# Patient Record
Sex: Female | Born: 1960 | Race: White | Hispanic: No | Smoking: Never smoker
Health system: Southern US, Community
[De-identification: ages and names within clinical notes are randomized; demographics above are authoritative.]

## PROBLEM LIST (undated history)

## (undated) DIAGNOSIS — T4145XA Adverse effect of unspecified anesthetic, initial encounter: Secondary | ICD-10-CM

## (undated) DIAGNOSIS — F329 Major depressive disorder, single episode, unspecified: Secondary | ICD-10-CM

## (undated) DIAGNOSIS — K7200 Acute and subacute hepatic failure without coma: Secondary | ICD-10-CM

## (undated) DIAGNOSIS — F32A Depression, unspecified: Secondary | ICD-10-CM

## (undated) DIAGNOSIS — Z8489 Family history of other specified conditions: Secondary | ICD-10-CM

## (undated) DIAGNOSIS — Z8709 Personal history of other diseases of the respiratory system: Secondary | ICD-10-CM

## (undated) DIAGNOSIS — R200 Anesthesia of skin: Secondary | ICD-10-CM

## (undated) DIAGNOSIS — M797 Fibromyalgia: Secondary | ICD-10-CM

## (undated) DIAGNOSIS — Z9889 Other specified postprocedural states: Secondary | ICD-10-CM

## (undated) DIAGNOSIS — F419 Anxiety disorder, unspecified: Secondary | ICD-10-CM

## (undated) DIAGNOSIS — G629 Polyneuropathy, unspecified: Secondary | ICD-10-CM

## (undated) DIAGNOSIS — D649 Anemia, unspecified: Secondary | ICD-10-CM

## (undated) DIAGNOSIS — R112 Nausea with vomiting, unspecified: Secondary | ICD-10-CM

## (undated) DIAGNOSIS — N289 Disorder of kidney and ureter, unspecified: Secondary | ICD-10-CM

## (undated) DIAGNOSIS — R0600 Dyspnea, unspecified: Secondary | ICD-10-CM

## (undated) DIAGNOSIS — D759 Disease of blood and blood-forming organs, unspecified: Secondary | ICD-10-CM

## (undated) DIAGNOSIS — Z9289 Personal history of other medical treatment: Secondary | ICD-10-CM

## (undated) DIAGNOSIS — R51 Headache: Secondary | ICD-10-CM

## (undated) DIAGNOSIS — R519 Headache, unspecified: Secondary | ICD-10-CM

## (undated) DIAGNOSIS — Z862 Personal history of diseases of the blood and blood-forming organs and certain disorders involving the immune mechanism: Secondary | ICD-10-CM

## (undated) DIAGNOSIS — M1990 Unspecified osteoarthritis, unspecified site: Secondary | ICD-10-CM

## (undated) HISTORY — PX: BACK SURGERY: SHX140

## (undated) HISTORY — PX: TONSILLECTOMY: SUR1361

## (undated) HISTORY — PX: DILATION AND CURETTAGE OF UTERUS: SHX78

## (undated) HISTORY — PX: CERVICAL DISCECTOMY: SHX98

## (undated) HISTORY — PX: SHOULDER SURGERY: SHX246

## (undated) HISTORY — PX: ABDOMINAL HYSTERECTOMY: SHX81

## (undated) HISTORY — DX: Depression, unspecified: F32.A

## (undated) HISTORY — DX: Major depressive disorder, single episode, unspecified: F32.9

## (undated) HISTORY — PX: OTHER SURGICAL HISTORY: SHX169

## (undated) HISTORY — DX: Disorder of kidney and ureter, unspecified: N28.9

---

## 1987-05-18 HISTORY — PX: FOOT SURGERY: SHX648

## 1997-11-16 ENCOUNTER — Emergency Department (HOSPITAL_COMMUNITY): Admission: EM | Admit: 1997-11-16 | Discharge: 1997-11-16 | Payer: Self-pay | Admitting: Emergency Medicine

## 1999-03-07 ENCOUNTER — Emergency Department (HOSPITAL_COMMUNITY): Admission: EM | Admit: 1999-03-07 | Discharge: 1999-03-07 | Payer: Self-pay | Admitting: Emergency Medicine

## 1999-07-05 ENCOUNTER — Ambulatory Visit (HOSPITAL_COMMUNITY): Admission: RE | Admit: 1999-07-05 | Discharge: 1999-07-05 | Payer: Self-pay | Admitting: Neurosurgery

## 1999-11-23 ENCOUNTER — Inpatient Hospital Stay (HOSPITAL_COMMUNITY): Admission: RE | Admit: 1999-11-23 | Discharge: 1999-11-27 | Payer: Self-pay | Admitting: Neurosurgery

## 1999-12-02 ENCOUNTER — Encounter: Payer: Self-pay | Admitting: Neurosurgery

## 1999-12-02 ENCOUNTER — Encounter: Payer: Self-pay | Admitting: Emergency Medicine

## 1999-12-02 ENCOUNTER — Inpatient Hospital Stay (HOSPITAL_COMMUNITY): Admission: EM | Admit: 1999-12-02 | Discharge: 1999-12-10 | Payer: Self-pay | Admitting: Emergency Medicine

## 2000-01-21 ENCOUNTER — Inpatient Hospital Stay (HOSPITAL_COMMUNITY): Admission: RE | Admit: 2000-01-21 | Discharge: 2000-01-23 | Payer: Self-pay | Admitting: Neurosurgery

## 2000-01-23 ENCOUNTER — Emergency Department (HOSPITAL_COMMUNITY): Admission: EM | Admit: 2000-01-23 | Discharge: 2000-01-23 | Payer: Self-pay | Admitting: Emergency Medicine

## 2000-03-15 ENCOUNTER — Ambulatory Visit (HOSPITAL_COMMUNITY): Admission: RE | Admit: 2000-03-15 | Discharge: 2000-03-15 | Payer: Self-pay | Admitting: Neurosurgery

## 2000-05-31 ENCOUNTER — Encounter: Admission: RE | Admit: 2000-05-31 | Discharge: 2000-05-31 | Payer: Self-pay | Admitting: Internal Medicine

## 2000-11-29 ENCOUNTER — Encounter: Admission: RE | Admit: 2000-11-29 | Discharge: 2000-11-29 | Payer: Self-pay | Admitting: Neurosurgery

## 2001-04-22 ENCOUNTER — Emergency Department (HOSPITAL_COMMUNITY): Admission: EM | Admit: 2001-04-22 | Discharge: 2001-04-23 | Payer: Self-pay | Admitting: Emergency Medicine

## 2001-04-23 ENCOUNTER — Encounter: Payer: Self-pay | Admitting: Emergency Medicine

## 2001-04-26 ENCOUNTER — Ambulatory Visit (HOSPITAL_COMMUNITY): Admission: RE | Admit: 2001-04-26 | Discharge: 2001-04-26 | Payer: Self-pay | Admitting: Family Medicine

## 2001-04-26 ENCOUNTER — Encounter: Payer: Self-pay | Admitting: Family Medicine

## 2001-05-19 ENCOUNTER — Ambulatory Visit (HOSPITAL_COMMUNITY): Admission: RE | Admit: 2001-05-19 | Discharge: 2001-05-19 | Payer: Self-pay | Admitting: Neurosurgery

## 2001-05-24 ENCOUNTER — Other Ambulatory Visit: Admission: RE | Admit: 2001-05-24 | Discharge: 2001-05-24 | Payer: Self-pay | Admitting: Obstetrics and Gynecology

## 2003-01-23 ENCOUNTER — Ambulatory Visit (HOSPITAL_COMMUNITY): Admission: RE | Admit: 2003-01-23 | Discharge: 2003-01-25 | Payer: Self-pay | Admitting: Neurosurgery

## 2004-02-27 ENCOUNTER — Ambulatory Visit (HOSPITAL_COMMUNITY): Admission: RE | Admit: 2004-02-27 | Discharge: 2004-02-27 | Payer: Self-pay | Admitting: Neurosurgery

## 2004-03-13 ENCOUNTER — Other Ambulatory Visit: Payer: Self-pay

## 2004-03-13 ENCOUNTER — Emergency Department: Payer: Self-pay | Admitting: Emergency Medicine

## 2004-03-14 ENCOUNTER — Other Ambulatory Visit: Payer: Self-pay

## 2004-03-30 ENCOUNTER — Ambulatory Visit (HOSPITAL_COMMUNITY): Admission: RE | Admit: 2004-03-30 | Discharge: 2004-03-31 | Payer: Self-pay | Admitting: Neurosurgery

## 2004-04-06 ENCOUNTER — Ambulatory Visit (HOSPITAL_COMMUNITY): Admission: RE | Admit: 2004-04-06 | Discharge: 2004-04-06 | Payer: Self-pay | Admitting: Neurosurgery

## 2004-04-13 ENCOUNTER — Ambulatory Visit (HOSPITAL_COMMUNITY): Admission: RE | Admit: 2004-04-13 | Discharge: 2004-04-13 | Payer: Self-pay | Admitting: Neurosurgery

## 2004-04-24 ENCOUNTER — Ambulatory Visit (HOSPITAL_COMMUNITY): Admission: RE | Admit: 2004-04-24 | Discharge: 2004-04-24 | Payer: Self-pay | Admitting: Neurosurgery

## 2004-06-17 ENCOUNTER — Encounter: Admission: RE | Admit: 2004-06-17 | Discharge: 2004-07-29 | Payer: Self-pay | Admitting: Neurosurgery

## 2004-07-08 ENCOUNTER — Ambulatory Visit (HOSPITAL_COMMUNITY): Admission: RE | Admit: 2004-07-08 | Discharge: 2004-07-08 | Payer: Self-pay | Admitting: Neurosurgery

## 2004-08-07 ENCOUNTER — Ambulatory Visit (HOSPITAL_COMMUNITY): Admission: RE | Admit: 2004-08-07 | Discharge: 2004-08-07 | Payer: Self-pay | Admitting: Neurosurgery

## 2004-08-12 ENCOUNTER — Encounter: Admission: RE | Admit: 2004-08-12 | Discharge: 2004-08-12 | Payer: Self-pay | Admitting: Neurosurgery

## 2004-08-27 ENCOUNTER — Ambulatory Visit (HOSPITAL_COMMUNITY): Admission: RE | Admit: 2004-08-27 | Discharge: 2004-08-27 | Payer: Self-pay | Admitting: Orthopedic Surgery

## 2004-09-10 ENCOUNTER — Inpatient Hospital Stay (HOSPITAL_COMMUNITY): Admission: RE | Admit: 2004-09-10 | Discharge: 2004-09-17 | Payer: Self-pay | Admitting: Neurosurgery

## 2004-12-21 ENCOUNTER — Encounter: Admission: RE | Admit: 2004-12-21 | Discharge: 2005-01-29 | Payer: Self-pay | Admitting: Orthopedic Surgery

## 2005-04-15 ENCOUNTER — Ambulatory Visit (HOSPITAL_COMMUNITY): Admission: RE | Admit: 2005-04-15 | Discharge: 2005-04-15 | Payer: Self-pay | Admitting: Neurosurgery

## 2005-05-24 ENCOUNTER — Ambulatory Visit: Payer: Self-pay | Admitting: Physical Medicine & Rehabilitation

## 2005-05-24 ENCOUNTER — Encounter
Admission: RE | Admit: 2005-05-24 | Discharge: 2005-08-22 | Payer: Self-pay | Admitting: Physical Medicine & Rehabilitation

## 2005-08-01 ENCOUNTER — Emergency Department: Payer: Self-pay | Admitting: Emergency Medicine

## 2006-08-30 ENCOUNTER — Ambulatory Visit (HOSPITAL_COMMUNITY): Admission: RE | Admit: 2006-08-30 | Discharge: 2006-08-30 | Payer: Self-pay | Admitting: Neurosurgery

## 2006-09-29 ENCOUNTER — Ambulatory Visit (HOSPITAL_COMMUNITY): Admission: RE | Admit: 2006-09-29 | Discharge: 2006-09-29 | Payer: Self-pay | Admitting: Neurosurgery

## 2007-01-11 ENCOUNTER — Inpatient Hospital Stay (HOSPITAL_COMMUNITY): Admission: RE | Admit: 2007-01-11 | Discharge: 2007-01-16 | Payer: Self-pay | Admitting: Neurosurgery

## 2007-07-28 ENCOUNTER — Encounter: Admission: RE | Admit: 2007-07-28 | Discharge: 2007-07-28 | Payer: Self-pay | Admitting: Neurosurgery

## 2008-01-25 ENCOUNTER — Ambulatory Visit (HOSPITAL_COMMUNITY): Admission: RE | Admit: 2008-01-25 | Discharge: 2008-01-25 | Payer: Self-pay | Admitting: Neurosurgery

## 2008-03-25 ENCOUNTER — Ambulatory Visit: Payer: Self-pay | Admitting: Hematology & Oncology

## 2008-05-14 ENCOUNTER — Ambulatory Visit: Payer: Self-pay | Admitting: Hematology & Oncology

## 2008-05-15 LAB — CBC WITH DIFFERENTIAL (CANCER CENTER ONLY)
BASO#: 0.1 10*3/uL (ref 0.0–0.2)
BASO%: 0.5 % (ref 0.0–2.0)
EOS%: 2.9 % (ref 0.0–7.0)
HCT: 22.9 % — ABNORMAL LOW (ref 34.8–46.6)
HGB: 7 g/dL — CL (ref 11.6–15.9)
MCH: 18.3 pg — ABNORMAL LOW (ref 26.0–34.0)
MCHC: 30.5 g/dL — ABNORMAL LOW (ref 32.0–36.0)
MONO%: 10.1 % (ref 0.0–13.0)
NEUT%: 57 % (ref 39.6–80.0)
RDW: 16.9 % — ABNORMAL HIGH (ref 10.5–14.6)

## 2008-05-16 LAB — RETICULOCYTES (CHCC)
ABS Retic: 51.1 10*3/uL (ref 19.0–186.0)
RBC.: 3.93 MIL/uL (ref 3.87–5.11)

## 2008-05-16 LAB — COMPREHENSIVE METABOLIC PANEL
ALT: 18 U/L (ref 0–35)
AST: 22 U/L (ref 0–37)
Albumin: 3.8 g/dL (ref 3.5–5.2)
Alkaline Phosphatase: 124 U/L — ABNORMAL HIGH (ref 39–117)
Calcium: 8.4 mg/dL (ref 8.4–10.5)
Chloride: 98 mEq/L (ref 96–112)
Potassium: 3.3 mEq/L — ABNORMAL LOW (ref 3.5–5.3)
Sodium: 133 mEq/L — ABNORMAL LOW (ref 135–145)

## 2008-05-16 LAB — LACTATE DEHYDROGENASE: LDH: 184 U/L (ref 94–250)

## 2008-05-16 LAB — VITAMIN B12: Vitamin B-12: 345 pg/mL (ref 211–911)

## 2008-05-17 HISTORY — PX: OTHER SURGICAL HISTORY: SHX169

## 2008-07-10 ENCOUNTER — Ambulatory Visit: Payer: Self-pay | Admitting: Hematology & Oncology

## 2008-07-11 LAB — CBC WITH DIFFERENTIAL (CANCER CENTER ONLY)
BASO#: 0.1 10*3/uL (ref 0.0–0.2)
BASO%: 0.7 % (ref 0.0–2.0)
EOS%: 3 % (ref 0.0–7.0)
HCT: 36.7 % (ref 34.8–46.6)
HGB: 11.5 g/dL — ABNORMAL LOW (ref 11.6–15.9)
LYMPH#: 2.9 10*3/uL (ref 0.9–3.3)
LYMPH%: 36.1 % (ref 14.0–48.0)
MCHC: 31.3 g/dL — ABNORMAL LOW (ref 32.0–36.0)
MCV: 80 fL — ABNORMAL LOW (ref 81–101)
MONO#: 0.9 10*3/uL (ref 0.1–0.9)
NEUT%: 48.9 % (ref 39.6–80.0)
RDW: 21.4 % — ABNORMAL HIGH (ref 10.5–14.6)

## 2008-07-11 LAB — CHCC SATELLITE - SMEAR

## 2008-07-14 LAB — RETICULOCYTES (CHCC)
ABS Retic: 51.7 10*3/uL (ref 19.0–186.0)
RBC.: 4.7 MIL/uL (ref 3.87–5.11)
Retic Ct Pct: 1.1 % (ref 0.4–3.1)

## 2008-07-16 ENCOUNTER — Ambulatory Visit (HOSPITAL_COMMUNITY): Admission: RE | Admit: 2008-07-16 | Discharge: 2008-07-16 | Payer: Self-pay | Admitting: Obstetrics and Gynecology

## 2008-08-15 ENCOUNTER — Ambulatory Visit (HOSPITAL_COMMUNITY): Admission: RE | Admit: 2008-08-15 | Discharge: 2008-08-15 | Payer: Self-pay | Admitting: Obstetrics and Gynecology

## 2008-08-15 ENCOUNTER — Encounter (INDEPENDENT_AMBULATORY_CARE_PROVIDER_SITE_OTHER): Payer: Self-pay | Admitting: Obstetrics and Gynecology

## 2008-09-04 ENCOUNTER — Ambulatory Visit: Payer: Self-pay | Admitting: Hematology & Oncology

## 2008-09-06 ENCOUNTER — Ambulatory Visit (HOSPITAL_COMMUNITY): Admission: RE | Admit: 2008-09-06 | Discharge: 2008-09-06 | Payer: Self-pay | Admitting: Neurosurgery

## 2008-12-30 ENCOUNTER — Inpatient Hospital Stay (HOSPITAL_COMMUNITY): Admission: RE | Admit: 2008-12-30 | Discharge: 2009-01-01 | Payer: Self-pay | Admitting: Neurosurgery

## 2009-01-03 ENCOUNTER — Inpatient Hospital Stay (HOSPITAL_COMMUNITY): Admission: EM | Admit: 2009-01-03 | Discharge: 2009-01-05 | Payer: Self-pay | Admitting: Emergency Medicine

## 2009-01-03 ENCOUNTER — Ambulatory Visit: Payer: Self-pay | Admitting: Hematology & Oncology

## 2009-05-17 DIAGNOSIS — T8859XA Other complications of anesthesia, initial encounter: Secondary | ICD-10-CM

## 2009-05-17 HISTORY — PX: HERNIA REPAIR: SHX51

## 2009-05-17 HISTORY — DX: Other complications of anesthesia, initial encounter: T88.59XA

## 2009-06-25 ENCOUNTER — Emergency Department: Payer: Self-pay | Admitting: Emergency Medicine

## 2009-07-10 ENCOUNTER — Ambulatory Visit: Payer: Self-pay | Admitting: Family Medicine

## 2009-07-18 ENCOUNTER — Encounter (INDEPENDENT_AMBULATORY_CARE_PROVIDER_SITE_OTHER): Payer: Self-pay | Admitting: Orthopedic Surgery

## 2009-07-18 ENCOUNTER — Ambulatory Visit: Payer: Self-pay | Admitting: Vascular Surgery

## 2009-07-18 ENCOUNTER — Ambulatory Visit (HOSPITAL_COMMUNITY): Admission: RE | Admit: 2009-07-18 | Discharge: 2009-07-18 | Payer: Self-pay | Admitting: Family Medicine

## 2009-07-25 ENCOUNTER — Encounter: Payer: Self-pay | Admitting: Cardiovascular Disease

## 2009-09-18 ENCOUNTER — Ambulatory Visit (HOSPITAL_COMMUNITY): Admission: RE | Admit: 2009-09-18 | Discharge: 2009-09-18 | Payer: Self-pay | Admitting: Gastroenterology

## 2009-12-02 ENCOUNTER — Ambulatory Visit (HOSPITAL_COMMUNITY): Admission: RE | Admit: 2009-12-02 | Discharge: 2009-12-02 | Payer: Self-pay | Admitting: General Surgery

## 2010-03-04 ENCOUNTER — Encounter: Admission: RE | Admit: 2010-03-04 | Discharge: 2010-03-04 | Payer: Self-pay | Admitting: Neurosurgery

## 2010-06-06 ENCOUNTER — Encounter: Payer: Self-pay | Admitting: Neurosurgery

## 2010-07-08 ENCOUNTER — Ambulatory Visit (HOSPITAL_COMMUNITY)
Admission: RE | Admit: 2010-07-08 | Discharge: 2010-07-08 | Disposition: A | Discharge status: Home / Self Care | Payer: Medicare Other | Source: Ambulatory Visit | Attending: Family Medicine | Admitting: Family Medicine

## 2010-07-08 DIAGNOSIS — R0602 Shortness of breath: Secondary | ICD-10-CM | POA: Insufficient documentation

## 2010-07-29 ENCOUNTER — Inpatient Hospital Stay (HOSPITAL_COMMUNITY)
Admission: AD | Admit: 2010-07-29 | Discharge: 2010-07-30 | Disposition: A | Discharge status: Home / Self Care | Payer: Medicare Other | Source: Ambulatory Visit | Attending: Obstetrics and Gynecology | Admitting: Obstetrics and Gynecology

## 2010-07-29 DIAGNOSIS — N949 Unspecified condition associated with female genital organs and menstrual cycle: Secondary | ICD-10-CM

## 2010-07-29 DIAGNOSIS — Z30432 Encounter for removal of intrauterine contraceptive device: Secondary | ICD-10-CM

## 2010-08-01 LAB — DIFFERENTIAL
Basophils Relative: 1 % (ref 0–1)
Eosinophils Absolute: 0.2 10*3/uL (ref 0.0–0.7)
Monocytes Absolute: 1.4 10*3/uL — ABNORMAL HIGH (ref 0.1–1.0)
Monocytes Relative: 13 % — ABNORMAL HIGH (ref 3–12)
Neutrophils Relative %: 64 % (ref 43–77)

## 2010-08-01 LAB — CBC
HCT: 36.7 % (ref 36.0–46.0)
Hemoglobin: 11.8 g/dL — ABNORMAL LOW (ref 12.0–15.0)
MCH: 25.8 pg — ABNORMAL LOW (ref 26.0–34.0)
MCHC: 32.3 g/dL (ref 30.0–36.0)
RDW: 14.1 % (ref 11.5–15.5)

## 2010-08-01 LAB — HCG, SERUM, QUALITATIVE: Preg, Serum: NEGATIVE

## 2010-08-01 LAB — BASIC METABOLIC PANEL
BUN: 10 mg/dL (ref 6–23)
CO2: 31 mEq/L (ref 19–32)
Calcium: 8.7 mg/dL (ref 8.4–10.5)
Glucose, Bld: 94 mg/dL (ref 70–99)
Sodium: 139 mEq/L (ref 135–145)

## 2010-08-01 LAB — HEPATIC FUNCTION PANEL
Bilirubin, Direct: 0.2 mg/dL (ref 0.0–0.3)
Total Bilirubin: 0.3 mg/dL (ref 0.3–1.2)

## 2010-08-01 LAB — SURGICAL PCR SCREEN: Staphylococcus aureus: NEGATIVE

## 2010-08-05 ENCOUNTER — Other Ambulatory Visit: Payer: Self-pay | Admitting: Hematology & Oncology

## 2010-08-05 ENCOUNTER — Encounter (HOSPITAL_BASED_OUTPATIENT_CLINIC_OR_DEPARTMENT_OTHER): Payer: Medicare Other | Admitting: Hematology & Oncology

## 2010-08-05 DIAGNOSIS — N920 Excessive and frequent menstruation with regular cycle: Secondary | ICD-10-CM

## 2010-08-05 DIAGNOSIS — D509 Iron deficiency anemia, unspecified: Secondary | ICD-10-CM

## 2010-08-05 DIAGNOSIS — D638 Anemia in other chronic diseases classified elsewhere: Secondary | ICD-10-CM

## 2010-08-05 LAB — CBC WITH DIFFERENTIAL (CANCER CENTER ONLY)
BASO#: 0 10*3/uL (ref 0.0–0.2)
EOS%: 2.3 % (ref 0.0–7.0)
Eosinophils Absolute: 0.2 10*3/uL (ref 0.0–0.5)
HGB: 8.7 g/dL — ABNORMAL LOW (ref 11.6–15.9)
LYMPH#: 2 10*3/uL (ref 0.9–3.3)
MCHC: 30.2 g/dL — ABNORMAL LOW (ref 32.0–36.0)
NEUT#: 4.6 10*3/uL (ref 1.5–6.5)
RBC: 3.81 10*6/uL (ref 3.70–5.32)
WBC: 7.8 10*3/uL (ref 3.9–10.0)

## 2010-08-10 ENCOUNTER — Telehealth: Payer: Self-pay | Admitting: Emergency Medicine

## 2010-08-10 ENCOUNTER — Encounter (HOSPITAL_COMMUNITY)
Admission: RE | Admit: 2010-08-10 | Discharge: 2010-08-10 | Disposition: A | Discharge status: Home / Self Care | Payer: Medicare Other | Source: Ambulatory Visit | Attending: Obstetrics and Gynecology | Admitting: Obstetrics and Gynecology

## 2010-08-10 DIAGNOSIS — Z01810 Encounter for preprocedural cardiovascular examination: Secondary | ICD-10-CM | POA: Insufficient documentation

## 2010-08-10 DIAGNOSIS — Z01812 Encounter for preprocedural laboratory examination: Secondary | ICD-10-CM | POA: Insufficient documentation

## 2010-08-10 LAB — SURGICAL PCR SCREEN
MRSA, PCR: NEGATIVE
Staphylococcus aureus: NEGATIVE

## 2010-08-10 NOTE — Telephone Encounter (Signed)
LMOM for [name] TCB

## 2010-08-11 NOTE — Telephone Encounter (Signed)
lmomtcb  Mindy Silva, CMA  

## 2010-08-11 NOTE — Telephone Encounter (Signed)
Spoke with Clydie Braun.  Per Clydie Braun, pt scheduled for LAVH and BSO on April 3.  States anesthesiologist is requesting pulmonary referral.  Has pending consult with Dr. Delton Coombes but he is not back in the office until after surgery is scheduled for.  Per TD, may add on to Dr. Thurston Hole schedule on 08/13/10 at 9:15.    Clydie Braun aware pt will be worked in with Dr. Sherene Sires on March 29 at 9:15.  She is aware to ask pt to arrive 15 minutes early, bring ALL medications (not a list) - as needed and maintance to appt.  She will inform pt of this. She will also fax records to main fax # - Attn: Leslie/Dr. Sherene Sires.

## 2010-08-11 NOTE — Telephone Encounter (Signed)
Clydie Braun returning call again.  She asked if you get her voicemail again please press  0 and have her paged.

## 2010-08-11 NOTE — Telephone Encounter (Signed)
[name] returning triage's call.

## 2010-08-13 ENCOUNTER — Other Ambulatory Visit (INDEPENDENT_AMBULATORY_CARE_PROVIDER_SITE_OTHER): Payer: Medicare Other

## 2010-08-13 ENCOUNTER — Ambulatory Visit (INDEPENDENT_AMBULATORY_CARE_PROVIDER_SITE_OTHER): Payer: Medicare Other | Admitting: Internal Medicine

## 2010-08-13 ENCOUNTER — Encounter: Payer: Self-pay | Admitting: Internal Medicine

## 2010-08-13 ENCOUNTER — Ambulatory Visit (INDEPENDENT_AMBULATORY_CARE_PROVIDER_SITE_OTHER)
Admission: RE | Admit: 2010-08-13 | Discharge: 2010-08-13 | Disposition: A | Discharge status: Home / Self Care | Payer: Medicare Other | Source: Ambulatory Visit | Attending: Internal Medicine | Admitting: Internal Medicine

## 2010-08-13 DIAGNOSIS — F329 Major depressive disorder, single episode, unspecified: Secondary | ICD-10-CM

## 2010-08-13 DIAGNOSIS — R0602 Shortness of breath: Secondary | ICD-10-CM

## 2010-08-13 DIAGNOSIS — N926 Irregular menstruation, unspecified: Secondary | ICD-10-CM

## 2010-08-13 DIAGNOSIS — F32A Depression, unspecified: Secondary | ICD-10-CM | POA: Insufficient documentation

## 2010-08-13 DIAGNOSIS — N939 Abnormal uterine and vaginal bleeding, unspecified: Secondary | ICD-10-CM

## 2010-08-13 DIAGNOSIS — F3289 Other specified depressive episodes: Secondary | ICD-10-CM

## 2010-08-13 DIAGNOSIS — D649 Anemia, unspecified: Secondary | ICD-10-CM

## 2010-08-13 DIAGNOSIS — N289 Disorder of kidney and ureter, unspecified: Secondary | ICD-10-CM

## 2010-08-13 LAB — BRAIN NATRIURETIC PEPTIDE: Pro B Natriuretic peptide (BNP): 135.8 pg/mL — ABNORMAL HIGH (ref 0.0–100.0)

## 2010-08-13 LAB — BASIC METABOLIC PANEL
Calcium: 8.1 mg/dL — ABNORMAL LOW (ref 8.4–10.5)
Creatinine, Ser: 0.7 mg/dL (ref 0.4–1.2)
GFR: 97.93 mL/min (ref >60.00)

## 2010-08-13 LAB — TSH: TSH: 4.47 u[IU]/mL (ref 0.35–5.50)

## 2010-08-13 MED ORDER — FAMOTIDINE 20 MG PO TABS: 20.0000 mg | ORAL_TABLET | Freq: Every day | ORAL | Status: DC

## 2010-08-13 MED ORDER — PANTOPRAZOLE SODIUM 40 MG PO TBEC: 40.0000 mg | DELAYED_RELEASE_TABLET | Freq: Every day | ORAL | Status: DC

## 2010-08-13 NOTE — Patient Instructions (Addendum)
Try protonix (pantoprazole) Take 30-60 min before first meal of the day and Pepcid 20 mg at bedtime  GERD (REFLUX)  is an extremely common cause of respiratory symptoms, many times with no significant heartburn at all.    It can be treated with medication, but also with lifestyle changes including avoidance of late meals, excessive alcohol, smoking cessation, and avoid fatty foods, chocolate, peppermint, colas, red wine, and acidic juices such as orange juice.  NO MINT OR MENTHOL PRODUCTS SO NO COUGH DROPS  USE SUGARLESS CANDY INSTEAD (jolley ranchers or Stover's)  NO OIL BASED VITAMINS    We will call you to schedule CT Chest  > will approve you for surgery p we review this  There is no pulmonary contraindication to surgery but you must be mobilized as soon as possible with maximum efforts to reduce effects of weight on your diaphragm and prevent blood clots

## 2010-08-13 NOTE — Progress Notes (Signed)
  Subjective:    Patient ID: [name], female    DOB: [birth year], 50 y.o.   MRN: [id number]  HPI 57 yowf never smoker with h/o hives to bees and fish "throat closes up" rx with epipen in past.  08/13/2010 Initial pulmonary office eval  For ex tol good in 1992 with back surgery eventually disabled in 2008 new cc 8 months indolent onset doe  X  Walking across trailer 75 ft and also 3 months sob sitting still and lying down, better sitting if leaning forward.  Better on albuterol tablets.  No assoc cough.  No major change in wt related to new symptoms. Cardiac w/u neg per Nasher per pt.   Pt denies any significant sore throat, dysphagia, itching, sneezing,  nasal congestion or excess/ purulent secretions,  fever, chills, sweats, unintended wt loss, pleuritic or exertional cp, hempoptysis, orthopnea pnd or leg swelling.    Also denies any obvious fluctuation of symptoms with weather or environmental changes or other aggravating or alleviating factors.   Hgb varies but does not correlate with doe     PMHx "Bone Marrow Dz" .......................................................Marland KitchenEnnever Morbid Obeisty        -  PFT's 07/08/10  VC  63%  DLC0 89% Unexplained sob .......................................................Marland KitchenWert       - Desats < 50 ft 08/13/2010 > CT Chest ordered >>>   Review of Systems  Constitutional: Positive for unexpected weight change. Negative for fever and chills.  HENT: Negative for ear pain, nosebleeds, congestion, sore throat, rhinorrhea, sneezing, trouble swallowing, dental problem, voice change, postnasal drip and sinus pressure.   Eyes: Negative for visual disturbance.  Respiratory: Positive for shortness of breath. Negative for cough and choking.   Cardiovascular: Negative for chest pain and leg swelling.  Gastrointestinal: Negative for vomiting, abdominal pain and diarrhea.  Genitourinary: Negative for difficulty urinating.  Musculoskeletal: Positive for  arthralgias.  Skin: Negative for rash.  Neurological: Negative for tremors, syncope and headaches.  Hematological: Bruises/bleeds easily.  Psychiatric/Behavioral: The patient is nervous/anxious.        Objective:   Physical Exam  Obese anxious wf slt pale, nad.  Wt 284 08/13/2010   HEENT: nl dentition, turbinates, and orophanx. Nl external ear canals without cough reflex   NECK :  without JVD/Nodes/TM/ nl carotid upstrokes bilaterally   LUNGS: no acc muscle use, clear to A and P bilaterally without cough on insp or exp maneuvers   CV:  RRR  no s3 or murmur or increase in P2, no edema   ABD: massively obese but soft and nontender with very limited  excursion in the supine position. No bruits or organomegaly, bowel sounds nl  MS:  warm without deformities, calf tenderness, cyanosis or clubbing  SKIN: warm and dry without lesions    NEURO:  alert, approp, no deficits       Assessment & Plan:  Nl bmet , bnp and tsh 08/13/2010  CT angiogram of chest ordered

## 2010-08-13 NOTE — Assessment & Plan Note (Addendum)
Restrictive pft's 06/2010 reviewed with pt and risk of surgery noted

## 2010-08-13 NOTE — Assessment & Plan Note (Addendum)
Mostly obesity related but high risk gerd and pe  Discussed in detail all the  indications, usual  risks and alternatives  relative to the benefits with patient who agrees to proceed with surgery unless acute findings on ct chest.    In meantime go ahead and try gerd rx/ diet.   The differential diagnosis of difficult to control airways disorders is extensive with no quick   answers but easy to remember because it consists of 11 A's,  Two Bs  And a C 1. Adherence, always a challenge and the leading suspect.  2. Acid reflux disease, with the greater proportion of pulmonary patients with no overt heartburn symptoms, and no easy way to treat non-acid reflux >>> so go ahead and start rx here. 3. Ace inhibitor use, not relevant here 4. Active sinus dz, best addressed by a sinus ct 5. Active smoking,  Usually sureptitious in this setting 6. Allergic diseases,   more of a challenge in younger patients and assoc with prominent allergic rhinitis features missing here  7. Aspiration, a perennial problem in the elderly or other patients at risk 8. 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 Alpha one Antitrypsin deficiency, a must screen in patients with chronic airflow obstruction syndromes out of proportion to smoking history. 10.Adverse effect of inhalers, especially DPI's and especially with poor inhaler technique >not relevant here. 11 Anxiety, always a diagnosis of exclusion but may be partly relevant here.  Two B's Bronchiectasis and Beta blocker effects One C Congestive heart failure, nicely ruled out now with BNP minimally above 100 and neg w/u by Dr Melburn Popper

## 2010-08-13 NOTE — Progress Notes (Signed)
Quick Note:  Pt aware of results/recs per MW. Pt verbalized understanding. ______

## 2010-08-14 ENCOUNTER — Encounter: Payer: Self-pay | Admitting: Internal Medicine

## 2010-08-14 ENCOUNTER — Telehealth: Payer: Self-pay | Admitting: Internal Medicine

## 2010-08-14 ENCOUNTER — Ambulatory Visit (INDEPENDENT_AMBULATORY_CARE_PROVIDER_SITE_OTHER)
Admission: RE | Admit: 2010-08-14 | Discharge: 2010-08-14 | Disposition: A | Discharge status: Home / Self Care | Payer: Medicare Other | Source: Ambulatory Visit | Attending: Internal Medicine | Admitting: Internal Medicine

## 2010-08-14 DIAGNOSIS — R0602 Shortness of breath: Secondary | ICD-10-CM

## 2010-08-14 LAB — CBC
Platelets: 250 10*3/uL (ref 150–400)
RDW: 19.2 % — ABNORMAL HIGH (ref 11.5–15.5)
WBC: 7.8 10*3/uL (ref 4.0–10.5)

## 2010-08-14 LAB — COMPREHENSIVE METABOLIC PANEL
ALT: 17 U/L (ref 0–35)
Alkaline Phosphatase: 75 U/L (ref 39–117)
BUN: 6 mg/dL (ref 6–23)
CO2: 26 mEq/L (ref 19–32)
Chloride: 105 mEq/L (ref 96–112)
GFR calc non Af Amer: >60 mL/min (ref >60)
Glucose, Bld: 108 mg/dL — ABNORMAL HIGH (ref 70–99)
Potassium: 3.2 mEq/L — ABNORMAL LOW (ref 3.5–5.1)
Sodium: 137 mEq/L (ref 135–145)
Total Bilirubin: 0.4 mg/dL (ref 0.3–1.2)
Total Protein: 7 g/dL (ref 6.0–8.3)

## 2010-08-14 LAB — TYPE AND SCREEN
ABO/RH(D): A POS
Antibody Screen: NEGATIVE

## 2010-08-14 MED ORDER — IOHEXOL 300 MG/ML  SOLN
150.0000 mL | Freq: Once | INTRAMUSCULAR | Status: AC | PRN
  Administered 2010-08-14: 150 mL via INTRAVENOUS

## 2010-08-14 NOTE — Telephone Encounter (Signed)
Spoke with pt and she is requesting CT chest results. I advised that we will call her once this is read.  She verbalized understanding.  She wants to know why Dr. Sherene Sires did not start her on o2 at ov and if he thinks this might help.  Please advise thanks

## 2010-08-14 NOTE — Telephone Encounter (Signed)
Called pt - issue for right now is whether ok to do surgery - need recheck cbc and esr and review cardiac eval to decide re safety of surgery so asked here to return 08/17/10 for this

## 2010-08-14 NOTE — Telephone Encounter (Signed)
Per Bjorn Loser nothing to eat or drink 2 hrs prior to ct being done.  LMOMTCB inform pt of this

## 2010-08-14 NOTE — Telephone Encounter (Signed)
Pt states she doesn't need a return call.

## 2010-08-17 ENCOUNTER — Ambulatory Visit (INDEPENDENT_AMBULATORY_CARE_PROVIDER_SITE_OTHER): Payer: Medicare Other | Admitting: Internal Medicine

## 2010-08-17 ENCOUNTER — Other Ambulatory Visit (INDEPENDENT_AMBULATORY_CARE_PROVIDER_SITE_OTHER): Payer: Medicare Other

## 2010-08-17 ENCOUNTER — Encounter: Payer: Self-pay | Admitting: Internal Medicine

## 2010-08-17 VITALS — BP 116/76 | HR 83 | Temp 97.9°F

## 2010-08-17 DIAGNOSIS — R0602 Shortness of breath: Secondary | ICD-10-CM

## 2010-08-17 LAB — CBC WITH DIFFERENTIAL/PLATELET
Basophils Absolute: 0.1 10*3/uL (ref 0.0–0.1)
Eosinophils Relative: 2.5 % (ref 0.0–5.0)
HCT: 29.8 % — ABNORMAL LOW (ref 36.0–46.0)
Lymphs Abs: 1.6 10*3/uL (ref 0.7–4.0)
Monocytes Absolute: 0.8 10*3/uL (ref 0.1–1.0)
Monocytes Relative: 11.1 % (ref 3.0–12.0)
Neutrophils Relative %: 63.1 % (ref 43.0–77.0)
Platelets: 257 10*3/uL (ref 150.0–400.0)
RDW: 20.5 % — ABNORMAL HIGH (ref 11.5–14.6)
WBC: 7.1 10*3/uL (ref 4.5–10.5)

## 2010-08-17 LAB — SEDIMENTATION RATE: Sed Rate: 49 mm/hr — ABNORMAL HIGH (ref 0–22)

## 2010-08-17 MED ORDER — FUROSEMIDE 20 MG PO TABS: 20.0000 mg | ORAL_TABLET | Freq: Every day | ORAL | Status: DC

## 2010-08-17 NOTE — Patient Instructions (Signed)
Because you are short of breath at rest we need to cancel your surgery for now  We will call you with your lab results  Use Incentive spirometry every hour while awake  Return in one week for a cxr with your incentive spirometer in hand

## 2010-08-17 NOTE — Progress Notes (Signed)
  Subjective:    Patient ID: [name], female    DOB: 14-Jan-1961, 50 y.o.   MRN: [id number]  Shortness of Breath Pertinent negatives include no abdominal pain, chest pain, ear pain, fever, headaches, leg swelling, rash, rhinorrhea, sore throat or vomiting.   49 yowf never smoker with h/o hives to bees and fish "throat closes up" rx with epipen in past.  08/13/10 Initial  pulmonary office eval  For ex tol good in 1992 with back surgery eventually disabled in 2008 new cc 8 months indolent onset doe  X  Walking across trailer 75 ft and also 3 months sob sitting still and lying down, better sitting if leaning forward.  Better on albuterol tablets.  No assoc cough.  No major change in wt related to new symptoms. Cardiac w/u neg per Nasher per pt.      Hgb varies but does not correlate with doe    08/17/2010 ov to discuss ct and eval for surgery.  No cough.  Denies any acute changes in sob.  Pt denies any significant sore throat, dysphagia, itching, sneezing,  nasal congestion or excess/ purulent secretions,  fever, chills, sweats, unintended wt loss, pleuritic or exertional cp, hempoptysis, orthopnea pnd or leg swelling.    Also denies any obvious fluctuation of symptoms with weather or environmental changes or other aggravating or alleviating factors.  No sign change in chronic myalgias/ arthralgias   PMHx "Bone Marrow Dz" .......................................................Marland KitchenEnnever Morbid Obeisty        -  PFT's 07/08/10  VC  63%  DLC0 89% Unexplained sob .......................................................Marland KitchenWert         -Desats p 50 ft walking 08/14/10         -CT Chest 08/14/10 c/w edema         - ESR 49 08/17/2010     Review of Systems  Constitutional: Positive for unexpected weight change. Negative for fever and chills.  HENT: Negative for ear pain, nosebleeds, congestion, sore throat, rhinorrhea, sneezing, trouble swallowing, dental problem, voice change, postnasal drip and  sinus pressure.   Eyes: Negative for visual disturbance.  Respiratory: Positive for shortness of breath. Negative for cough and choking.   Cardiovascular: Negative for chest pain and leg swelling.  Gastrointestinal: Negative for vomiting, abdominal pain and diarrhea.  Genitourinary: Negative for difficulty urinating.  Musculoskeletal: Positive for arthralgias.  Skin: Negative for rash.  Neurological: Negative for tremors, syncope and headaches.  Hematological: Bruises/bleeds easily.  Psychiatric/Behavioral: The patient is nervous/anxious.        Objective:   Physical Exam  Obese w/c bound wf nad  HEENT: nl dentition, turbinates, and orophanx. Nl external ear canals without cough reflex   NECK :  without JVD/Nodes/TM/ nl carotid upstrokes bilaterally   LUNGS: no acc muscle use, clear to A and P bilaterally without cough on insp or exp maneuvers   CV:  RRR  no s3 or murmur or increase in P2, no edema   ABD: massively obese but soft and nontender with very limited  excursion in the supine position. No bruits or organomegaly, bowel sounds nl  MS:  warm without deformities, calf tenderness, cyanosis or clubbing  SKIN: warm and dry without lesions    NEURO:  alert, approp, no deficits       Assessment & Plan:  Nl bmet , bnp and tsh 08/17/2010  CT angiogram of chest ordered

## 2010-08-17 NOTE — Assessment & Plan Note (Signed)
Unclear what's causing the infiltrates or sob/ desat but clearly not a good candidate for elective surgery with episodes of resting sob at rest sitting upright.  Will try diuresis for now.  Needs Echo preop despite relatively nl bnp looking for diastolic dysfunction/ pah

## 2010-08-19 NOTE — Progress Notes (Signed)
Quick Note:  Spoke with pt and notified of results pre Dr. Sherene Sires. Pt verbalized understanding. ______

## 2010-08-20 ENCOUNTER — Ambulatory Visit (HOSPITAL_COMMUNITY): Payer: Medicare Other | Attending: Internal Medicine | Admitting: Radiology

## 2010-08-20 ENCOUNTER — Other Ambulatory Visit (HOSPITAL_COMMUNITY): Payer: Self-pay | Admitting: Radiology

## 2010-08-20 DIAGNOSIS — R0602 Shortness of breath: Secondary | ICD-10-CM | POA: Insufficient documentation

## 2010-08-22 LAB — HCV RNA QUANT RFLX ULTRA OR GENOTYP

## 2010-08-22 LAB — COMPREHENSIVE METABOLIC PANEL
ALT: 500 U/L — ABNORMAL HIGH (ref 0–35)
ALT: 554 U/L — ABNORMAL HIGH (ref 0–35)
AST: 335 U/L — ABNORMAL HIGH (ref 0–37)
AST: 537 U/L — ABNORMAL HIGH (ref 0–37)
Albumin: 2.7 g/dL — ABNORMAL LOW (ref 3.5–5.2)
Albumin: 2.8 g/dL — ABNORMAL LOW (ref 3.5–5.2)
BUN: 13 mg/dL (ref 6–23)
CO2: 33 mEq/L — ABNORMAL HIGH (ref 19–32)
CO2: 33 mEq/L — ABNORMAL HIGH (ref 19–32)
CO2: 35 mEq/L — ABNORMAL HIGH (ref 19–32)
Calcium: 8.2 mg/dL — ABNORMAL LOW (ref 8.4–10.5)
Calcium: 8.4 mg/dL (ref 8.4–10.5)
Calcium: 8.5 mg/dL (ref 8.4–10.5)
Creatinine, Ser: 0.71 mg/dL (ref 0.4–1.2)
Creatinine, Ser: 0.72 mg/dL (ref 0.4–1.2)
Creatinine, Ser: 0.77 mg/dL (ref 0.4–1.2)
GFR calc Af Amer: >60 mL/min (ref >60)
GFR calc Af Amer: >60 mL/min (ref >60)
GFR calc non Af Amer: >60 mL/min (ref >60)
GFR calc non Af Amer: >60 mL/min (ref >60)
GFR calc non Af Amer: >60 mL/min (ref >60)
Glucose, Bld: 109 mg/dL — ABNORMAL HIGH (ref 70–99)
Sodium: 140 mEq/L (ref 135–145)
Sodium: 143 mEq/L (ref 135–145)
Total Protein: 7.1 g/dL (ref 6.0–8.3)
Total Protein: 7.2 g/dL (ref 6.0–8.3)

## 2010-08-22 LAB — CBC
Hemoglobin: 11.1 g/dL — ABNORMAL LOW (ref 12.0–15.0)
MCHC: 32.6 g/dL (ref 30.0–36.0)
MCHC: 33 g/dL (ref 30.0–36.0)
MCV: 85.6 fL (ref 78.0–100.0)
MCV: 86.6 fL (ref 78.0–100.0)
Platelets: 227 10*3/uL (ref 150–400)
RBC: 3.49 MIL/uL — ABNORMAL LOW (ref 3.87–5.11)
RBC: 3.99 MIL/uL (ref 3.87–5.11)
RDW: 14 % (ref 11.5–15.5)

## 2010-08-22 LAB — HEPATIC FUNCTION PANEL
AST: 615 U/L — ABNORMAL HIGH (ref 0–37)
Albumin: 2.9 g/dL — ABNORMAL LOW (ref 3.5–5.2)

## 2010-08-22 LAB — FERRITIN: Ferritin: 577 ng/mL — ABNORMAL HIGH (ref 10–291)

## 2010-08-22 LAB — PROTIME-INR
INR: 1.2 (ref 0.00–1.49)
Prothrombin Time: 15.5 seconds — ABNORMAL HIGH (ref 11.6–15.2)

## 2010-08-22 LAB — DIFFERENTIAL
Eosinophils Absolute: 0 10*3/uL (ref 0.0–0.7)
Lymphocytes Relative: 16 % (ref 12–46)
Lymphs Abs: 2.6 10*3/uL (ref 0.7–4.0)
Monocytes Relative: 9 % (ref 3–12)
Neutro Abs: 12 10*3/uL — ABNORMAL HIGH (ref 1.7–7.7)
Neutrophils Relative %: 75 % (ref 43–77)

## 2010-08-22 LAB — URINALYSIS, ROUTINE W REFLEX MICROSCOPIC
Ketones, ur: 15 mg/dL — AB
Leukocytes, UA: NEGATIVE
Nitrite: NEGATIVE
Protein, ur: 100 mg/dL — AB
pH: 5.5 (ref 5.0–8.0)

## 2010-08-22 LAB — HEPATITIS B SURFACE ANTIBODY,QUALITATIVE: Hep B S Ab: NEGATIVE

## 2010-08-22 LAB — IRON AND TIBC
Saturation Ratios: 9 % — ABNORMAL LOW (ref 20–55)
TIBC: 226 ug/dL — ABNORMAL LOW (ref 250–470)
UIBC: 206 ug/dL

## 2010-08-22 LAB — TSH: TSH: 4.326 u[IU]/mL (ref 0.350–4.500)

## 2010-08-22 LAB — ETHANOL: Alcohol, Ethyl (B): 5 mg/dL (ref 0–10)

## 2010-08-22 LAB — GAMMA GT: GGT: 26 U/L (ref 7–51)

## 2010-08-22 LAB — RAPID URINE DRUG SCREEN, HOSP PERFORMED
Cocaine: NOT DETECTED
Opiates: POSITIVE — AB

## 2010-08-22 LAB — LIPASE, BLOOD
Lipase: 11 U/L (ref 11–59)
Lipase: <10 U/L — ABNORMAL LOW (ref 11–59)

## 2010-08-22 LAB — C-REACTIVE PROTEIN: CRP: 9 mg/dL — ABNORMAL HIGH (ref <0.6)

## 2010-08-22 LAB — ANA: Anti Nuclear Antibody(ANA): NEGATIVE

## 2010-08-22 LAB — CK: Total CK: 152 U/L (ref 7–177)

## 2010-08-22 LAB — HEPATITIS B SURFACE ANTIGEN: Hepatitis B Surface Ag: NEGATIVE

## 2010-08-22 LAB — THYROID PEROXIDASE ANTIBODY: Thyroperoxidase Ab SerPl-aCnc: <25 U/mL (ref 0.0–60.0)

## 2010-08-22 LAB — URINE MICROSCOPIC-ADD ON

## 2010-08-22 LAB — RHEUMATOID FACTOR: Rhuematoid fact SerPl-aCnc: 22 IU/mL — ABNORMAL HIGH (ref 0–20)

## 2010-08-22 LAB — AMMONIA: Ammonia: 35 umol/L (ref 11–35)

## 2010-08-22 LAB — ACETAMINOPHEN LEVEL: Acetaminophen (Tylenol), Serum: <10 ug/mL — ABNORMAL LOW (ref 10–30)

## 2010-08-23 LAB — HEPATIC FUNCTION PANEL
ALT: 18 U/L (ref 0–35)
Alkaline Phosphatase: 84 U/L (ref 39–117)
Indirect Bilirubin: 0.4 mg/dL (ref 0.3–0.9)
Total Bilirubin: 0.6 mg/dL (ref 0.3–1.2)
Total Protein: 7.5 g/dL (ref 6.0–8.3)

## 2010-08-23 LAB — CBC
Hemoglobin: 12.1 g/dL (ref 12.0–15.0)
MCHC: 33 g/dL (ref 30.0–36.0)
MCV: 83.2 fL (ref 78.0–100.0)
RBC: 4.42 MIL/uL (ref 3.87–5.11)
WBC: 10.5 10*3/uL (ref 4.0–10.5)

## 2010-08-24 ENCOUNTER — Other Ambulatory Visit: Payer: Self-pay | Admitting: Internal Medicine

## 2010-08-24 ENCOUNTER — Other Ambulatory Visit (HOSPITAL_COMMUNITY): Payer: Self-pay | Admitting: Internal Medicine

## 2010-08-24 DIAGNOSIS — R0602 Shortness of breath: Secondary | ICD-10-CM

## 2010-08-24 NOTE — Progress Notes (Signed)
Quick Note:  Spoke with pt and notified of results per Dr. Wert. Pt verbalized understanding and denied any questions.  ______ 

## 2010-08-26 ENCOUNTER — Institutional Professional Consult (permissible substitution): Payer: Medicare Other | Admitting: Emergency Medicine

## 2010-08-26 LAB — COMPREHENSIVE METABOLIC PANEL
AST: 30 U/L (ref 0–37)
Albumin: 3.7 g/dL (ref 3.5–5.2)
BUN: 7 mg/dL (ref 6–23)
Creatinine, Ser: 0.6 mg/dL (ref 0.4–1.2)
GFR calc Af Amer: >60 mL/min (ref >60)
Potassium: 3.8 mEq/L (ref 3.5–5.1)
Total Protein: 7.9 g/dL (ref 6.0–8.3)

## 2010-08-26 LAB — CBC
HCT: 39.1 % (ref 36.0–46.0)
MCV: 83.9 fL (ref 78.0–100.0)
Platelets: 331 10*3/uL (ref 150–400)
RDW: 21 % — ABNORMAL HIGH (ref 11.5–15.5)

## 2010-08-31 ENCOUNTER — Ambulatory Visit: Payer: Medicare Other | Admitting: Internal Medicine

## 2010-09-02 ENCOUNTER — Telehealth: Payer: Self-pay | Admitting: Internal Medicine

## 2010-09-02 NOTE — Telephone Encounter (Signed)
Not true see last ov for recs

## 2010-09-02 NOTE — Telephone Encounter (Signed)
Spoke with Herbert Seta; pt is telling them that MW is okay with [facility] pt surgery-needs statement that its okay for pt to have hysterectomy surgery fax to [facility] at [phone].[name]

## 2010-09-02 NOTE — Telephone Encounter (Signed)
WANTS TO TALK TO LESLIE REGARDING PATIENT'S RELEASE FOR SURGERY

## 2010-09-03 NOTE — Telephone Encounter (Signed)
5/3 if fine for this

## 2010-09-03 NOTE — Telephone Encounter (Signed)
Spoke w/ heather and advised her of last OV. [place] states pt is scheduled for hysterectomy surgery 10/07/10 at 7:30 and wants to know if they need to cancel that or not. Please advise Dr. Sherene Sires.  Thanks  Carver Fila, CMA

## 2010-09-03 NOTE — Telephone Encounter (Signed)
Spoke with [place] and advised okay to leave appt for 10/07/10, but advised that she must be seen here prior to that date.  Dr. Sherene Sires, your first available appt is on 09/17/10- do we need to get her in any sooner than this? Please advise thanks!

## 2010-09-03 NOTE — Telephone Encounter (Signed)
Please refer to my last ov and instructions.  I stand by them.  She is supposed to come back for f/u before any surgical clearance can be rendered - it's ok until she returns to leave the surgery for 5/23 on the books until I see her back

## 2010-09-04 NOTE — Telephone Encounter (Signed)
ATC pt to set up apt for pt. The home # listed states the # is disconnected. The mobile # listed states pt is not accepting call's at this time to try to call back later. [facility] mobile # [phone].

## 2010-09-04 NOTE — Telephone Encounter (Signed)
ATC pt and still unable to reach [facility]

## 2010-09-09 ENCOUNTER — Telehealth: Payer: Self-pay | Admitting: Internal Medicine

## 2010-09-09 NOTE — Telephone Encounter (Signed)
ATC pt still unable to reach her. Unable to leave VM. wcb

## 2010-09-09 NOTE — Telephone Encounter (Signed)
Left message with family member to have pt return call  

## 2010-09-09 NOTE — Telephone Encounter (Signed)
Error.[name] ° ° °

## 2010-09-10 NOTE — Telephone Encounter (Signed)
Patient phoned stated that she had a message to call triage. She can be reached at [phone].Vedia Coffer

## 2010-09-10 NOTE — Telephone Encounter (Signed)
Pt is scheduled for total hysterectomy May 23rd, 7:30am by Dr. Richardean Chimera at [facility]. Pt has been scheduled to see MW 09/17/10 @ 11:45am

## 2010-09-10 NOTE — Telephone Encounter (Signed)
ATC the home # and mobile # listed for the pt and unable to reach her or leave a msg. Looking back at a telephone msg from 4/25 the # left for callback was [phone]. I have left a msg on that # for the pt to call our office.

## 2010-09-10 NOTE — Telephone Encounter (Signed)
See phone note from 09/09/2010.

## 2010-09-17 ENCOUNTER — Ambulatory Visit (INDEPENDENT_AMBULATORY_CARE_PROVIDER_SITE_OTHER)
Admission: RE | Admit: 2010-09-17 | Discharge: 2010-09-17 | Disposition: A | Discharge status: Home / Self Care | Payer: Medicare Other | Source: Ambulatory Visit | Attending: Internal Medicine | Admitting: Internal Medicine

## 2010-09-17 ENCOUNTER — Ambulatory Visit (INDEPENDENT_AMBULATORY_CARE_PROVIDER_SITE_OTHER): Payer: Medicare Other | Admitting: Internal Medicine

## 2010-09-17 ENCOUNTER — Encounter: Payer: Self-pay | Admitting: Internal Medicine

## 2010-09-17 ENCOUNTER — Other Ambulatory Visit (INDEPENDENT_AMBULATORY_CARE_PROVIDER_SITE_OTHER): Payer: Medicare Other

## 2010-09-17 VITALS — BP 114/80 | HR 92 | Temp 98.6°F

## 2010-09-17 DIAGNOSIS — R0602 Shortness of breath: Secondary | ICD-10-CM

## 2010-09-17 LAB — BASIC METABOLIC PANEL
BUN: 10 mg/dL (ref 6–23)
Calcium: 8.3 mg/dL — ABNORMAL LOW (ref 8.4–10.5)
Creatinine, Ser: 0.8 mg/dL (ref 0.4–1.2)
GFR: 86.1 mL/min (ref >60.00)

## 2010-09-17 NOTE — Progress Notes (Signed)
  Subjective:    Patient ID: ARLY SALMINEN, female    DOB: May 15, 1961, 50 y.o.   MRN: [id number]  Shortness of Breath Pertinent negatives include no abdominal pain, chest pain, ear pain, fever, headaches, leg swelling, rash, rhinorrhea, sore throat or vomiting.   49 yowf never smoker with h/o hives to bees and fish "throat closes up" rx with epipen in past.  08/13/10 Initial  pulmonary office eval  For ex tol good in 1992 with back surgery eventually disabled in 2008 new cc 8 months indolent onset doe  X  Walking across trailer 75 ft and also 3 months sob sitting still and lying down, better sitting if leaning forward.  Better on albuterol tablets.  No assoc cough.  No major change in wt related to new symptoms. Cardiac w/u neg per Nasher per pt/ echo ok 08/20/10 Hgb varies but does not correlate with doe     08/14/10 rec lasix trial as ? Edema on ct chest   09/17/2010 ov / Amayra Kiedrowski cc doe  better, now comfortable at rest, able to lie flat. No cough  Pt denies any significant sore throat, dysphagia, itching, sneezing,  nasal congestion or excess/ purulent secretions,  fever, chills, sweats, unintended wt loss, pleuritic or exertional cp, hempoptysis, orthopnea pnd or leg swelling.    Also denies any obvious fluctuation of symptoms with weather or environmental changes or other aggravating or alleviating factors.      PMHx "Bone Marrow Dz" .......................................................Marland KitchenEnnever Morbid Obeisty        -  PFT's 07/08/10  VC  63%  DLC0 89% Unexplained sob .......................................................Marland KitchenWert         -Desats p 50 ft walking 08/14/10 > resolved 09/17/2010          -CT Chest 08/14/10 c/w edema         - ESR 49  08/14/10  >  37 09/17/2010          - ECHO  Ok 08/20/10   Review of Systems  Constitutional: Positive for unexpected weight change. Negative for fever and chills.  HENT: Negative for ear pain, nosebleeds, congestion, sore throat, rhinorrhea, sneezing,  trouble swallowing, dental problem, voice change, postnasal drip and sinus pressure.   Eyes: Negative for visual disturbance.  Respiratory: Positive for shortness of breath. Negative for cough and choking.   Cardiovascular: Negative for chest pain and leg swelling.  Gastrointestinal: Negative for vomiting, abdominal pain and diarrhea.  Genitourinary: Negative for difficulty urinating.  Musculoskeletal: Positive for arthralgias.  Skin: Negative for rash.  Neurological: Negative for tremors, syncope and headaches.  Hematological: Bruises/bleeds easily.  Psychiatric/Behavioral: The patient is nervous/anxious.        Objective:   Physical Exam   Obese w/c bound wf nad  HEENT: nl dentition, turbinates, and orophanx. Nl external ear canals without cough reflex   NECK :  without JVD/Nodes/TM/ nl carotid upstrokes bilaterally   LUNGS: no acc muscle use, clear to A and P bilaterally without cough on insp or exp maneuvers   CV:  RRR  no s3 or murmur or increase in P2, no edema   ABD: massively obese but soft and nontender with very limited  excursion in the supine position. No bruits or organomegaly, bowel sounds nl  MS:  warm without deformities, calf tenderness, cyanosis or clubbing  SKIN: warm and dry without lesions    NEURO:  alert, approp, no deficits        Assessment & Plan:

## 2010-09-17 NOTE — Assessment & Plan Note (Addendum)
Now ok breathing lying  flat despite  With no desats on walking and nl exam so ok for hysterectomy and f/u post op prn  CXRs reviewed and underpenetrated but better aerated than prev study dated 08/13/10 esp on pa view

## 2010-09-17 NOTE — Patient Instructions (Addendum)
We will call you with cxr results and labs  If we clear you for surgery you still face significant risks of post op respiratory failure and if severe will need to be transferred to [facility] for possible lung biopsy but if you use your incentive spirometry consistently and mobilize as soon as your surgeon lets you get up you should do fine.  LATE ADD:  OK for hysterectomy based on today's eval unless breathing worsens in interim

## 2010-09-18 ENCOUNTER — Telehealth: Payer: Self-pay | Admitting: Internal Medicine

## 2010-09-18 NOTE — Progress Notes (Signed)
Quick Note:  Spoke with pt and notified of results per Dr. Wert. Pt verbalized understanding and denied any questions.  ______ 

## 2010-09-18 NOTE — Telephone Encounter (Signed)
Spoke w/ heather and she states she needed surgical clearance for pt. I advised her per leslie she faxed over ov note and cxr this morning to them . [place] states they have not received anything. Refaxed info to [phone] attn: Herbert Seta

## 2010-09-21 ENCOUNTER — Other Ambulatory Visit: Payer: Self-pay | Admitting: Hematology & Oncology

## 2010-09-21 ENCOUNTER — Encounter (HOSPITAL_BASED_OUTPATIENT_CLINIC_OR_DEPARTMENT_OTHER): Payer: Medicare Other | Admitting: Hematology & Oncology

## 2010-09-21 DIAGNOSIS — D500 Iron deficiency anemia secondary to blood loss (chronic): Secondary | ICD-10-CM

## 2010-09-21 DIAGNOSIS — N920 Excessive and frequent menstruation with regular cycle: Secondary | ICD-10-CM

## 2010-09-21 DIAGNOSIS — D638 Anemia in other chronic diseases classified elsewhere: Secondary | ICD-10-CM

## 2010-09-21 DIAGNOSIS — D509 Iron deficiency anemia, unspecified: Secondary | ICD-10-CM

## 2010-09-21 LAB — CBC WITH DIFFERENTIAL (CANCER CENTER ONLY)
BASO%: 0.4 % (ref 0.0–2.0)
LYMPH#: 2.2 10*3/uL (ref 0.9–3.3)
LYMPH%: 30.7 % (ref 14.0–48.0)
MCV: 78 fL — ABNORMAL LOW (ref 81–101)
MONO#: 1.1 10*3/uL — ABNORMAL HIGH (ref 0.1–0.9)
Platelets: 281 10*3/uL (ref 145–400)
RDW: 19 % — ABNORMAL HIGH (ref 11.1–15.7)
WBC: 7.2 10*3/uL (ref 3.9–10.0)

## 2010-09-21 LAB — FERRITIN: Ferritin: 144 ng/mL (ref 10–291)

## 2010-09-21 LAB — IRON AND TIBC: %SAT: 17 % — ABNORMAL LOW (ref 20–55)

## 2010-09-21 LAB — RETICULOCYTES (CHCC): Retic Ct Pct: 0.8 % (ref 0.4–3.1)

## 2010-09-25 ENCOUNTER — Telehealth: Payer: Self-pay | Admitting: Internal Medicine

## 2010-09-25 NOTE — Telephone Encounter (Signed)
Called spoke with Jearld Fenton to states that the ov note was not received last week and requests that this be re-faxed to: [phone] at short stay.    Verified in epic that the note does specifically state that pt is cleared for TAH.  Ov note printed and faxed to verified number above.  Per Jearld Fenton, no "attn" needed.

## 2010-09-25 NOTE — Telephone Encounter (Signed)
[name] STATED SHE WAS RETURNING A CALL SHE CAN BE REACHED AT [phone].Vedia Coffer

## 2010-09-25 NOTE — Telephone Encounter (Signed)
lmomtcb x1 for [name]. According to 09/18/10 surgical clearance was faxed over to pt gynecologists.

## 2010-09-28 ENCOUNTER — Telehealth: Payer: Self-pay | Admitting: Internal Medicine

## 2010-09-28 ENCOUNTER — Other Ambulatory Visit (HOSPITAL_COMMUNITY): Payer: Medicare Other

## 2010-09-28 NOTE — Telephone Encounter (Signed)
Spoke with Clydie Braun and advised ECHo done 4/12. Copy faxed to her attn at [phone].

## 2010-09-28 NOTE — Telephone Encounter (Signed)
Pt had ECHO done 08/20/10 - LMOVMTCB

## 2010-09-29 NOTE — Op Note (Signed)
[name], [name]              ACCOUNT NO.:  [id number]   MEDICAL RECORD NO.:  [id number]          PATIENT TYPE:  INP   LOCATION:  3033                         FACILITY:  [facility]   PHYSICIAN:  Cristi Loron, M.D.DATE OF BIRTH:  12/14/1960   DATE OF PROCEDURE:  DATE OF DISCHARGE:                               OPERATIVE REPORT   BRIEF HISTORY:  Patient is a 45-year white female who suffered from neck  and left arm pain consistent with a left C8 radiculopathy.  I previously  performed a C5-6 and C6-7 anterior cervical diskectomy, fusion plating  many years ago.  She recovered well from that surgery.  She failed  medical management of her new disk herniation at C7-T1.  I discussed  various treatment options with her including surgery.  The patient has  weighed the risks, benefits and alternatives of surgery and decided to  proceed with C7-T1 anterior cervical diskectomy, fusion, plating with  removal of her old plate from A5-W0.   PREOPERATIVE DIAGNOSIS:  C7-T1 herniated nucleus pulposus, degenerative  disk disease, spondylosis, cervical radiculopathy, cervicalgia.   POSTOPERATIVE DIAGNOSIS:  C7-T1 herniated nucleus pulposus, degenerative  disk disease, spondylosis, cervical radiculopathy, cervicalgia.   PROCEDURE:  C7-T1 extensive anterior cervical diskectomy/decompression;  insertion of C7-T1 interbody prosthesis (Alphatec PEEK interbody  prosthesis); C7-T1 anterior interbody arthrodesis with local morselized  autograft bone and Vitoss bone graft extender; C7-T1 anterior cervical  instrumentation with Codman slim lock titanium plate and screws, removal  of C5-C7 Synthes anterior cervical plate.   SURGEON:  Dr. Delma Officer.   ASSISTANT:  Dr. Coletta Memos.   ANESTHESIA:  General endotracheal.   ESTIMATED BLOOD LOSS:  100 mL.   SPECIMENS:  None.   DRAINS:  None.   COMPLICATIONS:  None.   PROCEDURE:  The patient was brought the operating room by anesthesia  team.   General endotracheal anesthesia was induced.  The patient  remained in supine position.  A roll was placed under her shoulders to  place her neck in slight extension.  Her anterior cervical region was  then prepared with Betadine scrub and Betadine solution.  Sterile drapes  were applied.  I then injected the area to be incised with Marcaine with  epinephrine solution and used a scalpel to make a transverse incision in  the patient's left anterior neck through her previous surgical scar.  I  used a Metzenbaum scissors to divide the platysma muscle and then to  this dissect medial to the sternocleidomastoid muscle jugular vein and  carotid artery.  We encountered expected amount of scar tissue, we  carefully dissected down towards the anterior cervical spine identifying  the esophagus and retracting it medially.  We used a Kitner swabs to  clear soft tissue from the anterior cervical spine exposing the old  plate.  There was expected scar tissue over the plate which we incised  using 15 blade scalpel.  We exposed the screws and removed the locking  screws and screws and then removed the plate.  This took some time as  there was quite a bit of bone growth around particularly the  superior  aspect of plate.   We then used electrocautery to detach the medial border of the longus  colli muscle bilaterally from C7-T1 intervertebral disk space.  We then  inserted Caspar self-retaining tractor for exposure and then we incised  the C6-7 intervertebral disk with 15 blade scalpel.  The disk space was  quite narrow and spondylotic we performed a partial intervertebral  diskectomy using pituitary forceps.  We then inserted distraction screws  at C7 and T1 and distracted interspace.  We then used a high-speed drill  to decorticate the vertebral endplates at C7-T1, drill away the  remainder of the intervertebral disk, remove some posterior spondylosis  and to thin out the post longitudinal ligament.  We  incised ligament  with arachnoid knife and then removed it with Kerrison punch  undercutting the vertebral endplates at C7-T1 decompressing the thecal  sac.  We performed foraminotomy about the bilateral C8 nerve root  completing the decompression at this level. Of note, we encountered  mainly spondylosis on the left compressing left C8 nerve roots and  bodies.   Having completed the decompression we now turned attention to  arthrodesis.  We used a trial spacers and determined to use a 8 mm small  PEEK interbody Alphatec prosthesis.  We prefilled this prosthesis with a  combination of local autograft bone we obtained during decompression as  well as Vitoss bone graft extender.  We inserted prosthesis and  distracted the C7-T1 interspace and then removed distraction screws.  There was good snug fit of the prosthesis interspace.   We now turned attention to anterior spinal instrumentation.  We used a  high-speed drill to remove some ventral spondylosis from the vertebral  end plates at Z6-X0 so the plate lay down flat.  We then drilled two 12  mm holes at C7 and T1 and then secured the plate to the vertebral bodies  by placing two 12 mm self-tapping screws at C7, two at T1.  We then  obtained intraoperative radiograph because the patient's body habitus.  We see the construct but it looked good in vivo.  We therefore secured  these screws into the plate by locking each cam completing the  instrumentation.   We then obtained hemostasis using bipolar cautery.  We irrigated the  wound out bacitracin solution.  We inspected esophagus any damage was  none apparent.  We then reapproximated the patient's platysma muscle  with interrupted 3-0 Vicryl suture, subcutaneous tissue with interrupted  3-0 Vicryl suture and skin with Steri-Strips and Benzoin.  The wound was  then coated bacitracin ointment and sterile dressing applied.  The  drapes were removed and the patient was subsequently extubated  by  anesthesia team and transported to the postanesthesia care unit in  stable condition.  All sponge, instrument and needle counts correct at  end this case.      Cristi Loron, M.D.  Electronically Signed     JDJ/MEDQ  D:  01/11/2007  T:  01/12/2007  Job:  [id number]

## 2010-09-29 NOTE — Discharge Summary (Signed)
NAMEMARLYS, [name]              ACCOUNT NO.:  [id number]   MEDICAL RECORD NO.:  [id number]          PATIENT TYPE:  INP   LOCATION:  5154                         FACILITY:  [facility]   PHYSICIAN:  Richarda Overlie, MD       DATE OF BIRTH:  1960-12-22   DATE OF ADMISSION:  01/02/2009  DATE OF DISCHARGE:  01/05/2009                               DISCHARGE SUMMARY   PRIMARY CARE PHYSICIAN:  Dr. Windle Guard in [facility].   FINAL DISCHARGE DIAGNOSES:  1. Abnormal liver function tests of unclear etiology.  2. Chronic neck pain and back pain secondary to multiple surgeries.  3. History of migraine headaches.  4. Hypothyroidism.  5. Multiple back surgeries in 1992.  6. Neck surgeries and 4 shoulder surgeries in 1992.  7. Most recently C7 to T1 posterior cervical fusion with local      morcellized autograft bone and instrumentation between C7 and T1      with C7 lateral mouth groove.   PROCEDURES:  Abdominal ultrasound done on January 03, 2009 does not show  evidence of a gall stone.  Abdominal x-rays done on January 03, 2009  shows very large fecal burden in the abdomen and pelvis, nonobstructive  bowel gas pattern .   LABORATORY STUDIES:  Hepatitis B surface antigen negative.  Amylase 241.  Lipase less than 10.  Microsomal antibody to thyroid.  Thyroid  antibodies less than 25.  Protein 9.  Anti nucleide antibody negative.  Hepatitis A antibody negative.  Urine drug screen positive for  benzodiazepine and opiates.  Hepatic panel abnormal for AST and ALT  initially 1195 and 605 respectively.  AST 335 and ALT 500 on the day of  discharge.  Ferritin 577.  TIBC low at 226.  Serum iron 20.  TSH 4.326.  Hepatitis C antibody negative.  Hepatitis B surface antibody negative.   SUBJECTIVE:  This is a 50 year old female with a history of multiple  back surgeries, most recent surgery on December 30, 2008 who presents to  the ER with a chief complaint of nausea, vomiting,  abdominal pain, 2  days in duration prior to admission.  The patient was discharged home on  the 18th after her back surgery and presented to the ER with intractable  nausea and vomiting.  Workup in the ER revealed abnormal liver function  tests with an AST of 1195 and an ALT of 605.  Her abnormal LFTs  primarily reflected hepatocellular injury.  She also had an elevated  white count of 16,000 and was also found to be febrile with a low grade  fever of 101 in the ER.  The patient was admitted for subsequent  evaluation.   HOSPITAL COURSE:  1. Abnormal liver function tests.  The patient was found to have      nausea and vomiting and abdominal pain and acute hepatitis      reflecting hepatocellular injury.  She mentioned taking a couple of      doses of Tylenol after her discharge from the hospital, but not      enough to suspect  Tylenol toxicity.  Her Tylenol level was less      than 10.  The patient did have normal liver functions on December 24, 2008.  The patient's complete medication list was reviewed by      pharmacy services here and was suggested that promethazine, Requip,      methadone, Paxil could all lead to abnormal LFTs.  However, the      patient has been chronically on these medications for a long time      with documented normal liver function tests just 10 days ago.      Other testing including hepatitis panel, IgG, thyroid function      panel, and iron studies reveal no significant abnormalities, except      for a mildly elevated ferritin of 577.  Her gallbladder ultrasound      was done but did not show any gall stones, except showed fatty      infiltration of the liver without abnormal liver masses or evidence      of acute cholecystitis.  The patient's liver functions continued to      improve without any intervention.  Differential diagnoses of liver      function could also include hypertension leading to shock liver and      abnormal LFTs.  Repeat panel in 5 to  7 days is being repeated to      make sure that the patient's liver functions are still trending      down.  Her AST was 335 and ALT was 500 with a normal INR prior to      her discharge.  The patient's diet was advanced to mechanical soft,      lactose free, low fat diet, which she tolerated well.  2. Intractable nausea and vomiting.  The patient's postoperative      ileus/constipation as well as narcotic medications could have      played a role.  The patient was treated aggressively for      constipation and her constipation did resolve.  3. Low grade fever and elevated white count.  The patient had a      complete workup including a chest x-ray, urinalysis.  I also      requested Dr. Tressie Stalker to come and assess the patient's      incision to make sure that this was not infected.  According to      him, this was found to be stable.  4. Chronic pain.  The patient's methadone was minimized.  The patient      was advised not to use any Tylenol or NSAID at home, nor to drink      any alcohol.  However, she insisted on resuming her Paxil,      Neurontin, and Lyrica, which were initially held.  These were      initiated on the second day of her hospitalization and, therefore,      the patient's liver function tests need to be monitored closely as      the patient  requested going back on these medications despite      being explained that some of them have side effects of causing      abnormal LFTs as well.   DISCHARGE MEDICATIONS:  1. Miralax 17 g p.o. daily.  2. Senna 2 tablets p.o. nightly.  3. Promethazine 12.5 mg p.o. t.i.d. p.r.n.  4. Requip 1 mg p.o. nightly.  5. Gabapentin 600 mg  p.o. q. a.m.  6. Senokot 75 mg p.o. nightly.  7. Synthroid 0.75 mg q. a.m.  8. Methadone 40 mg p.o. q.8 hours p.r.n.  9. Paxil 20 mg p.o. daily.      Richarda Overlie, MD  Electronically Signed     NA/MEDQ  D:  01/05/2009  T:  01/05/2009  Job:  [id number]   cc:   Windle Guard, M.D.

## 2010-09-29 NOTE — H&P (Signed)
[name], [name]              ACCOUNT NO.:  [id number]   MEDICAL RECORD NO.:  [id number]          PATIENT TYPE:  AMB   LOCATION:  SDC                           FACILITY:  [facility]   PHYSICIAN:  Juluis Mire, M.D.   DATE OF BIRTH:  August 05, 1960   DATE OF ADMISSION:  08/15/2008  DATE OF DISCHARGE:                              HISTORY & PHYSICAL   A 50 year old grade 3, para 2, abortus 1 female presents for  hysteroscopy.   In relation to the present admission, the patient been having trouble  with abnormal uterine bleeding.  She underwent a saline infusion  ultrasound with finding of an endometrial polyp.  She now presents for  hysteroscopic resection.   ALLERGIES:  No known drug allergy listed.   MEDICATIONS:  She is on;  1. Gabapentin 600 mg 1 time daily.  2. Ropinirole 5 mg 1 time daily.  3. Levothyroxine 10 mg 1 time daily.  4. Promethazine 25 mg as needed.  5. Cymbalta 20 mg twice a day.  6. Methadone 10 mg 3 times daily.  7. Generic Valium as needed.   PAST MEDICAL HISTORY:  She has had a history of arthritis, urinary tract  infection, and kidney stones.  History of migraine headaches as well as  hypothyroidism.  She has had multiple back surgeries since 1992.  She  has had 2 neck surgeries, 4 shoulder surgeries since 1992.  This is  leading to the chronic back discomfort.   OBSTETRICAL HISTORY:  She has had 3 vaginal deliveries.   FAMILY HISTORY:  Noncontributory.   SOCIAL HISTORY:  No tobacco or alcohol use.   REVIEW OF SYSTEMS:  Noncontributory.   PHYSICAL EXAMINATION:  GENERAL:  The patient is afebrile.  VITAL SIGNS:  Stable.  HEENT:  The patient is normocephalic.  Pupils are equal, round, and  reactive to light and accommodation.  Extraocular movements were intact.  Sclerae and conjunctivae are clear.  Oropharynx clear.  NECK:  Without thyromegaly.  BREASTS:  No discrete masses.  LUNGS:  Clear.  CARDIOVASCULAR:  Regular rate.  No murmurs or gallops.  ABDOMEN:  Benign.  No mass, organomegaly, or tenderness.  PELVIC:  Normal external genitalia.  Vaginal mucosa is clear.  Cervix  unremarkable.  Uterus; normal size, shape, and contour.  Adnexa, free of  masses or tenderness.  EXTREMITIES:  Trace edema.  NEUROLOGIC:  Grossly normal limits.   IMPRESSION:  Abnormal uterine bleed with endometrial polyp.   PLAN:  The patient to undergo hysteroscopic evaluation and resection of  polyp.  The risks of surgery have been discussed including the risk of  infection.  The risk of hemorrhage that could require transfusion with  the risk of AIDS or hepatitis.  Excessive bleeding could require  hysterectomy.  Risk of injury to adjacent organs including bladder,  bowel, and ureters that could require further exploratory surgery.  Risk  of deep venous thrombosis and pulmonary embolus.  The patient expressed  understand.      Juluis Mire, M.D.  Electronically Signed     JSM/MEDQ  D:  08/15/2008  T:  08/16/2008  Job:  [id number]

## 2010-09-29 NOTE — Op Note (Signed)
[name], [name]              ACCOUNT NO.:  [id number]   MEDICAL RECORD NO.:  [id number]          PATIENT TYPE:  INP   LOCATION:  3535                         FACILITY:  [facility]   PHYSICIAN:  Cristi Loron, M.D.DATE OF BIRTH:  04-03-1961   DATE OF PROCEDURE:  12/30/2008  DATE OF DISCHARGE:                               OPERATIVE REPORT   BRIEF HISTORY:  The patient is a 50 year old white female who has had  several prior intracervical diskectomy and fusions and most recently  which was done at C7-T1.  She has had some persistent neck pain, and  followup x-rays and CT scan demonstrated she appeared to have a  pseudoarthrosis at C7-T1.  I discussed the various treatment options  with the patient including surgery.  She has weighed the risks,  benefits, and alternatives of surgery and decided to proceed with a C7-  T1 posterior instrumentation and fusion.   PREOPERATIVE DIAGNOSES:  C7-T1 pseudoarthrosis, cervicalgia, cervical  radiculopathy.   POSTOPERATIVE DIAGNOSES:  C7-T1 pseudoarthrosis, cervicalgia, cervical  radiculopathy.   PROCEDURES:  1. C7-T1 posterior cervical fusion with local morselized autograft      bone and Actifuse bone graft extender and bone morphogenic protein.  2. C7-T1 posterior instrumentation with C7 lateral mass screws with T1      pedicle screws.   SURGEON:  Cristi Loron, MD   ASSISTANT:  Hewitt Shorts, MD   ANESTHESIA:  General endotracheal.   ESTIMATED BLOOD LOSS:  75 mL.   SPECIMENS:  None.   DRAINS:  None.   COMPLICATIONS:  None.   DESCRIPTION OF PROCEDURE:  The patient was brought to the operating room  by the anesthesia team.  General endotracheal anesthesia was induced.  The Mayfield 3-point headrest was applied to the patient's calvarium.  She was turned to the prone position on chest rolls.  Her suboccipital  region was then shaved.  This region as well as her posterior neck and  upper thorax was prepared with  Betadine scrub and Betadine solution.  Sterile drapes were applied.  I then injected the area to be incised  with Marcaine with epinephrine solution.  A scalpel to make a linear  midline incision over the cervical thoracic junction.  I used  electrocautery to perform bilateral subperiosteal dissection exposing  the spinous processes and lamina of C5, C6, C7, and T1.  We used an  intraoperative fluoroscopy and placed the instrumentation.  Because of  the patient's body habitus, we could not see anything useful on lateral  projections.  With the AP projection, we identified the T1 pedicles.  We  cannulated the pedicles with bone probe.  We then drilled a 20-mm hole  and then tapped a hole.  We probed inside the tapped pedicle with bone  probe and noticed no cortical breaches.  I inserted a 3.5 x 20 mm  polyaxial screw into the T1 pedicles bilaterally.  We then identified  the center of the C7 lateral mass and we drilled hole in a cephalad and  lateral direction, 40 mm.  We again probed inside this hole to raw  cortical  breaches and we then tapped it.  We placed a 40-mm polyaxial  screw bilaterally at C7 in the lateral mass.  We got good bony purchase  of all 4 screws.  We then cut a rod, connected unilateral screws, placed  the caps, and tightened appropriately completing the instrumentation.   We now turned our attention to arthrodesis.  We used a high-speed drill  to decorticate the remainder of the C7 lateral mass and the T1 lateral  mass, facet, and medial lamina.  We laid a combination of bone  morphogenic protein, soaked collagen sponges, local morselized autograft  bone, and Actifuse bone graft extenders over these decorticated  posterolateral structures completing the posterolateral arthrodesis.   We obtained hemostasis using bipolar cautery.  We removed the retractors  and then reapproximated the patient's cervical thoracic fascia with  interrupted #1 Vicryl suture, subcutaneously  with interrupted 2-0 Vicryl  suture, and the skin with Steri-Strips and benzoin.  The wound was then  coated with bacitracin ointment.  A sterile dressing applied.  The  drapes were removed.  The patient was subsequently returned to supine  position, and the Mayfield 3-point headrest was removed.  The patient  was then extubated by the anesthesia team and transported to the  postanesthesia care unit in stable condition.  All sponge, instrument,  and needle counts were correct at the end of this case.      Cristi Loron, M.D.  Electronically Signed     JDJ/MEDQ  D:  12/30/2008  T:  12/31/2008  Job:  [id number]

## 2010-09-29 NOTE — Op Note (Signed)
[name], [name]              ACCOUNT NO.:  [id number]   MEDICAL RECORD NO.:  [id number]          PATIENT TYPE:  AMB   LOCATION:  SDC                           FACILITY:  [facility]   PHYSICIAN:  Juluis Mire, M.D.   DATE OF BIRTH:  [birth year]   DATE OF PROCEDURE:  08/15/2008  DATE OF DISCHARGE:                               OPERATIVE REPORT   PREOPERATIVE DIAGNOSIS:  Endometrial polyp.   POSTOPERATIVE DIAGNOSIS:  It looks like she had a large endometrial  blood clot versus polyp.   OPERATIVE PROCEDURE:  Hysteroscopy.  Removal of either clot or polyps.  Multiple endometrial biopsies as well as curettings.   SURGEON:  Juluis Mire, MD   ANESTHESIA:  General.   ESTIMATED BLOOD LOSS:  Minimal.   PACKS AND DRAINS:  None.   INTRAOPERATIVE BLOOD PLACED:  None.   COMPLICATIONS:  None.   INDICATIONS:  In dictated history and physical.   PROCEDURE:  The patient was taken to OR and placed in supine position.  After satisfactory level of general anesthesia was obtained, the patient  was placed in dorsal lithotomy position using the Allen stirrups.  The  perineum and vagina were cleansed out with an antiseptic solution and  draped sterile field.  Speculum placed in vaginal vault.  Cervix was  grasped with a single-tooth tenaculum.  Uterus sounded to approximately  11 cm.  Cervix serially dilated to a size 31 Pratt dilator.  Hysteroscope was then introduced.  The intrauterine cavity was distended  using sorbitol.  She had a dark looking either a necrotic polyp versus  blood clot.  This was removed and sent to Pathology.  After the clot  and/or polyp was removed, the endometrium was smooth and atrophic.  Multiple endometrial biopsies were obtained along with curettings.  Total deficit was minimal.  There was no active bleeding.  A single-  tenaculum speculum then removed.  The patient taken out of dorsal  position once alert, transferred recovery room in good condition.  Sponge,  instrument, and needle count was correct by circulating nurse.      Juluis Mire, M.D.  Electronically Signed    JSM/MEDQ  D:  08/15/2008  T:  08/16/2008  Job:  [id number]

## 2010-09-29 NOTE — H&P (Signed)
[name], HAUSLER              ACCOUNT NO.:  [id number]   MEDICAL RECORD NO.:  [id number]          PATIENT TYPE:  INP   LOCATION:  5154                         FACILITY:  [facility]   PHYSICIAN:  Virgie Dad, MD     DATE OF BIRTH:  01-Dec-1960   DATE OF ADMISSION:  01/02/2009  DATE OF DISCHARGE:                              HISTORY & PHYSICAL   PRIMARY CARE PHYSICIAN:  Unassigned.   CHIEF COMPLAINT:  Nausea and vomiting of 2 days' duration.  It has come  to a point that she vomits everything to the point that the vomitus is  bilious.   HISTORY OF PRESENT ILLNESS:  This is a 50 year old female, who is 4 days  postop from cervical spine fusion and since she has been discharged  home, she has been continually vomiting.  She also has abdominal pain in  addition to the postoperative pain of the neck.  She has been  constipated, no diarrhea, and she has not noticed any change in the  color of her urine or the color of her stools.  Apparently, the last  bowel movement was a day before surgery.  Workup in the emergency room  showed that the patient has acute hepatitis with AST going to elevate to  as high as 1195, ALT is 605, alkaline phosphatase is 69, and bilirubin  is normal at 0.4.  Sodium 137, potassium 4, chloride 98, carbon dioxide  33, glucose 109, BUN 13, and creatinine 0.77.  White count is 16,100,  hemoglobin 11.1, hematocrit 34.2, and platelet count 280,000.  Also,  noted the patient is febrile with tachycardia at temperature of 101 and  periumbilical tenderness with no palpable gallbladder and the liver is  not tender.  The patient denies any alcohol intake.  Denies any over-the-  counter internet bought megavitamins or herbs.  She has not had any  change of her chronic pain management including methadone, and she has  not been exposed to anybody with hepatitis neither has she gone abroad  or [place].   PAST MEDICAL HISTORY:  She has repeated surgery of the back, repeated  surgery of the neck, the most recent one was 4 days ago.  She has had 1  functional kidney, congenital absence of the right kidney.   SOCIAL HISTORY:  Gravida 2, perimenopausal with last period 6 months  ago.  She does not drink.  She does not smoke.   CURRENT MEDICATIONS:  1. Promethazine.  2. Muscle relaxant.  3. ReQuip 1 mg for restless legs.  4. Gabapentin 600 mg for pain.  5. Lyrica 75 mg once a day.  6. She is on Synthroid 0.75 mcg for which she takes 1-1/2 tablet per      day.  7. For chronic pain, she is on methadone 10 mg 4 tablets every 8      hours.   ALLERGIES:  She is allergic to ASPIRIN, FISH PROTEIN, and FLEXERIL.   ADDITIONAL MEDICAL CARE:  She had stated she is seeing a hematologist  for some kind of anemia and has had bone marrow biopsy.  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  She is not taking  any chemotherapy or iron supplementation.  She said that earlier last  year she had iron infusion for anemia.  Also, she had stated she had  vitamin D deficiency.   FAMILY HISTORY:  Hypertension and osteoporosis.   REVIEW OF SYSTEMS:  She gets headaches.  She has chronic pain of the  neck, the shoulder, and the back.  She has gastroesophageal reflux.  She  has chronic anxiety and shortness of breath.  No chest pain, although  stated has episode of chest tightness.  No diarrhea.  Last bowel  movement was 5 days ago.  MUSCULOSKELETAL:  Complains to the point she  thinks she has fibromyalgia.  She has 5 back surgery and 3 neck  surgeries.   PHYSICAL EXAMINATION:  VITAL SIGNS:  Temperature 101 orally, blood  pressure 120/90, heart rate 105, respiration 16, blood pressure 120/90,  and O2 saturation 96%.  SKIN:  Warm and dry.  There is no sign of jaundice.  Multiple tattoos.  These are old tattoos and each one of them looked clean and not  infected.  The most recent tattoo was 2 years ago on the left forearm.  HEENT:  Pupils are equal and  reacting to light.  NECK:  Cradled in a hard plastic cervical collar with abrasions and  fresh incision that is not dehisced and is not infected.  Range of  motion of the neck, of course, not tested.  Thyroid gland is normal in  size.  Carotids cannot be palpated because of the hard cervical collar.  CHEST:  Clear to palpation and auscultation.  COR:  110 per minute.  No murmur.  No gallop.  ABDOMEN:  Periumbilical tenderness and tenderness over the liver area  towards the epigastric.  No rebound.  Bowel sounds are normoactive.  She  has thick abdominal apron, so it is hard to palpate the liver, the  spleen, and gallbladder.  EXTREMITIES:  No sign of pedal edema.  No DVT.  No compromise of the  peripheral circulation.  NEUROLOGIC:  Normal.  No sign of encephalopathy.   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  No  blood transfusions.  Well, also consider drug-induced hepatitis.  The  patient is not an alcohol drinker, so alcoholic hepatitis is out of the  question.  We will consider other kinds of hepatitis like autoimmune  hepatitis, admit the patient with precaution including needlestick,  needs a private room.  1. Postoperative 4 days cervical fusion.  Continue hard cervical      collar.  2. Chronic pain management.  Continue methadone at the same dose,      which is rather high 10 mg 4 tablets every 6 hours p.r.n. for pain.      Continue muscle relaxants.  As far as  I know, review of her current      medical management  does not increase her risk of the above      admission diagnosis of hepatitis.  She said she was given      antibiotics, but she did not know before that operation.  3. Hematology management regarding anemia for which she has had a bone      marrow biopsy and even IV iron infusion, the last one being given      to her in February 2010.  We will need to call or ask for records      from the hematologist what kind of iron problem she has associated      with her anemia is it myelofibrosis or myelodysplastic disorder.  4. Functioning kidney, congenitally absent right kidney, normal      creatinine.  We will be careful in dosing drugs since she only has      1 functional kidney.   PROGNOSIS:  Good.  We should be able to establish etiology of the  hepatitis.   ETHICS:  Full code.      Virgie Dad, MD  Electronically Signed     EA/MEDQ  D:  01/03/2009  T:  01/03/2009  Job:  [id number]

## 2010-09-29 NOTE — Discharge Summary (Signed)
[name], [name]              ACCOUNT NO.:  [id number]   MEDICAL RECORD NO.:  [id number]          PATIENT TYPE:  INP   LOCATION:  3033                         FACILITY:  [facility]   PHYSICIAN:  Hewitt Shorts, M.D.DATE OF BIRTH:  04-Sep-1960   DATE OF ADMISSION:  01/11/2007  DATE OF DISCHARGE:  01/16/2007                               DISCHARGE SUMMARY   HISTORY OF PRESENT ILLNESS:  The patient is a 50 year old woman under  the care of Dr. Lovell Sheehan.  He performed previous C5-6 to C6-7 anterior  cervical diskectomy and fusions.  She had a new disc herniation at C7-T1  and he admitted her for surgery for that with removal of the old plate.  Details of her history and admission physical examination are included  in Dr. Lovell Sheehan admission and history physical note.   HOSPITAL COURSE:  The patient was admitted and underwent a C7-T1  anterior cervical diskectomy and fusion with antibody implant and bone  graft with removal of her previous plate.  Following surgery, she did  fairly well.  She did have some difficulties with nausea and vomiting  that improved with Vistaril and she has been able to gradually increase  her ambulation and activities.  Her wound is healing nicely.  She has  been instructed to return to see Dr. Lovell Sheehan in 3 weeks for a followup.  She is given a prescription for Percocet 1-2 tablets p.o. q.4-6 hours  p.r.n. pain, 50 tablets prescribed, no refills.   DISCHARGE DIAGNOSIS:  Cervical disc herniation.      Hewitt Shorts, M.D.  Electronically Signed     RWN/MEDQ  D:  01/16/2007  T:  01/16/2007  Job:  [id number]

## 2010-10-02 NOTE — Group Therapy Note (Signed)
HISTORY OF PRESENT ILLNESS:  This 50 year old female with at least a 19 year  history of back and neck pain.  Her main complaint is low back as well as  lower extremity pain at the time, greater than neck pain.  She complains of  intrascapular pain as well.  She states that the pain is worse with walking,  bending, standing.  Her medications give her about a 60% relief.  She sleeps  poorly.  She walks up to 10 minutes straight and climbs steps, but states  only 4-5 at time.  She drives and she will use a cane or a walker,  occasionally a wheelchair.  She was last employed 2005 and is currently  applying for disability and has an attorney representing her.  She is  independent with feeding, dressing, bathing, toileting, but needs assistance  with meal prep, household duties and shopping.   REVIEW OF SYSTEMS:  Positive for numbness and trouble walking, spasms,  confusion, depression, anxiety, but denies suicidal thoughts.  Has poor  appetite.   She has had recent cervical CT myelograms dated April 15, 2005, Some OPLL  at C5-6.  No other neurocompression.  Mild degenerative changes at C4-5.  Lumbar shows good positioning of pedicle screws.  No evidence of  pseudoarthrosis.  No significant adjacent segment problems at L1-2.   She has also had bilateral hip MRI's dated March 2006 showing some  trochanteric bursitis, otherwise, no significant intra-articular problems.   She has had, in April 2006, a redo laminectomy at L2-3, L3-4, as well as L2  through S1 instrument fusion.  In 2005, she had ACDF of C5 through C7.  In  2004, L2-3 microdiskectomy.  In 2001, L4-S1 fusion.   Her other past medical history is fairly unremarkable.  No hypertension,  diabetes or coronary artery disease.   FAMILY HISTORY:  Hypertension, coronary artery disease and diabetes, as well  as disability.   SOCIAL HISTORY:  Divorced, lives with his son.   PHYSICAL EXAMINATION:  VITAL SIGNS:  Blood pressure  111/54, pulse 86,  respirations 16, O2 saturation 98% on room air.  BACK:  No tenderness to palpation in the cervical, thoracic, paraspinals.  Had some mild tenderness to palpation L3, L4 and L5 paraspinals.  On PSIS  tenderness.  No hip tenderness.  EXTREMITIES:  She has decreased range of motion in terms of abduction in the  left shoulder and left forward flexor of the shoulder approximately 90  degrees.  Right upper extremity no limitations.  Bilateral lower extremity  no limitations.  She had negative Phalen's.  She has normal muscle bulk in  the upper extremities and lower extremities.  No fasciculations.  She has  good strength bilateral upper and lower extremities.  No focal weakness.  Sensation is intact bilateral upper and lower extremities.   IMPRESSION:  1.  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  2.  Cervical post laminectomy syndrome.  3.  Left frozen shoulder.  4.  Anxiety and depression related to chronic pain.   RECOMMENDATIONS:  1.  Would increase gabapentin to 600 t.i.d.  May need to go up to q.i.d.  2.  Continue Tramadol as is two p.o. q.6 h p.r.n.  3.  Will switch her from Oxy-CR to Duragesic patch, if urinary drug      screening shows no evidence of illegal drug usage or nondisclosed      narcotic analgesic usage.  4.  She may need EMG of lower extremities.  5.  She may benefit from caudal epidural steroid injections.  Will discuss      this at next visit in more detail.  6.  I do not thing cervical injections are needed at this time.      Erick Colace, M.D.  Electronically Signed     AEK/MedQ  D:  05/25/2005 11:11:30  T:  05/25/2005 12:13:06  Job #:  [id number]   cc:   Cristi Loron, M.D.  Fax: [phone]   Mila Homer. Sherlean Foot, M.D.  Fax: [phone]   Windle Guard, M.D.  Fax: [phone]

## 2010-10-06 ENCOUNTER — Other Ambulatory Visit (HOSPITAL_COMMUNITY): Payer: Medicare Other | Admitting: Radiology

## 2010-10-07 ENCOUNTER — Ambulatory Visit (HOSPITAL_COMMUNITY)
Admission: RE | Admit: 2010-10-07 | Payer: Medicare Other | Source: Ambulatory Visit | Admitting: Obstetrics and Gynecology

## 2010-12-20 ENCOUNTER — Emergency Department (HOSPITAL_COMMUNITY): Payer: Medicare Other

## 2010-12-20 ENCOUNTER — Emergency Department (HOSPITAL_COMMUNITY)
Admission: EM | Admit: 2010-12-20 | Discharge: 2010-12-20 | Disposition: A | Discharge status: Home / Self Care | Payer: Medicare Other | Attending: Emergency Medicine | Admitting: Emergency Medicine

## 2010-12-20 DIAGNOSIS — M79609 Pain in unspecified limb: Secondary | ICD-10-CM | POA: Insufficient documentation

## 2010-12-20 DIAGNOSIS — W1789XA Other fall from one level to another, initial encounter: Secondary | ICD-10-CM | POA: Insufficient documentation

## 2010-12-20 DIAGNOSIS — Y92009 Unspecified place in unspecified non-institutional (private) residence as the place of occurrence of the external cause: Secondary | ICD-10-CM | POA: Insufficient documentation

## 2010-12-20 DIAGNOSIS — T148XXA Other injury of unspecified body region, initial encounter: Secondary | ICD-10-CM | POA: Insufficient documentation

## 2010-12-20 DIAGNOSIS — M25529 Pain in unspecified elbow: Secondary | ICD-10-CM | POA: Insufficient documentation

## 2010-12-20 DIAGNOSIS — S060X9A Concussion with loss of consciousness of unspecified duration, initial encounter: Secondary | ICD-10-CM | POA: Insufficient documentation

## 2011-02-26 LAB — ABO/RH: ABO/RH(D): A POS

## 2011-02-26 LAB — BASIC METABOLIC PANEL
BUN: 9
Calcium: 8.9
Creatinine, Ser: 0.66
GFR calc non Af Amer: >60
Glucose, Bld: 102 — ABNORMAL HIGH

## 2011-02-26 LAB — HEPATIC FUNCTION PANEL
AST: 24
Albumin: 3.3 — ABNORMAL LOW
Bilirubin, Direct: <0.1
Total Protein: 7.3

## 2011-02-26 LAB — CBC
HCT: 31 — ABNORMAL LOW
Platelets: 358
RDW: 14.8 — ABNORMAL HIGH
WBC: 9.5

## 2011-02-26 LAB — TYPE AND SCREEN: Antibody Screen: NEGATIVE

## 2011-03-31 ENCOUNTER — Other Ambulatory Visit: Payer: Self-pay | Admitting: Internal Medicine

## 2011-04-09 ENCOUNTER — Telehealth: Payer: Self-pay | Admitting: Internal Medicine

## 2011-04-09 NOTE — Telephone Encounter (Signed)
I spoke with the pt and she is requesting an appt next week to f/u with Dr. Sherene Sires but she has transportation issues. She states she can only come in on Wed in PM next week, so appt set for 04-14-11 at 1:30pm. She states she will call back if she cannot keep this appt. I advised the pt if she feels she needs to be sen sooner and transportation is an issue to call EMS to go to hospital. Carron Curie, CMA

## 2011-04-14 ENCOUNTER — Ambulatory Visit: Payer: Medicare Other | Admitting: Internal Medicine

## 2011-05-03 ENCOUNTER — Ambulatory Visit: Payer: Medicare Other | Admitting: Internal Medicine

## 2011-06-07 ENCOUNTER — Other Ambulatory Visit: Payer: Self-pay | Admitting: Neurosurgery

## 2011-06-07 DIAGNOSIS — M542 Cervicalgia: Secondary | ICD-10-CM

## 2011-06-07 DIAGNOSIS — M5410 Radiculopathy, site unspecified: Secondary | ICD-10-CM

## 2011-06-10 ENCOUNTER — Ambulatory Visit
Admission: RE | Admit: 2011-06-10 | Discharge: 2011-06-10 | Disposition: A | Discharge status: Home / Self Care | Payer: Medicare Other | Source: Ambulatory Visit | Attending: Neurosurgery | Admitting: Neurosurgery

## 2011-06-10 DIAGNOSIS — M542 Cervicalgia: Secondary | ICD-10-CM

## 2011-06-10 DIAGNOSIS — M5410 Radiculopathy, site unspecified: Secondary | ICD-10-CM

## 2011-06-11 ENCOUNTER — Other Ambulatory Visit: Payer: Medicare Other

## 2011-06-30 IMAGING — CR DG CHEST 2V
3 series · 3 of 3 positions shown · non-contrast
Comparison: 08/13/2010

CLINICAL DATA: Shortness of breath.  Nonsmoker

CHEST - 2 VIEW

[view not recorded (1 of 3)]
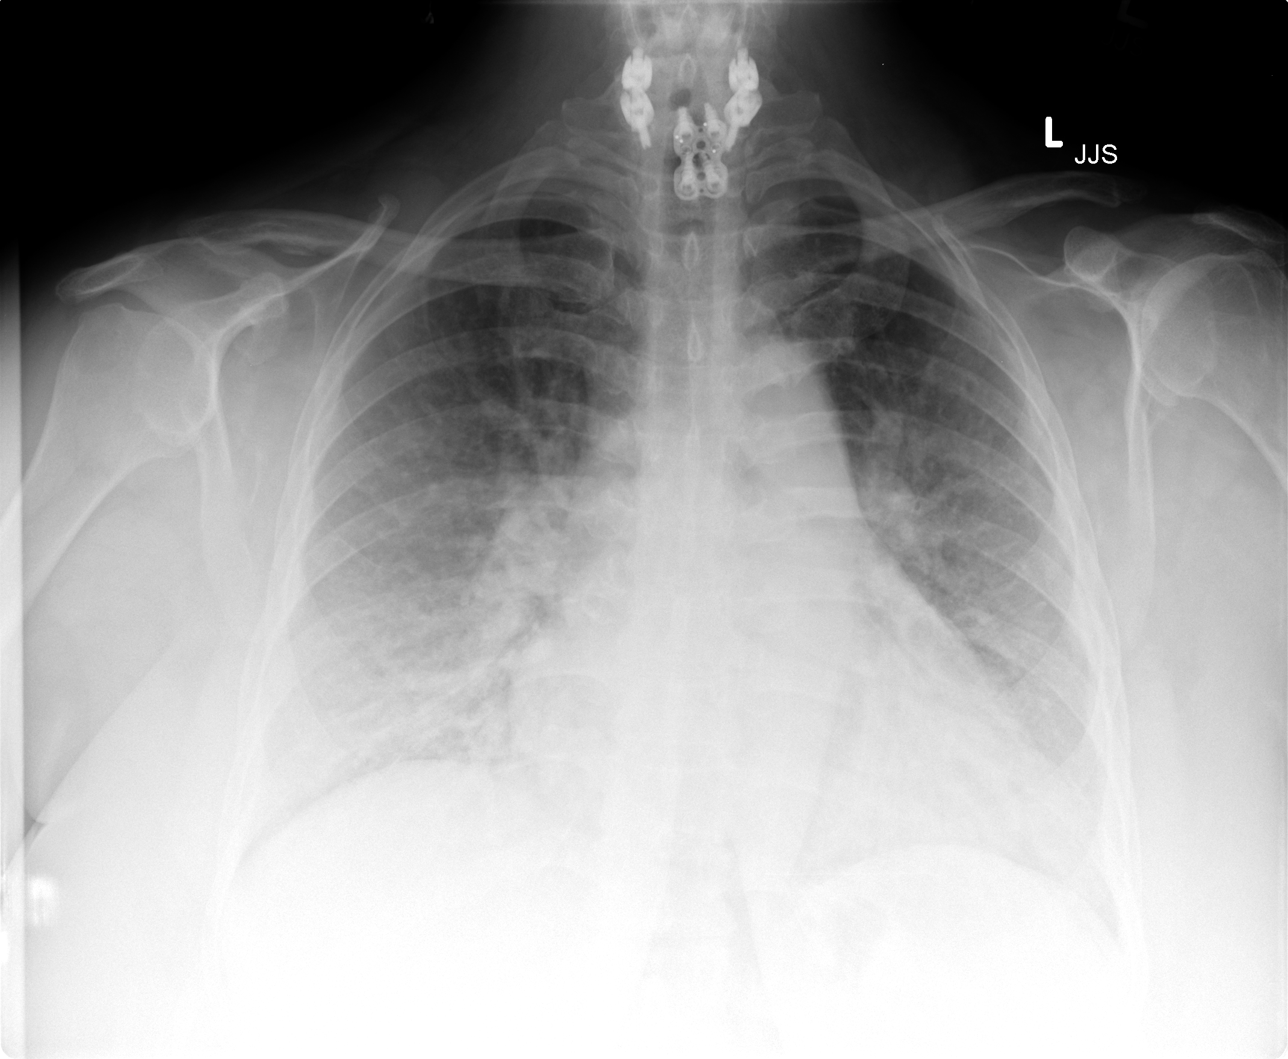

[view not recorded (2 of 3)]
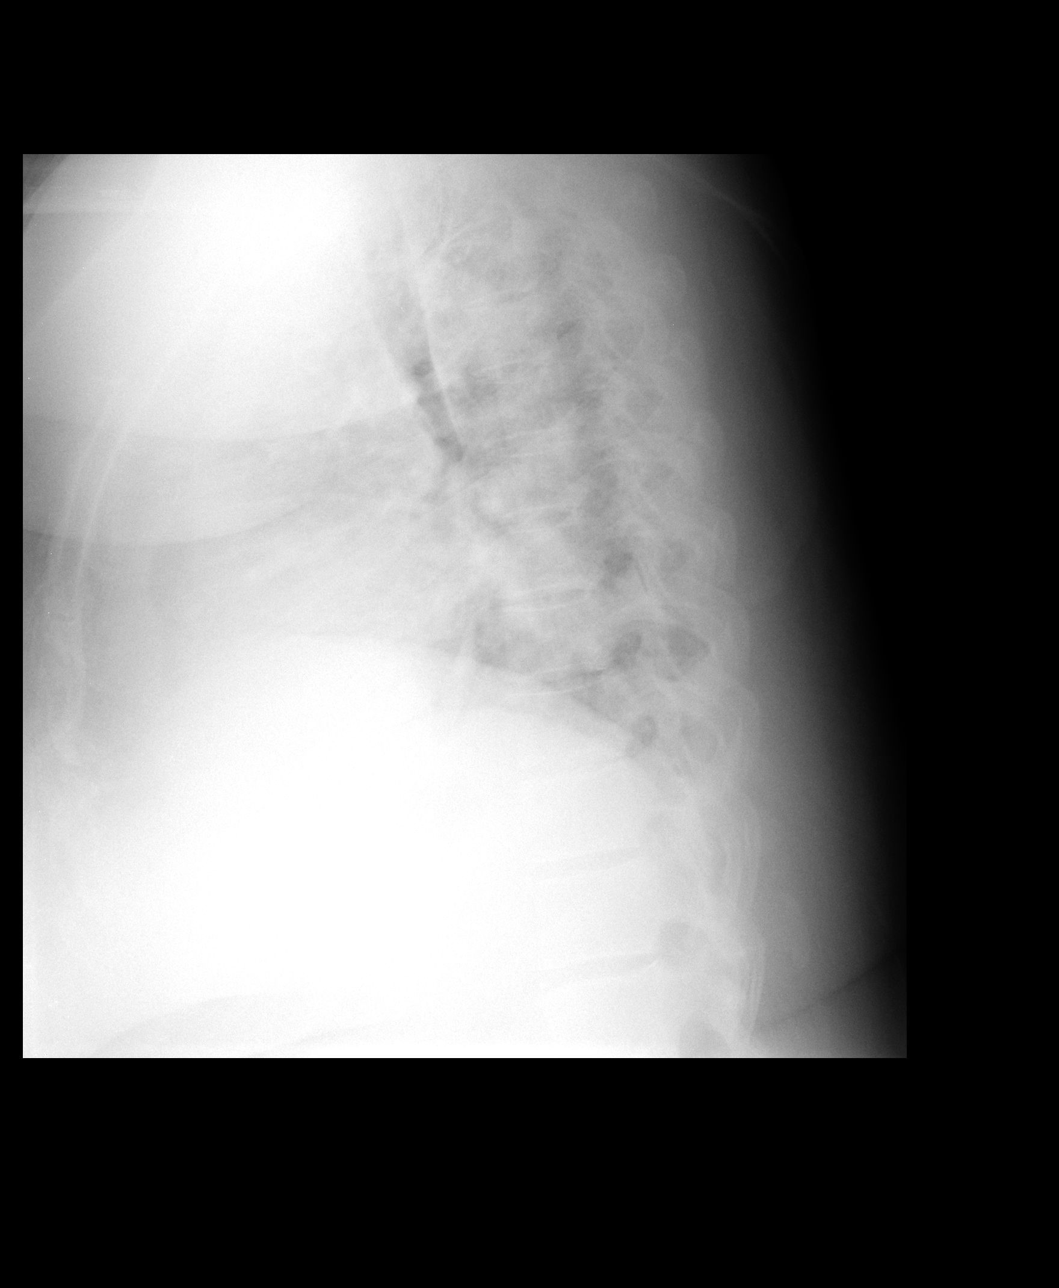

[view not recorded (3 of 3)]
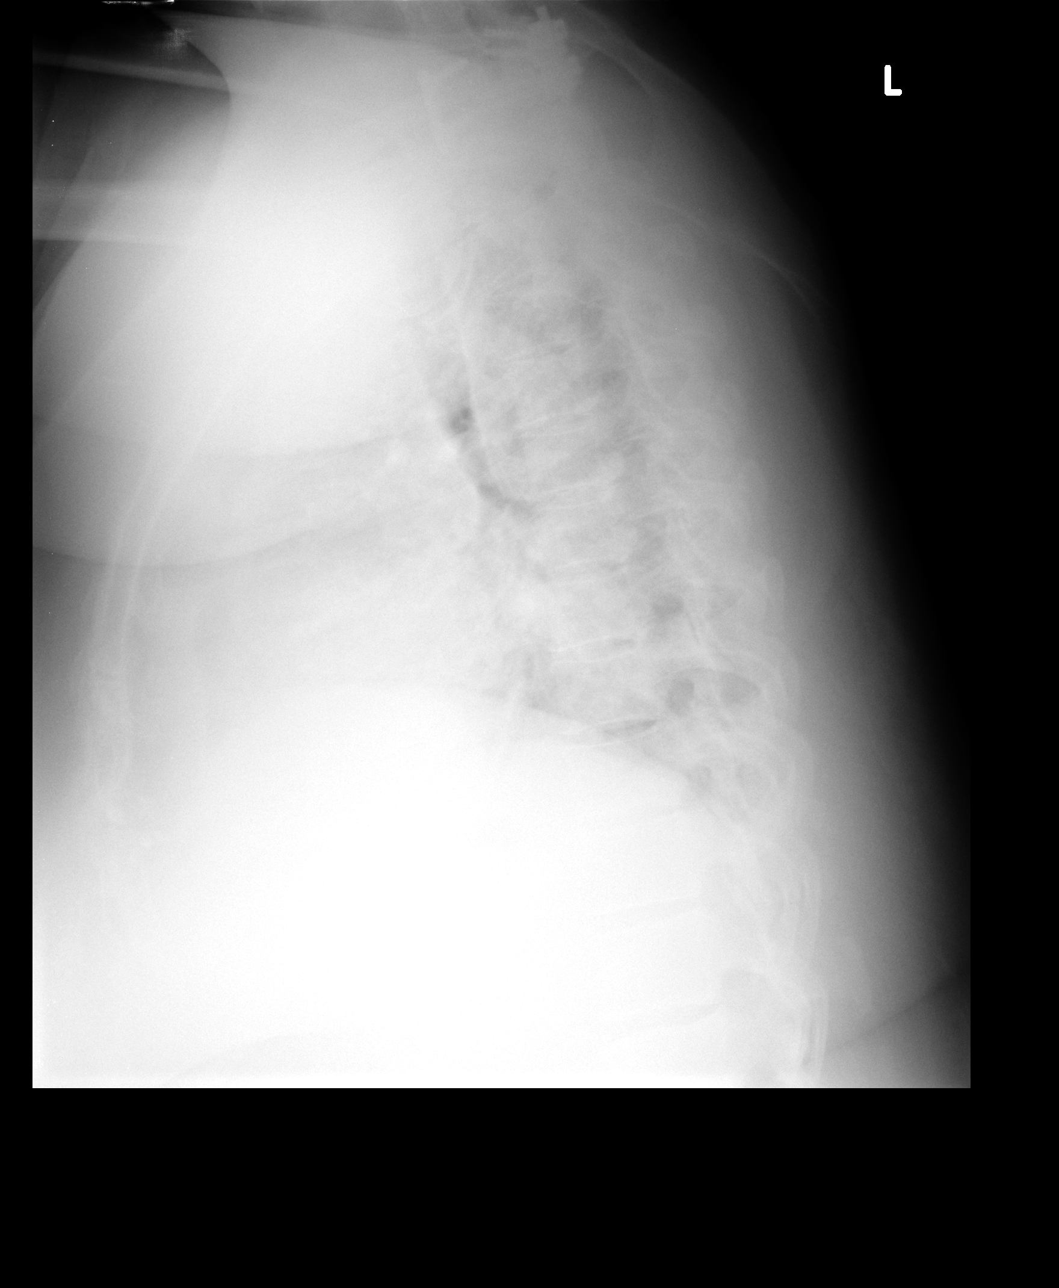

[3 of 3 positions shown; findings below may reference images not displayed]

FINDINGS: A stable degree of cardiomegaly is identified.  The lung
fields are notable for pulmonary vascular congestion, patchy
perihilar and bilateral lower lobe densities.  The appearance today
is similar to that seen in the right hemithorax on the prior exam
but is more symmetric in nature. This appearance along with the
cardiomegaly noted is suspicious for underlying pulmonary edema.
No discrete interstitial septal lines or pleural fluid is noted.

Bony structures appear intact.  Screw plate fixation of the lower
cervical spine is again noted.
IMPRESSION: Findings suspicious for pulmonary edema with a more symmetric
appearance present than on the prior exam.

## 2011-08-14 ENCOUNTER — Emergency Department: Payer: Self-pay | Admitting: Emergency Medicine

## 2011-09-04 ENCOUNTER — Other Ambulatory Visit: Payer: Self-pay | Admitting: Internal Medicine

## 2011-09-09 ENCOUNTER — Other Ambulatory Visit: Payer: Self-pay | Admitting: Internal Medicine

## 2011-10-02 IMAGING — CR DG KNEE COMPLETE 4+V*R*
4 series · 4 of 4 positions shown · non-contrast
Comparison: None

CLINICAL DATA: Fall, right knee pain.

RIGHT KNEE - COMPLETE 4+ VIEW

[view not recorded (1 of 4)]
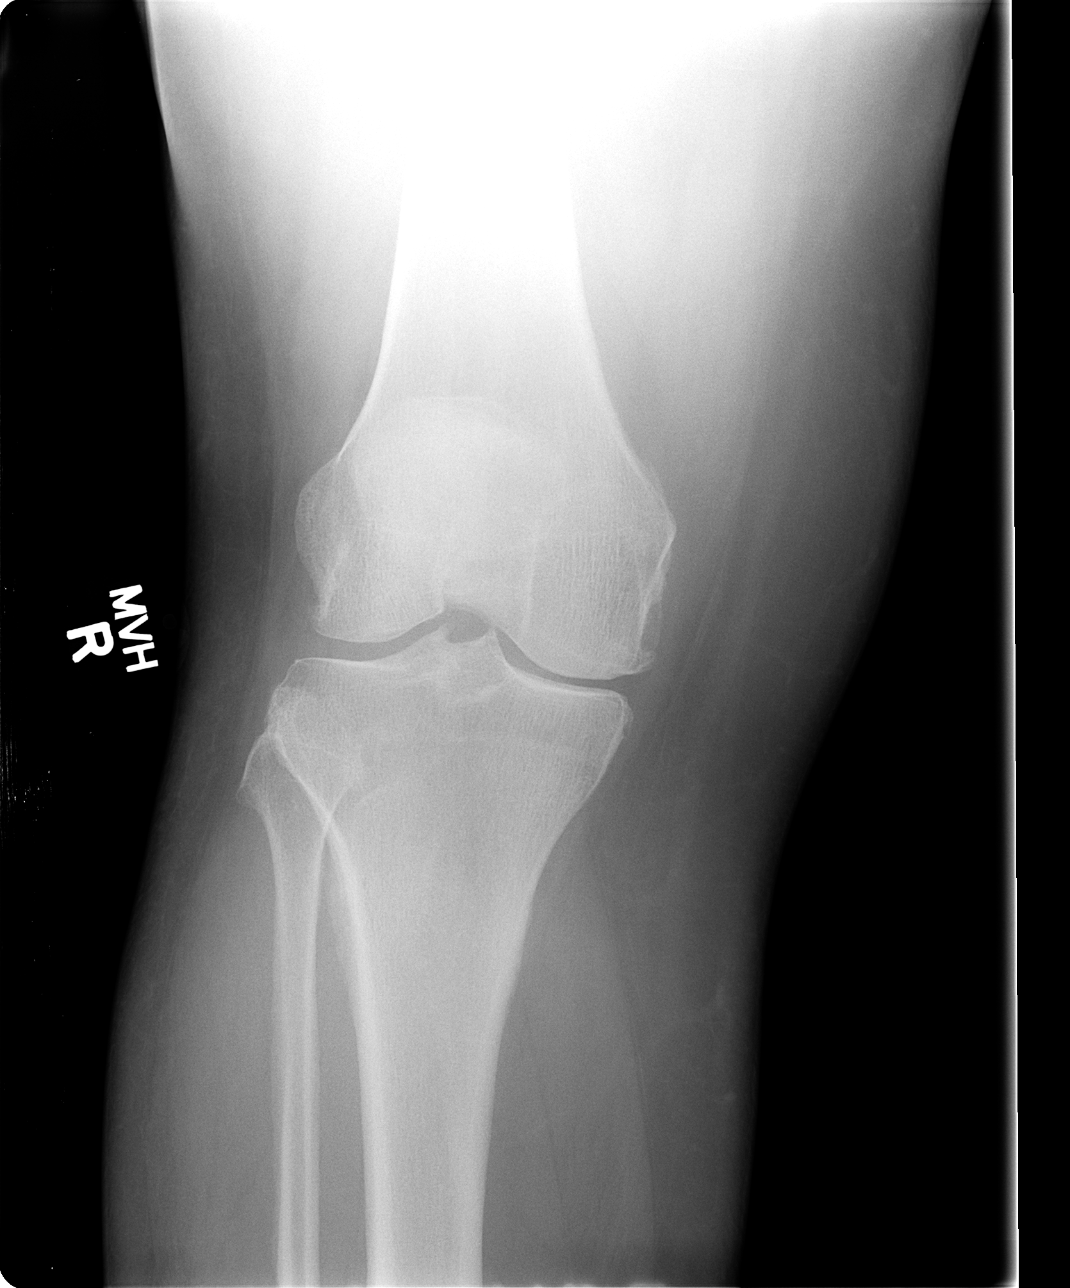

[view not recorded (2 of 4)]
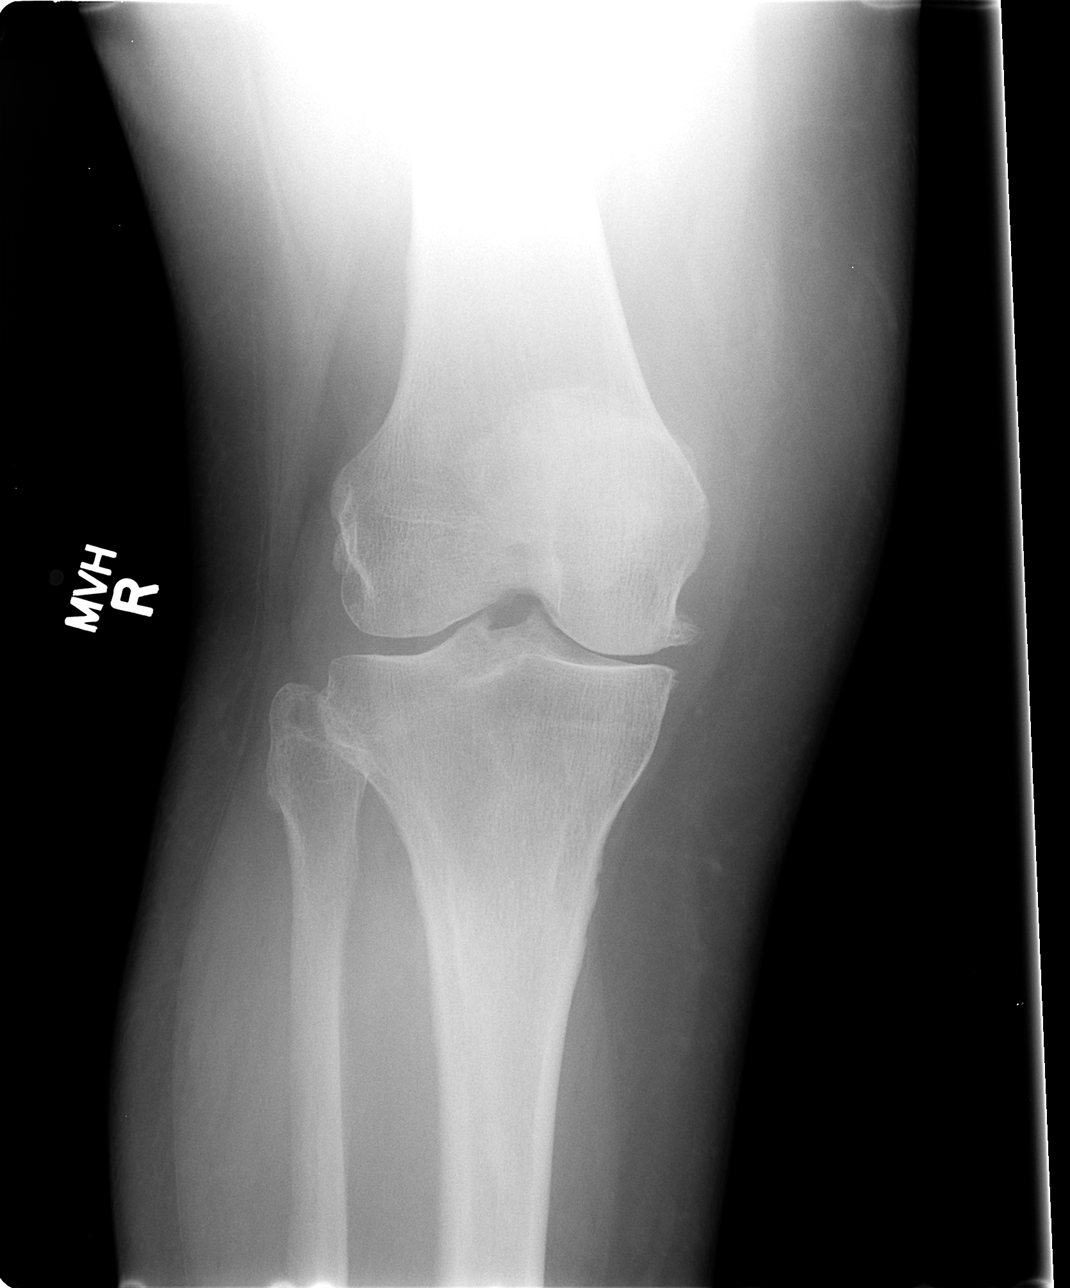

[view not recorded (3 of 4)]
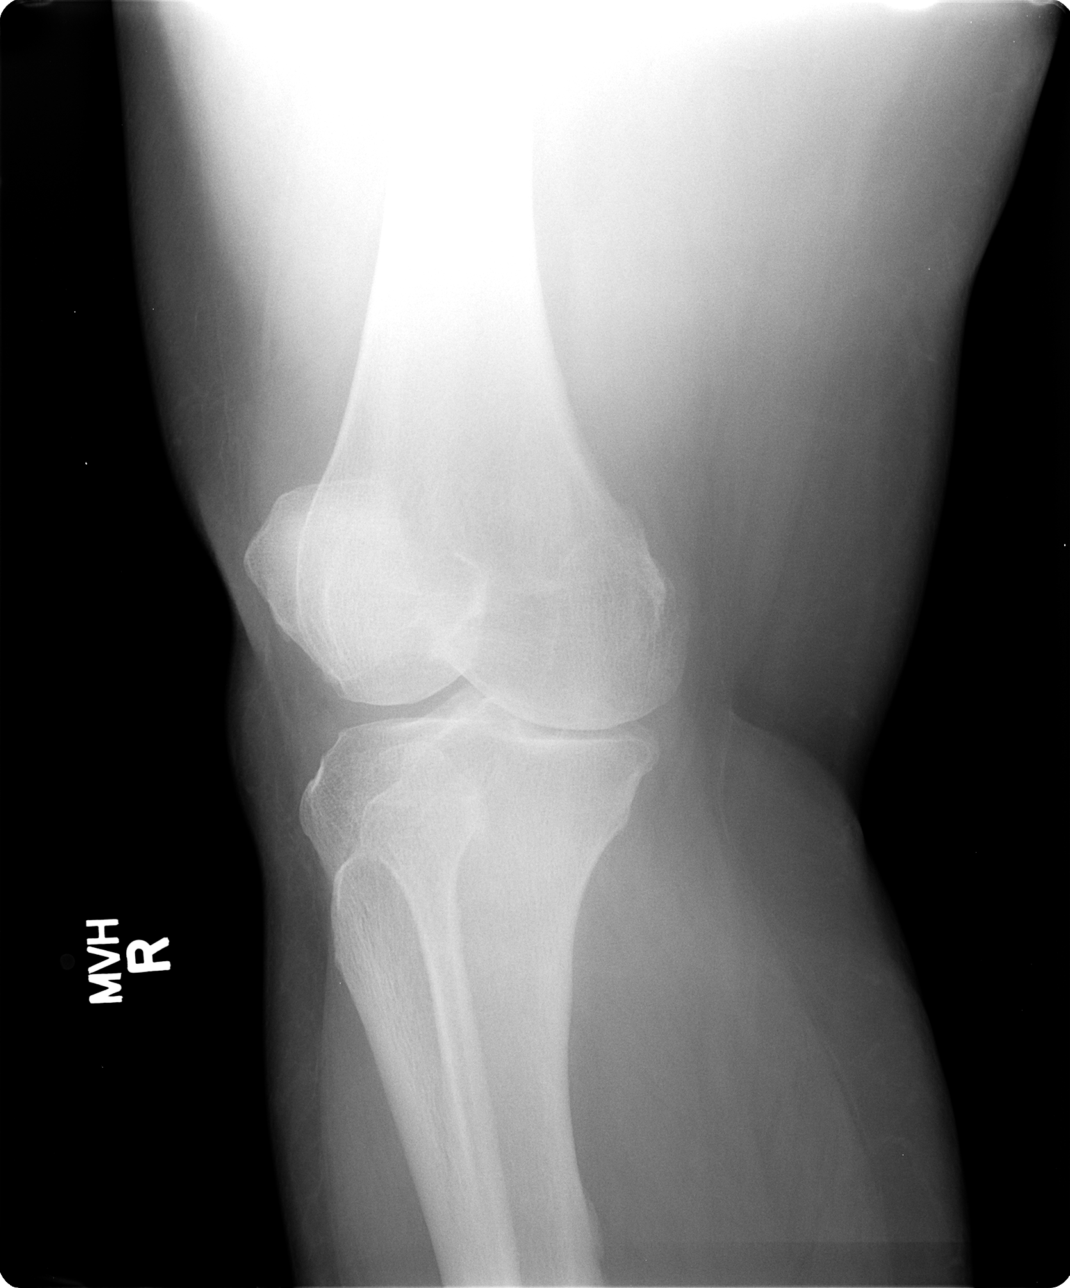

[view not recorded (4 of 4)]
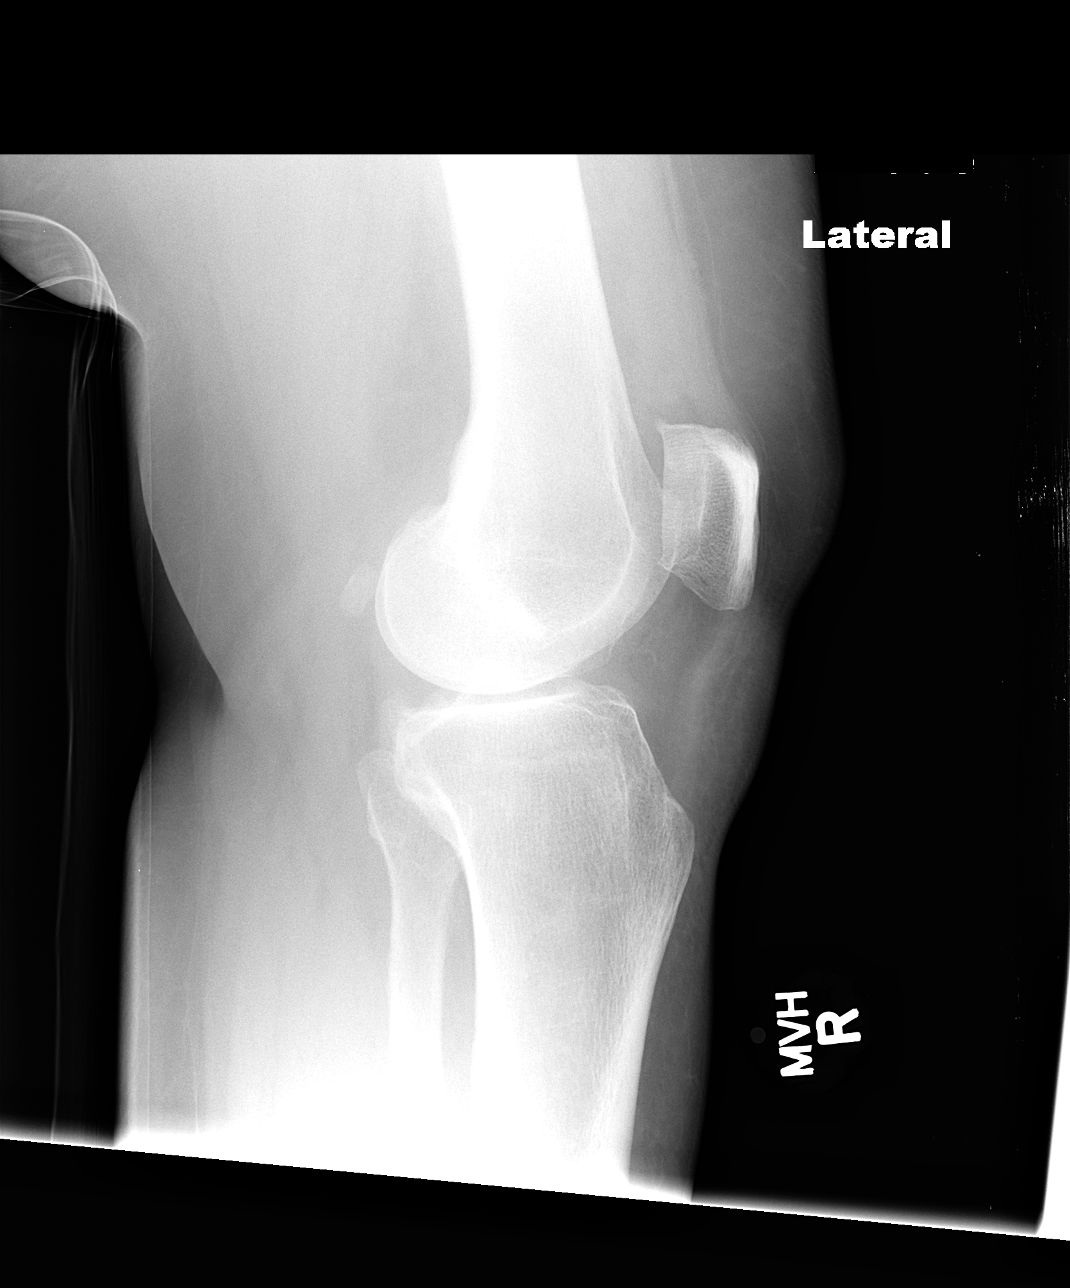

[4 of 4 positions shown; findings below may reference images not displayed]

FINDINGS: Mild degenerative changes in the right knee.  Small joint
effusion. No acute bony abnormality.  Specifically, no fracture,
subluxation, or dislocation.  Soft tissues are intact.
IMPRESSION: No acute bony abnormality.

## 2012-08-31 ENCOUNTER — Emergency Department: Payer: Self-pay | Admitting: Emergency Medicine

## 2012-11-29 ENCOUNTER — Ambulatory Visit (INDEPENDENT_AMBULATORY_CARE_PROVIDER_SITE_OTHER)
Admission: RE | Admit: 2012-11-29 | Discharge: 2012-11-29 | Disposition: A | Discharge status: Home / Self Care | Payer: Medicare Other | Source: Ambulatory Visit | Attending: Internal Medicine | Admitting: Internal Medicine

## 2012-11-29 ENCOUNTER — Ambulatory Visit (INDEPENDENT_AMBULATORY_CARE_PROVIDER_SITE_OTHER): Payer: Medicare Other | Admitting: Internal Medicine

## 2012-11-29 ENCOUNTER — Encounter: Payer: Self-pay | Admitting: Internal Medicine

## 2012-11-29 ENCOUNTER — Other Ambulatory Visit: Payer: Self-pay | Admitting: Family Medicine

## 2012-11-29 VITALS — BP 130/68 | HR 82 | Temp 97.9°F | Ht 65.0 in | Wt 203.4 lb

## 2012-11-29 DIAGNOSIS — R0602 Shortness of breath: Secondary | ICD-10-CM

## 2012-11-29 DIAGNOSIS — G43909 Migraine, unspecified, not intractable, without status migrainosus: Secondary | ICD-10-CM

## 2012-11-29 MED ORDER — FAMOTIDINE 20 MG PO TABS: 20.0000 mg | ORAL_TABLET | Freq: Every day | ORAL | Status: AC

## 2012-11-29 MED ORDER — PANTOPRAZOLE SODIUM 40 MG PO TBEC: DELAYED_RELEASE_TABLET | ORAL | Status: DC

## 2012-11-29 NOTE — Progress Notes (Signed)
Subjective:    Patient ID: [name], female    DOB: 1960-06-26 .   MRN: [id number]    41 yowf never smoker with h/o hives to bees and fish "throat closes up" rx with epipen in past.  08/13/10 Initial  pulmonary office eval  For ex tol good in 1992 with back surgery eventually disabled in 2008 new cc 8 months indolent onset doe  X  Walking across trailer 75 ft and also 3 months sob sitting still and lying down, better sitting if leaning forward.  Better on albuterol tablets.  No assoc cough.  No major change in wt related to new symptoms. Cardiac w/u neg per Nasher per pt/ echo ok 08/20/10 Hgb varies but does not correlate with doe     rec lasix trial as ? Edema on ct chest > symptoms improved and did not need further diuretic rx     11/29/2012 f/u ov/[name]  Chief Complaint  Patient presents with  . Acute Visit    Pt c/o increased SOB x 1 month. She states only able to take in a deep breath unless she is bending over.   similar to first ov, sense can't get deep breath @ rest.   Hurts to takes deep breath with pain in center, better on 02 - onset was gradual, and assoc with loss of voice and needed to clear throat no longer on pepcid and protonix.   worse breathing after stopped albuterol tablets one year prior to OV   so maintained on chronically -   No obvious daytime variabilty or assoc chronic cough or cp or chest tightness, subjective wheeze overt sinus or hb symptoms. No unusual exp hx or h/o childhood pna/ asthma or knowledge of premature birth.   Sleeping ok without nocturnal  or early am exacerbation  of respiratory  c/o's or need for noct saba. Also denies any obvious fluctuation of symptoms with weather or environmental changes or other aggravating or alleviating factors except as outlined above   Current Medications, Allergies, Past Medical History, Past Surgical History, Family History, and Social History were reviewed in [employer] record.  ROS  The  following are not active complaints unless bolded sore throat, dysphagia, dental problems, itching, sneezing,  nasal congestion or excess/ purulent secretions, ear ache,   fever, chills, sweats, unintended wt loss, pleuritic or exertional cp, hemoptysis,  orthopnea pnd or leg swelling, presyncope, palpitations, heartburn, abdominal pain, anorexia, nausea, vomiting, diarrhea  or change in bowel or urinary habits, change in stools or urine, dysuria,hematuria,  rash, arthralgias, visual complaints, headache, numbness weakness or ataxia or problems with walking or coordination,  change in mood/affect or memory.            PMHx "Bone Marrow Dz" .......................................................Marland KitchenEnnever Morbid Obeisty        -  PFT's 07/08/10  VC  63%  DLC0 89% Unexplained sob .......................................................Marland KitchenWert         -Desats p 50 ft walking 08/14/10 > resolved 09/17/2010          -CT Chest 08/14/10 c/w edema         - ESR 49  08/14/10  >  37 09/17/2010          - ECHO  Ok 08/20/10   .        Objective:   Physical Exam   Obese   wf nad  Wt Readings from Last 3 Encounters:  11/29/12 203 lb 6.4 oz (92.262 kg)  08/13/10  284 lb (128.822 kg)     HEENT: nl dentition, turbinates, and orophanx. Nl external ear canals without cough reflex   NECK :  without JVD/Nodes/TM/ nl carotid upstrokes bilaterally   LUNGS: no acc muscle use, clear to A and P bilaterally without cough on insp or exp maneuvers   CV:  RRR  no s3 or murmur or increase in P2, no edema   ABD: massively obese but soft and nontender with very limited  excursion in the supine position. No bruits or organomegaly, bowel sounds nl  MS:  warm without deformities, calf tenderness, cyanosis or clubbing  SKIN: warm and dry without lesions    NEURO:  alert, approp, no deficits      CXR  11/29/2012 :  No active lung disease. Stable borderline cardiomegaly     Assessment & Plan:

## 2012-11-29 NOTE — Patient Instructions (Addendum)
Pantoprazole (protonix) 40 mg   Take 30-60 min before first meal of the day and Pepcid 20 mg one bedtime until return to office - this is the best way to tell whether stomach acid is contributing to your problem and if better try wean off albuterol tablets and if you get worse then return here.     Please remember to go to the x-ray department downstairs for your tests - we will call you with the results when they are available.  GERD (REFLUX)  is an extremely common cause of respiratory symptoms just like yours, many times with no significant heartburn at all.    It can be treated with medication, but also with lifestyle changes including avoidance of late meals, excessive alcohol, smoking cessation, and avoid fatty foods, chocolate, peppermint, colas, red wine, and acidic juices such as orange juice.  NO MINT OR MENTHOL PRODUCTS SO NO COUGH DROPS  USE SUGARLESS CANDY INSTEAD (jolley ranchers or Stover's)  NO OIL BASED VITAMINS - use powdered substitutes.  CBC, bmet, bnp and esr  Needed to complete the work up for your breathing.

## 2012-11-29 NOTE — Progress Notes (Signed)
Quick Note:  Spoke with pt and notified of results per Dr. Wert. Pt verbalized understanding and denied any questions.  ______ 

## 2012-12-01 ENCOUNTER — Ambulatory Visit
Admission: RE | Admit: 2012-12-01 | Discharge: 2012-12-01 | Disposition: A | Discharge status: Home / Self Care | Payer: Medicare Other | Source: Ambulatory Visit | Attending: Family Medicine | Admitting: Family Medicine

## 2012-12-01 DIAGNOSIS — G43909 Migraine, unspecified, not intractable, without status migrainosus: Secondary | ICD-10-CM

## 2012-12-02 NOTE — Assessment & Plan Note (Addendum)
Restrictive PFT's c/w obesity 07/08/10 Desats with ex 08/13/2010  > resolved 09/17/2010 p lasix CT angiogram ordered 08/13/2010  >  gg changes ? Edema ESR 49 08/17/2010  > 37 09/17/2010  ECHO ok 08/20/10 - 11/29/2012   Walked RA x one lap @ 185 stopped due to  Legs got tired, no desat   Needs labs to complete the w/u (see instructions)  But symptoms are markedly disproportionate to objective findings and not clear this is a lung problem but pt does appear to have difficult airway management issues.  DDX of  difficult airways managment all start with A and  include Adherence, Ace Inhibitors, Acid Reflux, Active Sinus Disease, Alpha 1 Antitripsin deficiency, Anxiety masquerading as Airways dz,  ABPA,  allergy(esp in young), Aspiration (esp in elderly), Adverse effects of DPI,  Active smokers, plus two Bs  = Bronchiectasis and Beta blocker use..and one C= CHF  Acid reflux related to obesity most likely the reason for recent exac though the effects of obesity on lung vol continue to be an issue despite wt loss since previous eval.  See instructions for specific recommendations which were reviewed directly with the patient who was given a copy with highlighter outlining the key components.

## 2012-12-21 ENCOUNTER — Encounter: Payer: Self-pay | Admitting: Cardiovascular Disease

## 2013-02-12 ENCOUNTER — Other Ambulatory Visit: Payer: Self-pay | Admitting: Neurosurgery

## 2013-02-12 DIAGNOSIS — M549 Dorsalgia, unspecified: Secondary | ICD-10-CM

## 2013-02-14 ENCOUNTER — Ambulatory Visit
Admission: RE | Admit: 2013-02-14 | Discharge: 2013-02-14 | Disposition: A | Discharge status: Home / Self Care | Payer: Medicare Other | Source: Ambulatory Visit | Attending: Neurosurgery | Admitting: Neurosurgery

## 2013-02-14 DIAGNOSIS — M549 Dorsalgia, unspecified: Secondary | ICD-10-CM

## 2013-02-14 MED ORDER — GADOBENATE DIMEGLUMINE 529 MG/ML IV SOLN
15.0000 mL | Freq: Once | INTRAVENOUS | Status: AC | PRN
  Administered 2013-02-14: 15 mL via INTRAVENOUS

## 2014-02-26 ENCOUNTER — Other Ambulatory Visit: Payer: Self-pay | Admitting: Surgical

## 2014-02-28 ENCOUNTER — Encounter (HOSPITAL_COMMUNITY): Payer: Self-pay | Admitting: Pharmacy Technician

## 2014-03-06 ENCOUNTER — Encounter (HOSPITAL_COMMUNITY): Payer: Self-pay

## 2014-03-06 ENCOUNTER — Other Ambulatory Visit (HOSPITAL_COMMUNITY): Payer: Self-pay | Admitting: *Deleted

## 2014-03-07 ENCOUNTER — Encounter (HOSPITAL_COMMUNITY): Payer: Self-pay

## 2014-03-07 ENCOUNTER — Encounter (HOSPITAL_COMMUNITY)
Admission: RE | Admit: 2014-03-07 | Discharge: 2014-03-07 | Disposition: A | Discharge status: Home / Self Care | Payer: Medicare Other | Source: Ambulatory Visit | Attending: Orthopedic Surgery | Admitting: Orthopedic Surgery

## 2014-03-07 ENCOUNTER — Ambulatory Visit (HOSPITAL_COMMUNITY)
Admission: RE | Admit: 2014-03-07 | Discharge: 2014-03-07 | Disposition: A | Discharge status: Home / Self Care | Payer: Medicare Other | Source: Ambulatory Visit | Attending: Anesthesiology | Admitting: Anesthesiology

## 2014-03-07 DIAGNOSIS — R0602 Shortness of breath: Secondary | ICD-10-CM | POA: Insufficient documentation

## 2014-03-07 DIAGNOSIS — I517 Cardiomegaly: Secondary | ICD-10-CM | POA: Insufficient documentation

## 2014-03-07 DIAGNOSIS — Z01810 Encounter for preprocedural cardiovascular examination: Secondary | ICD-10-CM | POA: Insufficient documentation

## 2014-03-07 DIAGNOSIS — M797 Fibromyalgia: Secondary | ICD-10-CM | POA: Insufficient documentation

## 2014-03-07 DIAGNOSIS — R0600 Dyspnea, unspecified: Secondary | ICD-10-CM

## 2014-03-07 HISTORY — DX: Adverse effect of unspecified anesthetic, initial encounter: T41.45XA

## 2014-03-07 HISTORY — DX: Anemia, unspecified: D64.9

## 2014-03-07 HISTORY — DX: Disease of blood and blood-forming organs, unspecified: D75.9

## 2014-03-07 HISTORY — DX: Personal history of other diseases of the respiratory system: Z87.09

## 2014-03-07 HISTORY — DX: Headache, unspecified: R51.9

## 2014-03-07 HISTORY — DX: Personal history of diseases of the blood and blood-forming organs and certain disorders involving the immune mechanism: Z86.2

## 2014-03-07 HISTORY — DX: Nausea with vomiting, unspecified: R11.2

## 2014-03-07 HISTORY — DX: Other specified postprocedural states: Z98.890

## 2014-03-07 HISTORY — DX: Anesthesia of skin: R20.0

## 2014-03-07 HISTORY — DX: Anxiety disorder, unspecified: F41.9

## 2014-03-07 HISTORY — DX: Polyneuropathy, unspecified: G62.9

## 2014-03-07 HISTORY — DX: Fibromyalgia: M79.7

## 2014-03-07 HISTORY — DX: Unspecified osteoarthritis, unspecified site: M19.90

## 2014-03-07 HISTORY — DX: Headache: R51

## 2014-03-07 LAB — COMPREHENSIVE METABOLIC PANEL
ALBUMIN: 3.8 g/dL (ref 3.5–5.2)
ALT: 7 U/L (ref 0–35)
ANION GAP: 9 (ref 5–15)
AST: 15 U/L (ref 0–37)
Alkaline Phosphatase: 126 U/L — ABNORMAL HIGH (ref 39–117)
BUN: 12 mg/dL (ref 6–23)
CO2: 27 mEq/L (ref 19–32)
CREATININE: 0.78 mg/dL (ref 0.50–1.10)
Calcium: 9.3 mg/dL (ref 8.4–10.5)
Chloride: 104 mEq/L (ref 96–112)
GFR calc Af Amer: >90 mL/min (ref >90)
Glucose, Bld: 97 mg/dL (ref 70–99)
Potassium: 4.9 mEq/L (ref 3.7–5.3)
Sodium: 140 mEq/L (ref 137–147)
Total Bilirubin: <0.2 mg/dL — ABNORMAL LOW (ref 0.3–1.2)
Total Protein: 7.8 g/dL (ref 6.0–8.3)

## 2014-03-07 LAB — CBC
HEMATOCRIT: 38.2 % (ref 36.0–46.0)
Hemoglobin: 12.1 g/dL (ref 12.0–15.0)
MCH: 27.1 pg (ref 26.0–34.0)
MCHC: 31.7 g/dL (ref 30.0–36.0)
MCV: 85.5 fL (ref 78.0–100.0)
PLATELETS: 310 10*3/uL (ref 150–400)
RBC: 4.47 MIL/uL (ref 3.87–5.11)
RDW: 13.1 % (ref 11.5–15.5)
WBC: 6.6 10*3/uL (ref 4.0–10.5)

## 2014-03-07 NOTE — Patient Instructions (Addendum)
[name]  03/07/2014                           YOUR PROCEDURE IS SCHEDULED ON:  03/13/14                ENTER FROM [address] - ENTER THRU EMERGENCY ENTRANCE                            FOLLOW  SIGNS TO SHORT STAY CENTER                 ARRIVE AT SHORT STAY AT:  5:30 AM               CALL THIS NUMBER IF ANY PROBLEMS THE DAY OF SURGERY :               [phone]                                REMEMBER:   Do not eat food or drink liquids AFTER MIDNIGHT                  Take these medicines the morning of surgery with               A SIPS OF WATER :  BUPROPION / PROZAC / LYRICA / MORPHINE / ZOFRAN / ALBUTEROL / VALIUM IF NEEDED        Do not wear jewelry, make-up   Do not wear lotions, powders, or perfumes.   Do not shave legs or underarms 12 hrs. before surgery (men may shave face)  Do not bring valuables to the hospital.  Contacts, dentures or bridgework may not be worn into surgery.  Leave suitcase in the car. After surgery it may be brought to your room.  For patients admitted to the hospital more than one night, checkout time is            11:00 AM                                                       The day of discharge.   Patients discharged the day of surgery will not be allowed to drive home.            If going home same day of surgery, must have someone stay with you              FIRST 24 hrs at home and arrange for some one to drive you              home from hospital.   ________________________________________________________________________  [place]  Before surgery, you can play an important role.  Because skin is not sterile, your skin needs to be as free of germs as possible.  You can reduce the number of germs on your skin by washing with CHG (chlorahexidine gluconate) soap before surgery.  CHG is an antiseptic  cleaner which kills germs and bonds with the skin to continue killing germs even after washing. Please DO NOT use if you have an allergy to CHG or antibacterial soaps.  If your skin becomes reddened/irritated stop using the CHG and inform your nurse when you arrive at Short Stay. Do not shave (including legs and underarms) for at least 48 hours prior to the first CHG shower.  You may shave your face. Please follow these instructions carefully:   1.  Shower with CHG Soap the night before surgery and the  morning of Surgery.   2.  If you choose to wash your hair, wash your hair first as usual with your  normal  Shampoo.   3.  After you shampoo, rinse your hair and body thoroughly to remove the  shampoo.                                         4.  Use CHG as you would any other liquid soap.  You can apply chg directly  to the skin and wash . Gently wash with scrungie or clean wascloth    5.  Apply the CHG Soap to your body ONLY FROM THE NECK DOWN.   Do not use on open                           Wound or open sores. Avoid contact with eyes, ears mouth and genitals (private parts).                        Genitals (private parts) with your normal soap.              6.  Wash thoroughly, paying special attention to the area where your surgery  will be performed.   7.  Thoroughly rinse your body with warm water from the neck down.   8.  DO NOT shower/wash with your normal soap after using and rinsing off  the CHG Soap .                9.  Pat yourself dry with a clean towel.             10.  Wear clean pajamas.             11.  Place clean sheets on your bed the night of your first shower and do not  sleep with pets.  Day of Surgery : Do not apply any lotions/deodorants the morning of surgery.  Please wear clean clothes to the hospital/surgery center.  FAILURE TO FOLLOW THESE INSTRUCTIONS MAY RESULT IN THE CANCELLATION OF YOUR SURGERY    PATIENT  SIGNATURE_________________________________  ______________________________________________________________________

## 2014-03-08 ENCOUNTER — Encounter (HOSPITAL_COMMUNITY): Payer: Self-pay

## 2014-03-12 NOTE — H&P (Signed)
[name] is an 53 y.o. female.   Chief Complaint: right knee pain HPI: The patient is a 53 year old female who presents with the chief complaint of right knee pain. The patient reports right knee symptoms including: pain and swelling which began  Over three years ago but worsened 4 days ago without any known injury. The patient describes the severity of the symptoms as severe. The patient describes their pain as sharp and weakness.The patient feels that the symptoms are worsening. Symptoms are reported to be located in the right knee and include knee pain, swelling and stiffness. Onset of symptoms was sudden. Symptoms are exacerbated by motion at the knee. Current treatment includes nonsteroidal anti-inflammatory drugs. Symptoms present at the patient's previous evaluation included knee pain, swelling and instability. MRI showed a large extruded medial meniscus tear in the right knee.   Past Medical History  Diagnosis Date  . Depression   . Complication of anesthesia   . PONV (postoperative nausea and vomiting)   . Hx of pneumothorax     X2 IN PAST  . Shortness of breath     unsure of reason - denies asthma/COPD  . Headache     MIGRAINES  . Numbness     MULTIPLE AREAS - "from all the surgeries I've had"  . Arthritis     multiple areas  . Neuropathy   . Fibromyalgia   . Bone marrow disease     DX many years ago - pt unsure of name and unable to find info on type of disease  . Kidney disease     only has one functioning kidney - no problems  x 23 yrs  . Anemia   . H/O iron deficiency anemia   . Anxiety     Past Surgical History  Procedure Laterality Date  . Polyp removal  2010    uterine  . Back surgery      multiple  . Hernia repair  2011  . Foot surgery  1989  . Shoulder surgery      Bilaterally X9 SURGERIES  . Abdominal hysterectomy    . Cervical  spine surg      SEVERAL TIMES  . Cervical discectomy      with fusion    Family History  Problem Relation Age of  Onset  . Heart disease Mother   . Heart disease Father   . Heart disease Maternal Grandfather   . Heart disease Maternal Grandmother   . Heart disease Paternal Grandfather   . Heart disease Paternal Grandmother   . Colon cancer Father    Social History:  reports that she has never smoked. She does not have any smokeless tobacco history on file. She reports that she does not drink alcohol or use illicit drugs.  Allergies:  Allergies  Allergen Reactions  . Fish Allergy Anaphylaxis    To all fish, not just shellfish.  Cannot tolerate even the smell of seafood without her airway swelling/closing.  . Nortriptyline Other (See Comments)    hallucinations  . Bee Venom Hives  . Flexeril [Cyclobenzaprine Hcl] Other (See Comments)    Mood changes   Current outpatient prescriptions: albuterol (VOSPIRE ER) 8 MG 12 hr tablet, Take 2 tablets by mouth 2 (two) times daily. , Disp: , Rfl: ;   buPROPion (WELLBUTRIN XL) 150 MG 24 hr tablet, Take 150 mg by mouth every morning., Disp: , Rfl: ;   diazepam (VALIUM) 5 MG tablet, Take 5 mg by mouth every  8 (eight) hours as needed for anxiety., Disp: , Rfl: ;   FLUoxetine (PROZAC) 40 MG capsule, Take 80 mg by mouth every morning. 2, Disp: , Rfl:  hydrOXYzine (ATARAX/VISTARIL) 50 MG tablet, Take 100 mg by mouth 2 (two) times daily as needed for anxiety., Disp: , Rfl: ;   ibuprofen (ADVIL,MOTRIN) 200 MG tablet, Take 400 mg by mouth daily as needed for headache or mild pain., Disp: , Rfl: ;   morphine (MS CONTIN) 60 MG 12 hr tablet, Take 60 mg by mouth every 8 (eight) hours., Disp: , Rfl:  ondansetron (ZOFRAN-ODT) 8 MG disintegrating tablet, Take 8 mg by mouth every 8 (eight) hours as needed for nausea., Disp: , Rfl: ;   pregabalin (LYRICA) 225 MG capsule, Take 225 mg by mouth 2 (two) times daily., Disp: , Rfl: ;   rOPINIRole (REQUIP) 2 MG tablet, Take 2 mg by mouth at bedtime., Disp: , Rfl:   Review of Systems  Constitutional: Positive for malaise/fatigue.  Negative for fever, chills, weight loss and diaphoresis.  HENT: Positive for hearing loss. Negative for congestion, ear discharge, ear pain, nosebleeds, sore throat and tinnitus.   Eyes: Negative.   Respiratory: Positive for shortness of breath. Negative for cough, hemoptysis, sputum production, wheezing and stridor.   Cardiovascular: Negative.   Gastrointestinal: Negative.   Genitourinary: Negative.   Musculoskeletal: Positive for back pain and joint pain. Negative for falls, myalgias and neck pain.       Right knee pain  Skin: Negative.   Neurological: Positive for dizziness. Negative for tingling, tremors, sensory change, speech change, focal weakness, loss of consciousness, weakness and headaches.  Endo/Heme/Allergies: Positive for polydipsia. Negative for environmental allergies. Does not bruise/bleed easily.  Psychiatric/Behavioral: Negative for depression, suicidal ideas, hallucinations, memory loss and substance abuse. The patient is nervous/anxious. The patient does not have insomnia.    Vitals Weight: 186 lb Height: 65in Body Surface Area: 1.97 m Body Mass Index: 30.95 kg/m  BP: 142/80 (Sitting, Left Arm, Standard) HR: 80 bpm  Physical Exam  Constitutional: She is oriented to person, place, and time. She appears well-developed. No distress.  Overweight  HENT:  Head: Normocephalic and atraumatic.  Right Ear: External ear normal.  Left Ear: External ear normal.  Nose: Nose normal.  Mouth/Throat: Oropharynx is clear and moist.  Eyes: Conjunctivae and EOM are normal.  Neck: Normal range of motion. Neck supple.  Cardiovascular: Normal rate, regular rhythm, normal heart sounds and intact distal pulses.   Respiratory: Effort normal and breath sounds normal. No respiratory distress. She has no wheezes.  GI: Soft. Bowel sounds are normal. She exhibits no distension. There is no tenderness.  Musculoskeletal:       Right hip: Normal.       Left hip: Normal.       Right  knee: She exhibits decreased range of motion, swelling and effusion. She exhibits normal alignment. Tenderness found. Medial joint line and lateral joint line tenderness noted.       Left knee: Normal.       Right lower leg: She exhibits no tenderness and no swelling.       Left lower leg: She exhibits no tenderness and no swelling.  Neurological: She is alert and oriented to person, place, and time. She has normal strength and normal reflexes. No sensory deficit.  Skin: No rash noted. She is not diaphoretic. No erythema.  Psychiatric: She has a normal mood and affect. Her behavior is normal.     Assessment/Plan Right  knee medial meniscus tear She needs a right knee arthroscopy with medial menisectomy. Dr. Darrelyn HillockGioffre discussed the risks and benefits of the procedure with the patient.   H&P performed by Dr. Ranee Gosselinonald Gioffre Documented by Dimitri PedAmber Casmir Auguste, PA-C  Iram Astorino Leotis ShamesLAUREN 03/12/2014, 8:51 AM

## 2014-03-13 ENCOUNTER — Ambulatory Visit (HOSPITAL_COMMUNITY)
Admission: RE | Admit: 2014-03-13 | Discharge: 2014-03-13 | Disposition: A | Discharge status: Home / Self Care | Payer: Medicare Other | Source: Ambulatory Visit | Attending: Orthopedic Surgery | Admitting: Orthopedic Surgery

## 2014-03-13 ENCOUNTER — Encounter (HOSPITAL_COMMUNITY): Payer: Medicare Other | Admitting: Certified Registered Nurse Anesthetist

## 2014-03-13 ENCOUNTER — Encounter (HOSPITAL_COMMUNITY): Payer: Self-pay | Admitting: *Deleted

## 2014-03-13 ENCOUNTER — Encounter (HOSPITAL_COMMUNITY)
Admission: RE | Disposition: A | Discharge status: Home / Self Care | Payer: Self-pay | Source: Ambulatory Visit | Attending: Orthopedic Surgery

## 2014-03-13 ENCOUNTER — Ambulatory Visit (HOSPITAL_COMMUNITY): Payer: Medicare Other | Admitting: Certified Registered Nurse Anesthetist

## 2014-03-13 DIAGNOSIS — Z91013 Allergy to seafood: Secondary | ICD-10-CM | POA: Diagnosis not present

## 2014-03-13 DIAGNOSIS — S83241D Other tear of medial meniscus, current injury, right knee, subsequent encounter: Secondary | ICD-10-CM | POA: Insufficient documentation

## 2014-03-13 DIAGNOSIS — M797 Fibromyalgia: Secondary | ICD-10-CM | POA: Diagnosis not present

## 2014-03-13 DIAGNOSIS — Z79899 Other long term (current) drug therapy: Secondary | ICD-10-CM | POA: Diagnosis not present

## 2014-03-13 DIAGNOSIS — X58XXXD Exposure to other specified factors, subsequent encounter: Secondary | ICD-10-CM | POA: Diagnosis not present

## 2014-03-13 DIAGNOSIS — Z91030 Bee allergy status: Secondary | ICD-10-CM | POA: Diagnosis not present

## 2014-03-13 DIAGNOSIS — F419 Anxiety disorder, unspecified: Secondary | ICD-10-CM | POA: Diagnosis not present

## 2014-03-13 DIAGNOSIS — F329 Major depressive disorder, single episode, unspecified: Secondary | ICD-10-CM | POA: Diagnosis not present

## 2014-03-13 DIAGNOSIS — M179 Osteoarthritis of knee, unspecified: Secondary | ICD-10-CM | POA: Insufficient documentation

## 2014-03-13 DIAGNOSIS — M1711 Unilateral primary osteoarthritis, right knee: Secondary | ICD-10-CM | POA: Diagnosis present

## 2014-03-13 DIAGNOSIS — S83241A Other tear of medial meniscus, current injury, right knee, initial encounter: Secondary | ICD-10-CM | POA: Diagnosis present

## 2014-03-13 DIAGNOSIS — Z888 Allergy status to other drugs, medicaments and biological substances status: Secondary | ICD-10-CM | POA: Diagnosis not present

## 2014-03-13 HISTORY — PX: KNEE ARTHROSCOPY WITH MEDIAL MENISECTOMY: SHX5651

## 2014-03-13 HISTORY — PX: KNEE ARTHROSCOPY WITH MEDIAL PATELLAR FEMORAL LIGAMENT RECONSTRUCTION: SHX5652

## 2014-03-13 HISTORY — PX: KNEE ARTHROSCOPY WITH DRILLING/MICROFRACTURE: SHX6425

## 2014-03-13 HISTORY — PX: SYNOVECTOMY: SHX5180

## 2014-03-13 SURGERY — ARTHROSCOPY, KNEE, WITH MEDIAL MENISCECTOMY
Anesthesia: General | Site: Knee | Laterality: Right

## 2014-03-13 MED ORDER — FENTANYL CITRATE 0.05 MG/ML IJ SOLN
25.0000 ug | INTRAMUSCULAR | Status: DC | PRN
  Administered 2014-03-13 (×3): 50 ug via INTRAVENOUS

## 2014-03-13 MED ORDER — CEFAZOLIN SODIUM-DEXTROSE 2-3 GM-% IV SOLR
2.0000 g | INTRAVENOUS | Status: AC
  Administered 2014-03-13: 2 g via INTRAVENOUS

## 2014-03-13 MED ORDER — SODIUM CHLORIDE 0.9 % IJ SOLN
INTRAMUSCULAR | Status: AC
  Filled 2014-03-13: qty 10

## 2014-03-13 MED ORDER — PROPOFOL 10 MG/ML IV BOLUS
INTRAVENOUS | Status: AC
Start: 2014-03-13 — End: 2014-03-13
  Filled 2014-03-13: qty 20

## 2014-03-13 MED ORDER — PROPOFOL 10 MG/ML IV BOLUS
INTRAVENOUS | Status: DC | PRN
  Administered 2014-03-13: 200 mg via INTRAVENOUS

## 2014-03-13 MED ORDER — BUPIVACAINE-EPINEPHRINE 0.25% -1:200000 IJ SOLN
INTRAMUSCULAR | Status: DC | PRN
  Administered 2014-03-13: 30 mL

## 2014-03-13 MED ORDER — CEFAZOLIN SODIUM-DEXTROSE 2-3 GM-% IV SOLR
INTRAVENOUS | Status: AC
  Filled 2014-03-13: qty 50

## 2014-03-13 MED ORDER — FENTANYL CITRATE 0.05 MG/ML IJ SOLN
INTRAMUSCULAR | Status: DC | PRN
  Administered 2014-03-13 (×3): 50 ug via INTRAVENOUS
  Administered 2014-03-13: 25 ug via INTRAVENOUS
  Administered 2014-03-13 (×3): 50 ug via INTRAVENOUS
  Administered 2014-03-13: 25 ug via INTRAVENOUS

## 2014-03-13 MED ORDER — FENTANYL CITRATE 0.05 MG/ML IJ SOLN
INTRAMUSCULAR | Status: AC
  Filled 2014-03-13: qty 5

## 2014-03-13 MED ORDER — LIDOCAINE HCL (CARDIAC) 20 MG/ML IV SOLN
INTRAVENOUS | Status: AC
  Filled 2014-03-13: qty 5

## 2014-03-13 MED ORDER — FENTANYL CITRATE 0.05 MG/ML IJ SOLN
INTRAMUSCULAR | Status: AC
  Filled 2014-03-13: qty 2

## 2014-03-13 MED ORDER — METHOCARBAMOL 1000 MG/10ML IJ SOLN
500.0000 mg | Freq: Once | INTRAVENOUS | Status: AC
  Administered 2014-03-13: 500 mg via INTRAVENOUS
  Filled 2014-03-13: qty 5

## 2014-03-13 MED ORDER — DEXAMETHASONE SODIUM PHOSPHATE 10 MG/ML IJ SOLN
INTRAMUSCULAR | Status: AC
  Filled 2014-03-13: qty 1

## 2014-03-13 MED ORDER — BACITRACIN ZINC 500 UNIT/GM EX OINT
TOPICAL_OINTMENT | CUTANEOUS | Status: DC | PRN
  Administered 2014-03-13: 1 via TOPICAL

## 2014-03-13 MED ORDER — OXYCODONE-ACETAMINOPHEN 10-325 MG PO TABS: 1.0000 | ORAL_TABLET | ORAL | Status: DC | PRN

## 2014-03-13 MED ORDER — MIDAZOLAM HCL 2 MG/2ML IJ SOLN
INTRAMUSCULAR | Status: AC
  Filled 2014-03-13: qty 2

## 2014-03-13 MED ORDER — LACTATED RINGERS IV SOLN
INTRAVENOUS | Status: DC
  Administered 2014-03-13 (×2): via INTRAVENOUS

## 2014-03-13 MED ORDER — ONDANSETRON HCL 4 MG/2ML IJ SOLN
INTRAMUSCULAR | Status: DC | PRN
  Administered 2014-03-13: 4 mg via INTRAVENOUS

## 2014-03-13 MED ORDER — LIDOCAINE HCL (CARDIAC) 20 MG/ML IV SOLN
INTRAVENOUS | Status: DC | PRN
  Administered 2014-03-13: 100 mg via INTRAVENOUS

## 2014-03-13 MED ORDER — PROMETHAZINE HCL 25 MG/ML IJ SOLN
INTRAMUSCULAR | Status: AC
  Filled 2014-03-13: qty 1

## 2014-03-13 MED ORDER — PROMETHAZINE HCL 25 MG/ML IJ SOLN
6.2500 mg | INTRAMUSCULAR | Status: AC | PRN
  Administered 2014-03-13 (×2): 6.25 mg via INTRAVENOUS

## 2014-03-13 MED ORDER — ACETAMINOPHEN 10 MG/ML IV SOLN
1000.0000 mg | Freq: Once | INTRAVENOUS | Status: AC
  Administered 2014-03-13: 1000 mg via INTRAVENOUS
  Filled 2014-03-13 (×2): qty 100

## 2014-03-13 MED ORDER — PHENYLEPHRINE 40 MCG/ML (10ML) SYRINGE FOR IV PUSH (FOR BLOOD PRESSURE SUPPORT)
PREFILLED_SYRINGE | INTRAVENOUS | Status: AC
  Filled 2014-03-13: qty 10

## 2014-03-13 MED ORDER — BUPIVACAINE-EPINEPHRINE (PF) 0.25% -1:200000 IJ SOLN
INTRAMUSCULAR | Status: AC
  Filled 2014-03-13: qty 30

## 2014-03-13 MED ORDER — METOCLOPRAMIDE HCL 5 MG/ML IJ SOLN
INTRAMUSCULAR | Status: AC
  Filled 2014-03-13: qty 2

## 2014-03-13 MED ORDER — MIDAZOLAM HCL 5 MG/5ML IJ SOLN
INTRAMUSCULAR | Status: DC | PRN
  Administered 2014-03-13: 2 mg via INTRAVENOUS

## 2014-03-13 MED ORDER — METOCLOPRAMIDE HCL 5 MG/ML IJ SOLN
INTRAMUSCULAR | Status: DC | PRN
  Administered 2014-03-13: 10 mg via INTRAVENOUS

## 2014-03-13 MED ORDER — EPHEDRINE SULFATE 50 MG/ML IJ SOLN
INTRAMUSCULAR | Status: AC
  Filled 2014-03-13: qty 1

## 2014-03-13 MED ORDER — KETOROLAC TROMETHAMINE 30 MG/ML IJ SOLN
15.0000 mg | Freq: Once | INTRAMUSCULAR | Status: AC | PRN
  Administered 2014-03-13: 30 mg via INTRAVENOUS

## 2014-03-13 MED ORDER — PHENYLEPHRINE HCL 10 MG/ML IJ SOLN
INTRAMUSCULAR | Status: DC | PRN
  Administered 2014-03-13: 80 ug via INTRAVENOUS
  Administered 2014-03-13 (×2): 40 ug via INTRAVENOUS

## 2014-03-13 MED ORDER — DEXAMETHASONE SODIUM PHOSPHATE 10 MG/ML IJ SOLN
INTRAMUSCULAR | Status: DC | PRN
  Administered 2014-03-13: 10 mg via INTRAVENOUS

## 2014-03-13 MED ORDER — BACITRACIN ZINC 500 UNIT/GM EX OINT
TOPICAL_OINTMENT | CUTANEOUS | Status: AC
  Filled 2014-03-13: qty 28.35

## 2014-03-13 MED ORDER — KETOROLAC TROMETHAMINE 30 MG/ML IJ SOLN
INTRAMUSCULAR | Status: AC
  Filled 2014-03-13: qty 1

## 2014-03-13 MED ORDER — ONDANSETRON HCL 4 MG/2ML IJ SOLN
INTRAMUSCULAR | Status: AC
  Filled 2014-03-13: qty 2

## 2014-03-13 MED ORDER — LACTATED RINGERS IR SOLN
Status: DC | PRN
  Administered 2014-03-13: 9000 mL

## 2014-03-13 MED ORDER — ATROPINE SULFATE 0.4 MG/ML IJ SOLN
INTRAMUSCULAR | Status: AC
  Filled 2014-03-13: qty 1

## 2014-03-13 SURGICAL SUPPLY — 31 items
BANDAGE ELASTIC 4 VELCRO ST LF (GAUZE/BANDAGES/DRESSINGS) ×3 IMPLANT
BANDAGE ELASTIC 6 VELCRO ST LF (GAUZE/BANDAGES/DRESSINGS) IMPLANT
BLADE GREAT WHITE 4.2 (BLADE) ×1 IMPLANT
BLADE GREAT WHITE 4.2MM (BLADE) ×1
BLADE SURG SZ11 CARB STEEL (BLADE) IMPLANT
BNDG COHESIVE 6X5 TAN STRL LF (GAUZE/BANDAGES/DRESSINGS) ×3 IMPLANT
CLOTH BEACON ORANGE TIMEOUT ST (SAFETY) ×3 IMPLANT
COUNTER NEEDLE 20 DBL MAG RED (NEEDLE) ×3 IMPLANT
DRAPE SHEET LG 3/4 BI-LAMINATE (DRAPES) ×3 IMPLANT
DRSG ADAPTIC 3X8 NADH LF (GAUZE/BANDAGES/DRESSINGS) ×3 IMPLANT
DRSG PAD ABDOMINAL 8X10 ST (GAUZE/BANDAGES/DRESSINGS) ×6 IMPLANT
DURAPREP 26ML APPLICATOR (WOUND CARE) ×3 IMPLANT
GLOVE BIOGEL PI IND STRL 8 (GLOVE) ×1 IMPLANT
GLOVE BIOGEL PI INDICATOR 8 (GLOVE) ×2
GLOVE ECLIPSE 8.0 STRL XLNG CF (GLOVE) ×3 IMPLANT
GOWN STRL REUS W/TWL XL LVL3 (GOWN DISPOSABLE) ×3 IMPLANT
KIT BASIN OR (CUSTOM PROCEDURE TRAY) IMPLANT
MANIFOLD NEPTUNE II (INSTRUMENTS) ×3 IMPLANT
NDL SPNL 18GX3.5 QUINCKE PK (NEEDLE) IMPLANT
NEEDLE SPNL 18GX3.5 QUINCKE PK (NEEDLE) ×3 IMPLANT
PACK ARTHROSCOPY WL (CUSTOM PROCEDURE TRAY) ×3 IMPLANT
PACK ICE MAXI GEL EZY WRAP (MISCELLANEOUS) ×3 IMPLANT
PAD MASON LEG HOLDER (PIN) ×3 IMPLANT
SET ARTHROSCOPY TUBING (MISCELLANEOUS) ×3
SET ARTHROSCOPY TUBING LN (MISCELLANEOUS) ×1 IMPLANT
SUT ETHILON 3 0 PS 1 (SUTURE) ×3 IMPLANT
TOWEL OR 17X26 10 PK STRL BLUE (TOWEL DISPOSABLE) ×3 IMPLANT
TUBING CONNECTING 10 (TUBING) IMPLANT
TUBING CONNECTING 10' (TUBING)
WAND 90 DEG TURBOVAC W/CORD (SURGICAL WAND) ×3 IMPLANT
WRAP KNEE MAXI GEL POST OP (GAUZE/BANDAGES/DRESSINGS) ×3 IMPLANT

## 2014-03-13 NOTE — Interval H&P Note (Signed)
History and Physical Interval Note:  03/13/2014 7:20 AM  Tara FullingPatricia R Burch  has presented today for surgery, with the diagnosis of right knee medial mensical tear  The various methods of treatment have been discussed with the patient and family. After consideration of risks, benefits and other options for treatment, the patient has consented to  Procedure(s): RIGHT KNEE ARTHROSCOPY WITH MEDIAL MENISECTOMY (Right) as a surgical intervention .  The patient's history has been reviewed, patient examined, no change in status, stable for surgery.  I have reviewed the patient's chart and labs.  Questions were answered to the patient's satisfaction.     Janaysha Depaulo A

## 2014-03-13 NOTE — Anesthesia Preprocedure Evaluation (Signed)
Anesthesia Evaluation  Patient identified by MRN, date of birth, ID band Patient awake    Reviewed: Allergy & Precautions, H&P , NPO status , Patient's Chart, lab work & pertinent test results  Airway Mallampati: II  TM Distance: >3 FB Neck ROM: Full    Dental no notable dental hx.    Pulmonary neg pulmonary ROS,  breath sounds clear to auscultation  Pulmonary exam normal       Cardiovascular negative cardio ROS  Rhythm:Regular Rate:Normal     Neuro/Psych negative neurological ROS  negative psych ROS   GI/Hepatic negative GI ROS, Neg liver ROS,   Endo/Other  negative endocrine ROS  Renal/GU negative Renal ROS  negative genitourinary   Musculoskeletal  (+) Fibromyalgia -  Abdominal   Peds negative pediatric ROS (+)  Hematology negative hematology ROS (+)   Anesthesia Other Findings   Reproductive/Obstetrics negative OB ROS                             Anesthesia Physical Anesthesia Plan  ASA: II  Anesthesia Plan: General   Post-op Pain Management:    Induction: Intravenous  Airway Management Planned: LMA  Additional Equipment:   Intra-op Plan:   Post-operative Plan:   Informed Consent: I have reviewed the patients History and Physical, chart, labs and discussed the procedure including the risks, benefits and alternatives for the proposed anesthesia with the patient or authorized representative who has indicated his/her understanding and acceptance.   Dental advisory given  Plan Discussed with: CRNA and Surgeon  Anesthesia Plan Comments:         Anesthesia Quick Evaluation

## 2014-03-13 NOTE — Op Note (Signed)
[name]:  Burch, Tara              ACCOUNT NO.:  [id number]  MEDICAL RECORD NO.:  [id number]  LOCATION:  [facility]                         [facility]  PHYSICIAN:  Georges Lynchonald A. Michole Lecuyer, M.D.DATE OF BIRTH:  1961/05/01  DATE OF PROCEDURE:  03/13/2014 DATE OF DISCHARGE:                              OPERATIVE REPORT   SURGEON:  Georges Lynchonald A. Darrelyn HillockGioffre, M.D.  ASSISTANT:  The OR nurse.  PREOPERATIVE DIAGNOSES: 1. Osteoarthritis, right knee. 2. Torn medial meniscus, right knee.  POSTOPERATIVE DIAGNOSES: 1. Osteoarthritis, right knee. 2. Torn medial meniscus, right knee. 3. Severe pigmented villonodular synovitis.  OPERATIONS: 1. Diagnostic arthroscopy of the right knee. 2. Extensive synovectomy of the suprapatellar pouch, right knee. 3. Medial meniscectomy, right knee. 4. Abrasion chondroplasty, medial femoral condyle, right knee. 5. Microfracture technique at the medial femoral condyle, right knee. 6. Synovectomy in the medial joint.  DESCRIPTION OF PROCEDURE:  Under general anesthesia, routine orthopedic prep and draping of the right lower extremity was carried out.  Then, the right leg was in a knee holder.  The appropriate time-out was first carried out.  I also marked the appropriate right leg in the holding area.  She had 2 g of IV Ancef preop.  At this time, a small punctate incision was made in the suprapatellar pouch.  Inflow cannula was inserted.  Knee was distended with saline.  Another small punctate incision was made in the anterolateral joint.  The arthroscope was entered from lateral approach and a complete diagnostic arthroscopy was carried out.  At that time, I then went up in the suprapatellar pouch and noted she had a severe chronic pigmented villonodular synovitis type picture involving the suprapatellar synovium.  I did an extensive synovectomy.  She had some early chondromalacia of the patella.  I then went down to the lateral joint.  She had some synovitis there as  well. Cruciates were intact.  The medial joint was the main issue.  She had obvious osteoarthritic changes.  This also was blood tinged and that is why it made me very suspicious that we are dealing with early pigmented villonodular synovitis.  She had rather severe synovitis in the medial joint.  I did a synovectomy first.  I then did an abrasion chondroplasty of the medial femoral condyle.  I then went on and did a partial medial meniscectomy.  After this, I then did a microfracture technique of the medial femoral condyle.  I thoroughly irrigated out the area, removed the fluid, closed all 3 punctate incisions with 3-0 nylon suture.  I injected 30 mL of 0.25% Marcaine with epinephrine.  Postop, she will be on aspirin.  Then, I applied a sterile dressing.  Postop, she will be on aspirin 325 mg b.i.d. for 2 weeks starting today as an anticoagulant.  She will be on crutches, partial weightbearing as tolerated.  She will be seen in office 10 to 12 days or prior to if there is a problem.  Percocet 10/325 one every 3 hours p.r.n. for pain.          ______________________________ Georges Lynchonald A. Darrelyn HillockGioffre, M.D.     RAG/MEDQ  D:  03/13/2014  T:  03/13/2014  Job:  [id number]

## 2014-03-13 NOTE — Brief Op Note (Signed)
03/13/2014  8:53 AM  PATIENT:  [name]  [age] y.o. female  PRE-OPERATIVE DIAGNOSIS:  right knee medial mensical tear and Osteoarthritis  POST-OPERATIVE DIAGNOSIS:  right knee medial mensical tear and Osteoarthritis  PROCEDURE:  Diagnotic Arthroscopy,Medial Meniscectomy,Abraison Chondroplasty,Microfracture and Microfractyre of Medial Femoral Condyle and Synovectomy of Suprapatellar Pouch all of Right Knee.  SURGEON:  Surgeon(s) and Role:    * Jacki Conesonald A Hiawatha Dressel, MD - Primary    ASSISTANTS:OR Tech  ANESTHESIA:   general  EBL:  Total I/O In: 1100 [I.V.:1100] Out: -   BLOOD ADMINISTERED:none  DRAINS: none   LOCAL MEDICATIONS USED:  MARCAINE 30cc of 0.25% with Epinephrine    SPECIMEN:  No Specimen  DISPOSITION OF SPECIMEN:  N/A  COUNTS:  YES  TOURNIQUET:  * No tourniquets in log *  DICTATION: .Other Dictation: Dictation Number [id number]  PLAN OF CARE: Discharge to home after PACU  PATIENT DISPOSITION:  PACU - hemodynamically stable.   Delay start of Pharmacological VTE agent (>24hrs) due to surgical blood loss or risk of bleeding: yes

## 2014-03-13 NOTE — Transfer of Care (Signed)
Immediate Anesthesia Transfer of Care Note  Patient: [name]  Procedure(s) Performed: Procedure(s) (LRB): RIGHT KNEE ARTHROSCOPY WITH MEDIAL FEMORAL CONDYLE REPAIR (Right) RIGHT MICROFRACTURE TECHNIQUE (Right) ABRASION CHONDROPLASTY OF MEDIAL FEMORAL (Right) SYNOVECTOMY OF THE SUPRAPATELLA POUCH (Right)  Patient Location: PACU  Anesthesia Type: General  Level of Consciousness: sedated, patient cooperative and responds to stimulation  Airway & Oxygen Therapy: Patient Spontanous Breathing and Patient connected to face mask oxgen  Post-op Assessment: Report given to PACU RN and Post -op Vital signs reviewed and stable  Post vital signs: Reviewed and stable  Complications: No apparent anesthesia complications

## 2014-03-13 NOTE — Progress Notes (Signed)
Ortho tech in to see patient and give her crutches and instructions. Knows she can bear full weight

## 2014-03-14 ENCOUNTER — Encounter (HOSPITAL_COMMUNITY): Payer: Self-pay | Admitting: Orthopedic Surgery

## 2014-03-25 NOTE — Anesthesia Postprocedure Evaluation (Signed)
  Anesthesia Post-op Note  Patient: [name]  Procedure(s) Performed: Procedure(s) (LRB): RIGHT KNEE ARTHROSCOPY WITH MEDIAL FEMORAL CONDYLE REPAIR (Right) RIGHT MICROFRACTURE TECHNIQUE (Right) ABRASION CHONDROPLASTY OF MEDIAL FEMORAL (Right) SYNOVECTOMY OF THE SUPRAPATELLA POUCH (Right)  Patient Location: PACU  Anesthesia Type: General  Level of Consciousness: awake and alert   Airway and Oxygen Therapy: Patient Spontanous Breathing  Post-op Pain: mild  Post-op Assessment: Post-op Vital signs reviewed, Patient's Cardiovascular Status Stable, Respiratory Function Stable, Patent Airway and No signs of Nausea or vomiting  Last Vitals:  Filed Vitals:   03/13/14 1045  BP: 137/65  Pulse: 84  Temp:   Resp: 15    Post-op Vital Signs: stable   Complications: No apparent anesthesia complications

## 2014-04-29 ENCOUNTER — Other Ambulatory Visit (HOSPITAL_COMMUNITY): Payer: Self-pay | Admitting: Family Medicine

## 2014-04-29 DIAGNOSIS — Z1231 Encounter for screening mammogram for malignant neoplasm of breast: Secondary | ICD-10-CM

## 2014-05-03 ENCOUNTER — Ambulatory Visit (HOSPITAL_COMMUNITY)
Admission: RE | Admit: 2014-05-03 | Discharge: 2014-05-03 | Disposition: A | Discharge status: Home / Self Care | Payer: Medicare Other | Source: Ambulatory Visit | Attending: Family Medicine | Admitting: Family Medicine

## 2014-05-03 DIAGNOSIS — Z1231 Encounter for screening mammogram for malignant neoplasm of breast: Secondary | ICD-10-CM | POA: Insufficient documentation

## 2014-07-18 NOTE — Progress Notes (Signed)
Please put orders in Epic surgery 07-30-14 pre op 07-25-14 Thanks 

## 2014-07-23 NOTE — H&P (Signed)
TOTAL KNEE ADMISSION H&P  Patient is being admitted for right total knee arthroplasty.  Subjective:  Chief Complaint:right knee pain.  HPI: [name], 54 y.o. female, has a history of pain and functional disability in the right knee due to arthritis and has failed non-surgical conservative treatments for greater than 12 weeks to includeNSAID's and/or analgesics, corticosteriod injections, use of assistive devices and activity modification.  Onset of symptoms was gradual, starting 8 years ago with gradually worsening course since that time. The patient noted prior procedures on the knee to include  arthroscopy and menisectomy on the right knee(s).  Patient currently rates pain in the right knee(s) at 8 out of 10 with activity. Patient has night pain, worsening of pain with activity and weight bearing, pain that interferes with activities of daily living, pain with passive range of motion, crepitus and joint swelling.  Patient has evidence of periarticular osteophytes and joint space narrowing by imaging studies. There is no active infection.  Patient Active Problem List   Diagnosis Date Noted  . Osteoarthritis of right knee 03/13/2014  . Tear of medial meniscus of right knee, current 03/13/2014  . Kidney disease 08/13/2010  . Anemia 08/13/2010  . SOB (shortness of breath) 08/13/2010  . Abnormal uterine bleeding 08/13/2010  . Morbid obesity 08/13/2010  . Depression    Past Medical History  Diagnosis Date  . Depression   . Complication of anesthesia   . PONV (postoperative nausea and vomiting)   . Hx of pneumothorax     X2 IN PAST  . Shortness of breath     unsure of reason - denies asthma/COPD  . Headache     MIGRAINES  . Numbness     MULTIPLE AREAS - "from all the surgeries I've had"  . Arthritis     multiple areas  . Neuropathy   . Fibromyalgia   . Bone marrow disease     DX many years ago - pt unsure of name and unable to find info on type of disease  . Kidney disease      only has one functioning kidney - no problems  x 23 yrs  . Anemia   . H/O iron deficiency anemia   . Anxiety     Past Surgical History  Procedure Laterality Date  . Polyp removal  2010    uterine  . Back surgery      multiple  . Hernia repair  2011  . Foot surgery  1989  . Shoulder surgery      Bilaterally X9 SURGERIES  . Abdominal hysterectomy    . Cervical  spine surg      SEVERAL TIMES  . Cervical discectomy      with fusion  . Knee arthroscopy with medial menisectomy Right 03/13/2014    Procedure: RIGHT KNEE ARTHROSCOPY WITH MEDIAL FEMORAL CONDYLE REPAIR;  Surgeon: Jacki Cones, MD;  Location: [facility];  Service: Orthopedics;  Laterality: Right;  . Knee arthroscopy with drilling/microfracture Right 03/13/2014    Procedure: RIGHT MICROFRACTURE TECHNIQUE;  Surgeon: Jacki Cones, MD;  Location: [facility];  Service: Orthopedics;  Laterality: Right;  . Knee arthroscopy with medial patellar femoral ligament reconstruction Right 03/13/2014    Procedure: ABRASION CHONDROPLASTY OF MEDIAL FEMORAL;  Surgeon: Jacki Cones, MD;  Location: [facility];  Service: Orthopedics;  Laterality: Right;  . Synovectomy Right 03/13/2014    Procedure: SYNOVECTOMY OF THE SUPRAPATELLA POUCH;  Surgeon: Jacki Cones, MD;  Location: [facility];  Service: Orthopedics;  Laterality: Right;     Current outpatient prescriptions:  .  albuterol (VOSPIRE ER) 8 MG 12 hr tablet, Take 2 tablets by mouth 2 (two) times daily. , Disp: , Rfl:  .  buPROPion (WELLBUTRIN XL) 150 MG 24 hr tablet, Take 150 mg by mouth every morning., Disp: , Rfl:  .  diazepam (VALIUM) 5 MG tablet, Take 5 mg by mouth every 8 (eight) hours as needed for anxiety., Disp: , Rfl:  .  FLUoxetine (PROZAC) 40 MG capsule, Take 80 mg by mouth every morning. 2, Disp: , Rfl:  .  hydrOXYzine (ATARAX/VISTARIL) 50 MG tablet, Take 100 mg by mouth 2 (two) times daily as needed for anxiety., Disp: , Rfl:  .  ondansetron (ZOFRAN-ODT) 8 MG disintegrating  tablet, Take 8 mg by mouth every 8 (eight) hours as needed for nausea., Disp: , Rfl:  .  Oxycodone HCl 20 MG TABS, Take 40 mg by mouth every 6 (six) hours as needed (pain.)., Disp: , Rfl:  .  pregabalin (LYRICA) 225 MG capsule, Take 225 mg by mouth 2 (two) times daily., Disp: , Rfl:  .  PRESCRIPTION MEDICATION, Place 2 drops into both eyes every 4 (four) hours as needed (pink eye.)., Disp: , Rfl:  .  rOPINIRole (REQUIP) 2 MG tablet, Take 1 mg by mouth at bedtime. , Disp: , Rfl:    Allergies  Allergen Reactions  . Fish Allergy Anaphylaxis    To all fish, not just shellfish.  Cannot tolerate even the smell of seafood without her airway swelling/closing. Reports that she has no issues with betadine  . Nortriptyline Other (See Comments)    hallucinations  . Bee Venom Hives  . Flexeril [Cyclobenzaprine Hcl] Other (See Comments)    Mood changes    History  Substance Use Topics  . Smoking status: Never Smoker   . Smokeless tobacco: No  . Alcohol Use: No    Family History  Problem Relation Age of Onset  . Heart disease Mother   . Heart disease Father   . Heart disease Maternal Grandfather   . Heart disease Maternal Grandmother   . Heart disease Paternal Grandfather   . Heart disease Paternal Grandmother   . Colon cancer Father      Review of Systems  Constitutional: Negative.   HENT: Negative for congestion, ear discharge, ear pain, hearing loss, nosebleeds, sore throat and tinnitus.   Eyes: Negative.   Respiratory: Positive for shortness of breath. Negative for cough, hemoptysis, sputum production, wheezing and stridor.        SOB with exertion  Cardiovascular: Negative.   Gastrointestinal: Negative.   Genitourinary: Negative.   Musculoskeletal: Positive for myalgias, back pain, joint pain, falls and neck pain.       Right knee pain  Skin: Negative.   Neurological: Positive for headaches. Negative for dizziness, tingling, tremors, sensory change, speech change, focal weakness,  seizures and loss of consciousness.  Endo/Heme/Allergies: Negative.   Psychiatric/Behavioral: Positive for depression. Negative for suicidal ideas, hallucinations, memory loss and substance abuse. The patient is nervous/anxious. The patient does not have insomnia.     Objective:  Physical Exam  Constitutional: She is oriented to person, place, and time. She appears well-developed. No distress.  Morbid obesity  HENT:  Head: Normocephalic and atraumatic.  Right Ear: External ear normal.  Left Ear: External ear normal.  Nose: Nose normal.  Mouth/Throat: Oropharynx is clear and moist.  Eyes: Conjunctivae and EOM are normal.  Neck: Normal range of motion. Neck supple.  Cardiovascular: Normal rate, regular rhythm, normal heart sounds and intact distal pulses.   No murmur heard. Respiratory: Effort normal and breath sounds normal. No respiratory distress. She has no wheezes.  GI: Soft. Bowel sounds are normal. She exhibits no distension. There is no tenderness.  Musculoskeletal:       Right hip: Normal.       Left hip: Normal.       Right knee: She exhibits decreased range of motion. She exhibits no effusion and no erythema. Tenderness found. Medial joint line and lateral joint line tenderness noted.       Left knee: Normal.       Right lower leg: She exhibits no tenderness and no swelling.       Left lower leg: She exhibits no tenderness and no swelling.  Neurological: She is alert and oriented to person, place, and time. She has normal reflexes.  Skin: No rash noted. She is not diaphoretic. No erythema.  Psychiatric: Her speech is normal and behavior is normal. Her mood appears anxious.    Vitals  Weight: 210 lb Height: 65in Body Surface Area: 2.02 m Body Mass Index: 34.95 kg/m  Pulse: 72 (Regular)  BP: 122/74 (Sitting, Left Arm, Standard)  Imaging Review Plain radiographs demonstrate severe degenerative joint disease of the right knee(s). The overall alignment ismild  varus. The bone quality appears to be good for age and reported activity level.  Assessment/Plan:  End stage arthritis, right knee   The patient history, physical examination, clinical judgment of the provider and imaging studies are consistent with end stage degenerative joint disease of the right knee(s) and total knee arthroplasty is deemed medically necessary. The treatment options including medical management, injection therapy arthroscopy and arthroplasty were discussed at length. The risks and benefits of total knee arthroplasty were presented and reviewed. The risks due to aseptic loosening, infection, stiffness, patella tracking problems, thromboembolic complications and other imponderables were discussed. The patient acknowledged the explanation, agreed to proceed with the plan and consent was signed. Patient is being admitted for inpatient treatment for surgery, pain control, PT, OT, prophylactic antibiotics, VTE prophylaxis, progressive ambulation and ADL's and discharge planning. The patient is planning to be discharged home with home health services   TXA IV PCP: Dr. Windle GuardWilson Elkins   Dimitri PedAmber Ovella Manygoats, PA-C

## 2014-07-24 ENCOUNTER — Other Ambulatory Visit (HOSPITAL_COMMUNITY): Payer: Medicare Other

## 2014-07-24 ENCOUNTER — Other Ambulatory Visit (HOSPITAL_COMMUNITY): Payer: Self-pay | Admitting: *Deleted

## 2014-07-25 ENCOUNTER — Encounter (HOSPITAL_COMMUNITY): Payer: Self-pay

## 2014-07-25 ENCOUNTER — Encounter (HOSPITAL_COMMUNITY)
Admission: RE | Admit: 2014-07-25 | Discharge: 2014-07-25 | Disposition: A | Discharge status: Home / Self Care | Payer: Medicare Other | Source: Ambulatory Visit | Attending: Orthopedic Surgery | Admitting: Orthopedic Surgery

## 2014-07-25 DIAGNOSIS — M179 Osteoarthritis of knee, unspecified: Secondary | ICD-10-CM | POA: Insufficient documentation

## 2014-07-25 DIAGNOSIS — Z7901 Long term (current) use of anticoagulants: Secondary | ICD-10-CM | POA: Diagnosis not present

## 2014-07-25 DIAGNOSIS — Z01818 Encounter for other preprocedural examination: Secondary | ICD-10-CM | POA: Insufficient documentation

## 2014-07-25 HISTORY — DX: Acute and subacute hepatic failure without coma: K72.00

## 2014-07-25 LAB — PROTIME-INR
INR: 1.14 (ref 0.00–1.49)
Prothrombin Time: 14.7 seconds (ref 11.6–15.2)

## 2014-07-25 LAB — COMPREHENSIVE METABOLIC PANEL
ALK PHOS: 126 U/L — AB (ref 39–117)
ALT: 11 U/L (ref 0–35)
AST: 16 U/L (ref 0–37)
Albumin: 4.3 g/dL (ref 3.5–5.2)
Anion gap: 5 (ref 5–15)
BILIRUBIN TOTAL: 0.7 mg/dL (ref 0.3–1.2)
BUN: 13 mg/dL (ref 6–23)
CHLORIDE: 107 mmol/L (ref 96–112)
CO2: 28 mmol/L (ref 19–32)
Calcium: 9.1 mg/dL (ref 8.4–10.5)
Creatinine, Ser: 0.75 mg/dL (ref 0.50–1.10)
GFR calc Af Amer: >90 mL/min (ref >90)
GFR calc non Af Amer: >90 mL/min (ref >90)
Glucose, Bld: 89 mg/dL (ref 70–99)
Potassium: 3.8 mmol/L (ref 3.5–5.1)
SODIUM: 140 mmol/L (ref 135–145)
Total Protein: 7.9 g/dL (ref 6.0–8.3)

## 2014-07-25 LAB — CBC
HEMATOCRIT: 40.4 % (ref 36.0–46.0)
HEMOGLOBIN: 12.6 g/dL (ref 12.0–15.0)
MCH: 26.9 pg (ref 26.0–34.0)
MCHC: 31.2 g/dL (ref 30.0–36.0)
MCV: 86.1 fL (ref 78.0–100.0)
PLATELETS: 349 10*3/uL (ref 150–400)
RBC: 4.69 MIL/uL (ref 3.87–5.11)
RDW: 13.3 % (ref 11.5–15.5)
WBC: 8.5 10*3/uL (ref 4.0–10.5)

## 2014-07-25 LAB — APTT: aPTT: 35 seconds (ref 24–37)

## 2014-07-25 NOTE — Progress Notes (Signed)
Medical clearance note dr Jeannetta Napelkins on chart for 07-30-14 surgery Chesty xray 03-07-14 epic

## 2014-07-25 NOTE — Patient Instructions (Addendum)
[name]  07/25/2014   Your procedure is scheduled on: Tuesday March 15th, 1015  [facility] and follow signs to               Short Stay Center at 1015 AM.  Call this number if you have problems the morning of surgery [phone]   Remember:  Do not eat food or drink liquids :After Midnight.     Take these medicines the morning of surgery with A SIP OF WATER: Albuterol, Oxycodone, Bupropion, Diazepam if nneded, Fluoxetine, Lyrica                               You may not have any metal on your body including hair pins and              piercings  Do not wear jewelry, make-up, lotions, powders or perfumes.             Do not wear nail polish.  Do not shave  48 hours prior to surgery.              Men may shave face and neck.   Do not bring valuables to the hospital. [facility] IS NOT             RESPONSIBLE   FOR VALUABLES.  Contacts, dentures or bridgework may not be worn into surgery.  Leave suitcase in the car. After surgery it may be brought to your room.     Patients discharged the day of surgery will not be allowed to drive home.  Name and phone number of your driver:  Special Instructions: N/A              Please read over the following fact sheets you were given: _____________________________________________________________________             [facility] - Preparing for [facility] Before surgery, you can play an important role.  Because skin is not sterile, your skin needs to be as free of germs as possible.  You can reduce the number of germs on your skin by washing with CHG (chlorahexidine gluconate) soap before surgery.  CHG is an antiseptic cleaner which kills germs and bonds with the skin to continue killing germs even after washing. Please DO NOT use if you have an allergy to CHG or antibacterial soaps.  If your skin becomes reddened/irritated stop using the CHG and inform your nurse when you arrive at  Short Stay. Do not shave (including legs and underarms) for at least 48 hours prior to the first CHG shower.  You may shave your face/neck. Please follow these instructions carefully:  1.  Shower with CHG Soap the night before surgery and the  morning of Surgery.  2.  If you choose to wash your hair, wash your hair first as usual with your  normal  shampoo.  3.  After you shampoo, rinse your hair and body thoroughly to remove the  shampoo.                           4.  Use CHG as you would any other liquid soap.  You can apply chg directly  to the skin and wash  Gently with a scrungie or clean washcloth.  5.  Apply the CHG Soap to your body ONLY FROM THE NECK DOWN.   Do not use on face/ open                           Wound or open sores. Avoid contact with eyes, ears mouth and genitals (private parts).                       Wash face,  Genitals (private parts) with your normal soap.             6.  Wash thoroughly, paying special attention to the area where your surgery  will be performed.  7.  Thoroughly rinse your body with warm water from the neck down.  8.  DO NOT shower/wash with your normal soap after using and rinsing off  the CHG Soap.                9.  Pat yourself dry with a clean towel.            10.  Wear clean pajamas.            11.  Place clean sheets on your bed the night of your first shower and do not  sleep with pets. Day of Surgery : Do not apply any lotions/deodorants the morning of surgery.  Please wear clean clothes to the hospital/surgery center.  FAILURE TO FOLLOW THESE INSTRUCTIONS MAY RESULT IN THE CANCELLATION OF YOUR SURGERY PATIENT SIGNATURE_________________________________  NURSE SIGNATURE__________________________________  ________________________________________________________________________

## 2014-07-26 LAB — SURGICAL PCR SCREEN
MRSA, PCR: NEGATIVE
Staphylococcus aureus: NEGATIVE

## 2014-07-26 LAB — ABO/RH: ABO/RH(D): A POS

## 2014-07-29 ENCOUNTER — Other Ambulatory Visit: Payer: Self-pay | Admitting: Surgical

## 2014-07-29 NOTE — Progress Notes (Signed)
[facility] and requested orders for patient's 07/30/15 surgery.

## 2014-07-30 ENCOUNTER — Encounter (HOSPITAL_COMMUNITY): Payer: Self-pay | Admitting: *Deleted

## 2014-07-30 ENCOUNTER — Encounter (HOSPITAL_COMMUNITY)
Admission: RE | Disposition: A | Discharge status: Home Health | Payer: Self-pay | Source: Ambulatory Visit | Attending: Orthopedic Surgery

## 2014-07-30 ENCOUNTER — Inpatient Hospital Stay (HOSPITAL_COMMUNITY): Payer: Medicare Other | Admitting: Anesthesiology

## 2014-07-30 ENCOUNTER — Inpatient Hospital Stay (HOSPITAL_COMMUNITY)
Admission: RE | Admit: 2014-07-30 | Discharge: 2014-08-02 | DRG: 470 | Disposition: A | Discharge status: Home Health | Payer: Medicare Other | Source: Ambulatory Visit | Attending: Orthopedic Surgery | Admitting: Orthopedic Surgery

## 2014-07-30 DIAGNOSIS — Z96659 Presence of unspecified artificial knee joint: Secondary | ICD-10-CM

## 2014-07-30 DIAGNOSIS — M1711 Unilateral primary osteoarthritis, right knee: Secondary | ICD-10-CM | POA: Diagnosis present

## 2014-07-30 DIAGNOSIS — M797 Fibromyalgia: Secondary | ICD-10-CM | POA: Diagnosis present

## 2014-07-30 DIAGNOSIS — D62 Acute posthemorrhagic anemia: Secondary | ICD-10-CM | POA: Diagnosis not present

## 2014-07-30 DIAGNOSIS — Z01818 Encounter for other preprocedural examination: Secondary | ICD-10-CM

## 2014-07-30 DIAGNOSIS — Z79899 Other long term (current) drug therapy: Secondary | ICD-10-CM | POA: Diagnosis not present

## 2014-07-30 DIAGNOSIS — Z01812 Encounter for preprocedural laboratory examination: Secondary | ICD-10-CM | POA: Diagnosis not present

## 2014-07-30 DIAGNOSIS — Z800 Family history of malignant neoplasm of digestive organs: Secondary | ICD-10-CM | POA: Diagnosis not present

## 2014-07-30 DIAGNOSIS — M25561 Pain in right knee: Secondary | ICD-10-CM | POA: Diagnosis present

## 2014-07-30 DIAGNOSIS — Z6834 Body mass index (BMI) 34.0-34.9, adult: Secondary | ICD-10-CM

## 2014-07-30 DIAGNOSIS — Z8249 Family history of ischemic heart disease and other diseases of the circulatory system: Secondary | ICD-10-CM | POA: Diagnosis not present

## 2014-07-30 DIAGNOSIS — M4322 Fusion of spine, cervical region: Secondary | ICD-10-CM | POA: Diagnosis present

## 2014-07-30 DIAGNOSIS — M659 Synovitis and tenosynovitis, unspecified: Secondary | ICD-10-CM | POA: Diagnosis present

## 2014-07-30 HISTORY — PX: TOTAL KNEE ARTHROPLASTY: SHX125

## 2014-07-30 LAB — TYPE AND SCREEN
ABO/RH(D): A POS
Antibody Screen: NEGATIVE

## 2014-07-30 SURGERY — ARTHROPLASTY, KNEE, TOTAL
Anesthesia: General | Site: Knee | Laterality: Right

## 2014-07-30 MED ORDER — HYDROMORPHONE HCL 2 MG PO TABS
2.0000 mg | ORAL_TABLET | ORAL | Status: DC | PRN
  Administered 2014-07-30 – 2014-08-01 (×12): 2 mg via ORAL
  Filled 2014-07-30 (×13): qty 1

## 2014-07-30 MED ORDER — ONDANSETRON HCL 4 MG/2ML IJ SOLN
4.0000 mg | Freq: Four times a day (QID) | INTRAMUSCULAR | Status: DC | PRN
  Administered 2014-07-30: 4 mg via INTRAVENOUS
  Filled 2014-07-30: qty 2

## 2014-07-30 MED ORDER — LIDOCAINE HCL (CARDIAC) 20 MG/ML IV SOLN
INTRAVENOUS | Status: DC | PRN
  Administered 2014-07-30: 50 mg via INTRAVENOUS

## 2014-07-30 MED ORDER — HYDROMORPHONE HCL 1 MG/ML IJ SOLN
INTRAMUSCULAR | Status: AC
  Filled 2014-07-30: qty 1

## 2014-07-30 MED ORDER — SODIUM CHLORIDE 0.9 % IJ SOLN
INTRAMUSCULAR | Status: AC
  Filled 2014-07-30: qty 10

## 2014-07-30 MED ORDER — FERROUS SULFATE 325 (65 FE) MG PO TABS
325.0000 mg | ORAL_TABLET | Freq: Three times a day (TID) | ORAL | Status: DC
  Administered 2014-07-31 – 2014-08-02 (×7): 325 mg via ORAL
  Filled 2014-07-30 (×13): qty 1

## 2014-07-30 MED ORDER — NEOSTIGMINE METHYLSULFATE 10 MG/10ML IV SOLN
INTRAVENOUS | Status: DC | PRN
  Administered 2014-07-30: 2 mg via INTRAVENOUS

## 2014-07-30 MED ORDER — BISACODYL 5 MG PO TBEC: 5.0000 mg | DELAYED_RELEASE_TABLET | Freq: Every day | ORAL | Status: DC | PRN

## 2014-07-30 MED ORDER — HYDROMORPHONE HCL 1 MG/ML IJ SOLN
0.2500 mg | INTRAMUSCULAR | Status: DC | PRN
  Administered 2014-07-30 (×2): 0.5 mg via INTRAVENOUS

## 2014-07-30 MED ORDER — TRANEXAMIC ACID 100 MG/ML IV SOLN
1000.0000 mg | INTRAVENOUS | Status: AC
  Administered 2014-07-30: 1000 mg via INTRAVENOUS
  Filled 2014-07-30: qty 10

## 2014-07-30 MED ORDER — ROPINIROLE HCL 1 MG PO TABS
1.0000 mg | ORAL_TABLET | Freq: Every day | ORAL | Status: DC
  Administered 2014-07-31: 1 mg via ORAL
  Filled 2014-07-30 (×4): qty 1

## 2014-07-30 MED ORDER — LIDOCAINE HCL (CARDIAC) 20 MG/ML IV SOLN
INTRAVENOUS | Status: AC
  Filled 2014-07-30: qty 5

## 2014-07-30 MED ORDER — GLYCOPYRROLATE 0.2 MG/ML IJ SOLN
INTRAMUSCULAR | Status: AC
  Filled 2014-07-30: qty 2

## 2014-07-30 MED ORDER — PROMETHAZINE HCL 25 MG/ML IJ SOLN
INTRAMUSCULAR | Status: AC
  Filled 2014-07-30: qty 1

## 2014-07-30 MED ORDER — SODIUM CHLORIDE 0.9 % IR SOLN
Status: DC | PRN
  Administered 2014-07-30: 500 mL

## 2014-07-30 MED ORDER — PROPOFOL 10 MG/ML IV BOLUS
INTRAVENOUS | Status: DC | PRN
  Administered 2014-07-30: 150 mg via INTRAVENOUS

## 2014-07-30 MED ORDER — CHLORHEXIDINE GLUCONATE 4 % EX LIQD: 60.0000 mL | Freq: Once | CUTANEOUS | Status: DC

## 2014-07-30 MED ORDER — RIVAROXABAN 10 MG PO TABS
10.0000 mg | ORAL_TABLET | Freq: Every day | ORAL | Status: DC
  Administered 2014-07-31 – 2014-08-02 (×3): 10 mg via ORAL
  Filled 2014-07-30 (×4): qty 1

## 2014-07-30 MED ORDER — CETYLPYRIDINIUM CHLORIDE 0.05 % MT LIQD
7.0000 mL | Freq: Two times a day (BID) | OROMUCOSAL | Status: DC
  Administered 2014-07-30 – 2014-07-31 (×3): 7 mL via OROMUCOSAL

## 2014-07-30 MED ORDER — HYDROMORPHONE HCL 1 MG/ML IJ SOLN
1.0000 mg | INTRAMUSCULAR | Status: DC | PRN
  Administered 2014-07-30 – 2014-08-01 (×10): 1 mg via INTRAVENOUS
  Filled 2014-07-30 (×10): qty 1

## 2014-07-30 MED ORDER — HYDROXYZINE HCL 25 MG PO TABS
100.0000 mg | ORAL_TABLET | Freq: Two times a day (BID) | ORAL | Status: DC | PRN
  Administered 2014-07-30 – 2014-07-31 (×2): 100 mg via ORAL
  Filled 2014-07-30 (×2): qty 4

## 2014-07-30 MED ORDER — SODIUM CHLORIDE 0.9 % IR SOLN
Status: AC
  Filled 2014-07-30: qty 1

## 2014-07-30 MED ORDER — PROPOFOL 10 MG/ML IV BOLUS
INTRAVENOUS | Status: AC
  Filled 2014-07-30: qty 20

## 2014-07-30 MED ORDER — LACTATED RINGERS IV SOLN: INTRAVENOUS | Status: DC

## 2014-07-30 MED ORDER — EPHEDRINE SULFATE 50 MG/ML IJ SOLN
INTRAMUSCULAR | Status: AC
  Filled 2014-07-30: qty 1

## 2014-07-30 MED ORDER — MENTHOL 3 MG MT LOZG
1.0000 | LOZENGE | OROMUCOSAL | Status: DC | PRN
  Filled 2014-07-30: qty 9

## 2014-07-30 MED ORDER — PREGABALIN 75 MG PO CAPS
225.0000 mg | ORAL_CAPSULE | Freq: Two times a day (BID) | ORAL | Status: DC
  Administered 2014-07-30 – 2014-08-02 (×6): 225 mg via ORAL
  Filled 2014-07-30 (×6): qty 3

## 2014-07-30 MED ORDER — HYDROMORPHONE HCL 1 MG/ML IJ SOLN
INTRAMUSCULAR | Status: DC | PRN
  Administered 2014-07-30: 1 mg via INTRAVENOUS
  Administered 2014-07-30 (×2): 0.5 mg via INTRAVENOUS

## 2014-07-30 MED ORDER — BUPROPION HCL ER (XL) 150 MG PO TB24
150.0000 mg | ORAL_TABLET | Freq: Every day | ORAL | Status: DC
  Filled 2014-07-30 (×4): qty 1

## 2014-07-30 MED ORDER — METHOCARBAMOL 1000 MG/10ML IJ SOLN
500.0000 mg | Freq: Four times a day (QID) | INTRAVENOUS | Status: DC | PRN
  Administered 2014-07-30: 500 mg via INTRAVENOUS
  Filled 2014-07-30 (×2): qty 5

## 2014-07-30 MED ORDER — NEOSTIGMINE METHYLSULFATE 10 MG/10ML IV SOLN
INTRAVENOUS | Status: AC
  Filled 2014-07-30: qty 1

## 2014-07-30 MED ORDER — SUCCINYLCHOLINE CHLORIDE 20 MG/ML IJ SOLN
INTRAMUSCULAR | Status: DC | PRN
Start: 2014-07-30 — End: 2014-07-30
  Administered 2014-07-30: 100 mg via INTRAVENOUS

## 2014-07-30 MED ORDER — DEXAMETHASONE SODIUM PHOSPHATE 10 MG/ML IJ SOLN
INTRAMUSCULAR | Status: AC
  Filled 2014-07-30: qty 1

## 2014-07-30 MED ORDER — ONDANSETRON HCL 4 MG PO TABS: 4.0000 mg | ORAL_TABLET | Freq: Four times a day (QID) | ORAL | Status: DC | PRN

## 2014-07-30 MED ORDER — BUPIVACAINE HCL (PF) 0.25 % IJ SOLN
INTRAMUSCULAR | Status: DC | PRN
  Administered 2014-07-30: 20 mL

## 2014-07-30 MED ORDER — LACTATED RINGERS IV SOLN
INTRAVENOUS | Status: DC
  Administered 2014-07-30: 19:00:00 via INTRAVENOUS

## 2014-07-30 MED ORDER — ONDANSETRON HCL 4 MG/2ML IJ SOLN
INTRAMUSCULAR | Status: AC
  Filled 2014-07-30: qty 2

## 2014-07-30 MED ORDER — ALBUTEROL SULFATE ER 4 MG PO TB12: 16.0000 mg | ORAL_TABLET | Freq: Two times a day (BID) | ORAL | Status: DC

## 2014-07-30 MED ORDER — CELECOXIB 200 MG PO CAPS
200.0000 mg | ORAL_CAPSULE | Freq: Two times a day (BID) | ORAL | Status: DC
  Administered 2014-07-30 – 2014-08-02 (×6): 200 mg via ORAL
  Filled 2014-07-30 (×8): qty 1

## 2014-07-30 MED ORDER — FENTANYL CITRATE 0.05 MG/ML IJ SOLN
INTRAMUSCULAR | Status: AC
  Filled 2014-07-30: qty 5

## 2014-07-30 MED ORDER — HYDROMORPHONE HCL 2 MG/ML IJ SOLN
INTRAMUSCULAR | Status: AC
  Filled 2014-07-30: qty 1

## 2014-07-30 MED ORDER — FLEET ENEMA 7-19 GM/118ML RE ENEM: 1.0000 | ENEMA | Freq: Once | RECTAL | Status: AC | PRN

## 2014-07-30 MED ORDER — GLYCOPYRROLATE 0.2 MG/ML IJ SOLN
INTRAMUSCULAR | Status: DC | PRN
  Administered 2014-07-30: 0.4 mg via INTRAVENOUS

## 2014-07-30 MED ORDER — HYDROMORPHONE HCL 1 MG/ML IJ SOLN
0.2500 mg | INTRAMUSCULAR | Status: DC | PRN
  Administered 2014-07-30 (×4): 0.5 mg via INTRAVENOUS

## 2014-07-30 MED ORDER — SODIUM CHLORIDE 0.9 % IJ SOLN
INTRAMUSCULAR | Status: AC
  Filled 2014-07-30: qty 50

## 2014-07-30 MED ORDER — CEFAZOLIN SODIUM-DEXTROSE 2-3 GM-% IV SOLR
INTRAVENOUS | Status: AC
  Filled 2014-07-30: qty 50

## 2014-07-30 MED ORDER — BUPIVACAINE LIPOSOME 1.3 % IJ SUSP
20.0000 mL | Freq: Once | INTRAMUSCULAR | Status: AC
  Administered 2014-07-30: 20 mL
  Filled 2014-07-30: qty 20

## 2014-07-30 MED ORDER — ALUM & MAG HYDROXIDE-SIMETH 200-200-20 MG/5ML PO SUSP: 30.0000 mL | ORAL | Status: DC | PRN

## 2014-07-30 MED ORDER — ONDANSETRON HCL 4 MG/2ML IJ SOLN
INTRAMUSCULAR | Status: DC | PRN
  Administered 2014-07-30 (×2): 4 mg via INTRAVENOUS

## 2014-07-30 MED ORDER — METHOCARBAMOL 500 MG PO TABS
500.0000 mg | ORAL_TABLET | Freq: Four times a day (QID) | ORAL | Status: DC | PRN
  Administered 2014-07-30 – 2014-08-01 (×6): 500 mg via ORAL
  Filled 2014-07-30 (×6): qty 1

## 2014-07-30 MED ORDER — CEFAZOLIN SODIUM-DEXTROSE 2-3 GM-% IV SOLR
2.0000 g | INTRAVENOUS | Status: AC
  Administered 2014-07-30: 2 g via INTRAVENOUS

## 2014-07-30 MED ORDER — LACTATED RINGERS IV SOLN
INTRAVENOUS | Status: DC | PRN
  Administered 2014-07-30 (×2): via INTRAVENOUS

## 2014-07-30 MED ORDER — FLUOXETINE HCL 20 MG PO CAPS
80.0000 mg | ORAL_CAPSULE | Freq: Every day | ORAL | Status: DC
  Filled 2014-07-30 (×4): qty 4

## 2014-07-30 MED ORDER — CEFAZOLIN SODIUM 1-5 GM-% IV SOLN
1.0000 g | Freq: Four times a day (QID) | INTRAVENOUS | Status: AC
  Administered 2014-07-30 – 2014-07-31 (×2): 1 g via INTRAVENOUS
  Filled 2014-07-30 (×3): qty 50

## 2014-07-30 MED ORDER — BUPIVACAINE HCL (PF) 0.25 % IJ SOLN
INTRAMUSCULAR | Status: AC
  Filled 2014-07-30: qty 30

## 2014-07-30 MED ORDER — PROMETHAZINE HCL 25 MG/ML IJ SOLN
6.2500 mg | INTRAMUSCULAR | Status: DC | PRN
  Administered 2014-07-30: 12.5 mg via INTRAVENOUS

## 2014-07-30 MED ORDER — ALBUTEROL SULFATE ER 4 MG PO TB12
16.0000 mg | ORAL_TABLET | Freq: Two times a day (BID) | ORAL | Status: DC
  Administered 2014-07-30 – 2014-08-02 (×5): 16 mg via ORAL
  Filled 2014-07-30 (×7): qty 4

## 2014-07-30 MED ORDER — FENTANYL CITRATE 0.05 MG/ML IJ SOLN
INTRAMUSCULAR | Status: DC | PRN
  Administered 2014-07-30: 50 ug via INTRAVENOUS
  Administered 2014-07-30: 100 ug via INTRAVENOUS
  Administered 2014-07-30 (×2): 50 ug via INTRAVENOUS

## 2014-07-30 MED ORDER — MIDAZOLAM HCL 2 MG/2ML IJ SOLN
INTRAMUSCULAR | Status: AC
  Filled 2014-07-30: qty 2

## 2014-07-30 MED ORDER — POLYETHYLENE GLYCOL 3350 17 G PO PACK: 17.0000 g | PACK | Freq: Every day | ORAL | Status: DC | PRN

## 2014-07-30 MED ORDER — MIDAZOLAM HCL 5 MG/5ML IJ SOLN
INTRAMUSCULAR | Status: DC | PRN
  Administered 2014-07-30 (×2): 1 mg via INTRAVENOUS

## 2014-07-30 MED ORDER — THROMBIN 5000 UNITS EX SOLR
CUTANEOUS | Status: AC
  Filled 2014-07-30: qty 5000

## 2014-07-30 MED ORDER — SODIUM CHLORIDE 0.9 % IJ SOLN
INTRAMUSCULAR | Status: DC | PRN
  Administered 2014-07-30: 20 mL

## 2014-07-30 MED ORDER — ALBUTEROL SULFATE ER 4 MG PO TB12
16.0000 mg | ORAL_TABLET | Freq: Two times a day (BID) | ORAL | Status: DC
Start: 2014-07-30 — End: 2014-07-30
  Filled 2014-07-30: qty 4

## 2014-07-30 MED ORDER — ROCURONIUM BROMIDE 100 MG/10ML IV SOLN
INTRAVENOUS | Status: DC | PRN
  Administered 2014-07-30: 40 mg via INTRAVENOUS

## 2014-07-30 MED ORDER — SODIUM CHLORIDE 0.9 % IR SOLN
Status: DC | PRN
  Administered 2014-07-30: 1000 mL

## 2014-07-30 MED ORDER — DEXAMETHASONE SODIUM PHOSPHATE 10 MG/ML IJ SOLN
INTRAMUSCULAR | Status: DC | PRN
  Administered 2014-07-30: 10 mg via INTRAVENOUS

## 2014-07-30 MED ORDER — PHENOL 1.4 % MT LIQD
1.0000 | OROMUCOSAL | Status: DC | PRN
  Filled 2014-07-30: qty 177

## 2014-07-30 MED ORDER — LACTATED RINGERS IV SOLN
INTRAVENOUS | Status: DC
  Administered 2014-07-30: 1000 mL via INTRAVENOUS

## 2014-07-30 MED ORDER — ROCURONIUM BROMIDE 100 MG/10ML IV SOLN
INTRAVENOUS | Status: AC
  Filled 2014-07-30: qty 1

## 2014-07-30 SURGICAL SUPPLY — 77 items
ADH SKN CLS APL DERMABOND .7 (GAUZE/BANDAGES/DRESSINGS) ×1
BAG DECANTER FOR FLEXI CONT (MISCELLANEOUS) ×3 IMPLANT
BAG SPEC THK2 15X12 ZIP CLS (MISCELLANEOUS)
BAG ZIPLOCK 12X15 (MISCELLANEOUS) IMPLANT
BANDAGE ELASTIC 4 VELCRO ST LF (GAUZE/BANDAGES/DRESSINGS) ×3 IMPLANT
BANDAGE ELASTIC 6 VELCRO ST LF (GAUZE/BANDAGES/DRESSINGS) ×3 IMPLANT
BANDAGE ESMARK 6X9 LF (GAUZE/BANDAGES/DRESSINGS) ×1 IMPLANT
BLADE SAG 18X100X1.27 (BLADE) ×3 IMPLANT
BLADE SAW SGTL 11.0X1.19X90.0M (BLADE) ×3 IMPLANT
BNDG CMPR 9X6 STRL LF SNTH (GAUZE/BANDAGES/DRESSINGS) ×1
BNDG ESMARK 6X9 LF (GAUZE/BANDAGES/DRESSINGS) ×3
BONE CEMENT GENTAMICIN (Cement) ×6 IMPLANT
CAP KNEE TOTAL 3 SIGMA ×2 IMPLANT
CEMENT BONE GENTAMICIN 40 (Cement) ×2 IMPLANT
CUFF TOURN SGL QUICK 34 (TOURNIQUET CUFF) ×3
CUFF TRNQT CYL 34X4X40X1 (TOURNIQUET CUFF) ×1 IMPLANT
DERMABOND ADVANCED (GAUZE/BANDAGES/DRESSINGS) ×2
DERMABOND ADVANCED .7 DNX12 (GAUZE/BANDAGES/DRESSINGS) IMPLANT
DRAPE EXTREMITY T 121X128X90 (DRAPE) ×3 IMPLANT
DRAPE INCISE IOBAN 66X45 STRL (DRAPES) IMPLANT
DRAPE POUCH INSTRU U-SHP 10X18 (DRAPES) ×3 IMPLANT
DRAPE U-SHAPE 47X51 STRL (DRAPES) ×3 IMPLANT
DRSG AQUACEL AG ADV 3.5X10 (GAUZE/BANDAGES/DRESSINGS) ×3 IMPLANT
DRSG PAD ABDOMINAL 8X10 ST (GAUZE/BANDAGES/DRESSINGS) IMPLANT
DRSG TEGADERM 4X4.75 (GAUZE/BANDAGES/DRESSINGS) ×3 IMPLANT
DURAPREP 26ML APPLICATOR (WOUND CARE) ×3 IMPLANT
ELECT REM PT RETURN 9FT ADLT (ELECTROSURGICAL) ×3
ELECTRODE REM PT RTRN 9FT ADLT (ELECTROSURGICAL) ×1 IMPLANT
EVACUATOR 1/8 PVC DRAIN (DRAIN) ×3 IMPLANT
FACESHIELD WRAPAROUND (MASK) ×15 IMPLANT
FACESHIELD WRAPAROUND OR TEAM (MASK) ×5 IMPLANT
GAUZE SPONGE 2X2 8PLY STRL LF (GAUZE/BANDAGES/DRESSINGS) ×1 IMPLANT
GLOVE BIOGEL PI IND STRL 6.5 (GLOVE) ×1 IMPLANT
GLOVE BIOGEL PI IND STRL 8 (GLOVE) ×1 IMPLANT
GLOVE BIOGEL PI INDICATOR 6.5 (GLOVE) ×2
GLOVE BIOGEL PI INDICATOR 8 (GLOVE) ×2
GLOVE ECLIPSE 8.0 STRL XLNG CF (GLOVE) ×6 IMPLANT
GLOVE SURG SS PI 6.5 STRL IVOR (GLOVE) ×3 IMPLANT
GOWN STRL REUS W/TWL LRG LVL3 (GOWN DISPOSABLE) ×3 IMPLANT
GOWN STRL REUS W/TWL XL LVL3 (GOWN DISPOSABLE) ×3 IMPLANT
HANDPIECE INTERPULSE COAX TIP (DISPOSABLE) ×3
IMMOBILIZER KNEE 20 (SOFTGOODS) ×3
IMMOBILIZER KNEE 20 THIGH 36 (SOFTGOODS) ×1 IMPLANT
KIT BASIN OR (CUSTOM PROCEDURE TRAY) ×3 IMPLANT
LIQUID BAND (GAUZE/BANDAGES/DRESSINGS) ×3 IMPLANT
MANIFOLD NEPTUNE II (INSTRUMENTS) ×3 IMPLANT
NDL SAFETY ECLIPSE 18X1.5 (NEEDLE) IMPLANT
NEEDLE HYPO 18GX1.5 SHARP (NEEDLE) ×3
NEEDLE HYPO 22GX1.5 SAFETY (NEEDLE) ×6 IMPLANT
NS IRRIG 1000ML POUR BTL (IV SOLUTION) IMPLANT
PACK TOTAL JOINT (CUSTOM PROCEDURE TRAY) ×3 IMPLANT
PADDING CAST COTTON 6X4 STRL (CAST SUPPLIES) IMPLANT
PEN SKIN MARKING BROAD (MISCELLANEOUS) ×3 IMPLANT
POSITIONER SURGICAL ARM (MISCELLANEOUS) ×3 IMPLANT
SET HNDPC FAN SPRY TIP SCT (DISPOSABLE) ×1 IMPLANT
SET PAD KNEE POSITIONER (MISCELLANEOUS) ×3 IMPLANT
SPONGE GAUZE 2X2 STER 10/PKG (GAUZE/BANDAGES/DRESSINGS) ×2
SPONGE LAP 18X18 X RAY DECT (DISPOSABLE) ×2 IMPLANT
SPONGE SURGIFOAM ABS GEL 100 (HEMOSTASIS) ×3 IMPLANT
STAPLER VISISTAT 35W (STAPLE) IMPLANT
SUCTION FRAZIER 12FR DISP (SUCTIONS) ×3 IMPLANT
SUT BONE WAX W31G (SUTURE) ×3 IMPLANT
SUT MNCRL AB 4-0 PS2 18 (SUTURE) ×3 IMPLANT
SUT VIC AB 1 CT1 27 (SUTURE) ×9
SUT VIC AB 1 CT1 27XBRD ANTBC (SUTURE) ×2 IMPLANT
SUT VIC AB 2-0 CT1 27 (SUTURE) ×9
SUT VIC AB 2-0 CT1 TAPERPNT 27 (SUTURE) ×3 IMPLANT
SUT VLOC 180 0 24IN GS25 (SUTURE) ×3 IMPLANT
SYR 20CC LL (SYRINGE) ×6 IMPLANT
SYR 50ML LL SCALE MARK (SYRINGE) ×3 IMPLANT
TOWEL OR 17X26 10 PK STRL BLUE (TOWEL DISPOSABLE) ×3 IMPLANT
TOWEL OR NON WOVEN STRL DISP B (DISPOSABLE) IMPLANT
TOWER CARTRIDGE SMART MIX (DISPOSABLE) ×3 IMPLANT
TRAY FOLEY CATH 14FRSI W/METER (CATHETERS) ×3 IMPLANT
WATER STERILE IRR 1500ML POUR (IV SOLUTION) ×3 IMPLANT
WRAP KNEE MAXI GEL POST OP (GAUZE/BANDAGES/DRESSINGS) ×3 IMPLANT
YANKAUER SUCT BULB TIP 10FT TU (MISCELLANEOUS) ×3 IMPLANT

## 2014-07-30 NOTE — Anesthesia Preprocedure Evaluation (Addendum)
Anesthesia Evaluation  Patient identified by MRN, date of birth, ID band Patient awake    Reviewed: Allergy & Precautions, NPO status , Patient's Chart, lab work & pertinent test results  History of Anesthesia Complications (+) PONV  Airway Mallampati: II  TM Distance: >3 FB Neck ROM: Full    Dental no notable dental hx.    Pulmonary neg pulmonary ROS, shortness of breath,  breath sounds clear to auscultation  Pulmonary exam normal       Cardiovascular negative cardio ROS  Rhythm:Regular Rate:Normal     Neuro/Psych PSYCHIATRIC DISORDERS Anxiety Depression  Neuromuscular disease negative neurological ROS     GI/Hepatic negative GI ROS, Neg liver ROS, Hx of liver failure in past, nno problems now   Endo/Other  negative endocrine ROS  Renal/GU negative Renal ROS  negative genitourinary   Musculoskeletal  (+) Arthritis -, Fibromyalgia -  Abdominal (+) + obese,   Peds negative pediatric ROS (+)  Hematology negative hematology ROS (+)   Anesthesia Other Findings   Reproductive/Obstetrics negative OB ROS                            Anesthesia Physical Anesthesia Plan  ASA: II  Anesthesia Plan: General   Post-op Pain Management:    Induction: Intravenous  Airway Management Planned: LMA and Oral ETT  Additional Equipment:   Intra-op Plan:   Post-operative Plan: Extubation in OR  Informed Consent: I have reviewed the patients History and Physical, chart, labs and discussed the procedure including the risks, benefits and alternatives for the proposed anesthesia with the patient or authorized representative who has indicated his/her understanding and acceptance.   Dental advisory given  Plan Discussed with: CRNA and Surgeon  Anesthesia Plan Comments:         Anesthesia Quick Evaluation

## 2014-07-30 NOTE — BH Specialist Note (Signed)
Dr. Singer in [place]a [place] look at lt wrist. Waldo LaineWare of continued knee pain at 10. Additional orders given.

## 2014-07-30 NOTE — Anesthesia Postprocedure Evaluation (Signed)
Anesthesia Post Note  Patient: [name]  Procedure(s) Performed: Procedure(s) (LRB): RIGHT TOTAL KNEE ARTHROPLASTY (Right)  Anesthesia type: general  Patient location: PACU  Post pain: Pain level controlled  Post assessment: Patient's Cardiovascular Status Stable  Last Vitals:  Filed Vitals:   07/30/14 1700  BP: 146/73  Pulse: 65  Temp: 36.7 C  Resp: 15    Post vital signs: Reviewed and stable  Level of consciousness: sedated  Complications: No apparent anesthesia complications

## 2014-07-30 NOTE — Brief Op Note (Signed)
07/30/2014  3:00 PM  PATIENT:  Linnell FullingPatricia R Flener  54 y.o. female  PRE-OPERATIVE DIAGNOSIS:  right knee Primar osteoarthritis  POST-OPERATIVE DIAGNOSIS:  right knee Primary osteoarthritis  PROCEDURE:  Procedure(s): RIGHT TOTAL KNEE ARTHROPLASTY (Right)  SURGEON:  Surgeon(s) and Role:    * Ranee Gosselinonald Angelyna Henderson, MD - Primary  PHYSICIAN ASSISTANT: Dimitri PedAmber Constable PA  ASSISTANTS: Dimitri PedAmber Constable PA  ANESTHESIA:   general  EBL:  Total I/O In: 1000 [I.V.:1000] Out: 225 [Urine:225]  BLOOD ADMINISTERED:none  DRAINS: (One) Hemovact drain(s) in the Right Knee with  Suction Open   LOCAL MEDICATIONS USED:  MARCAINE 20cc of 0.25% plain and20cc of Exparel with 20cc of Normal Saline.    SPECIMEN:  No Specimen  DISPOSITION OF SPECIMEN:  N/A  COUNTS:  YES  TOURNIQUET:  * Missing tourniquet times found for documented tourniquets in log:  [id number] *  DICTATION: .Other Dictation: Dictation Number [id number]  PLAN OF CARE: Admit to inpatient   PATIENT DISPOSITION:  Stable in OR   Delay start of Pharmacological VTE agent (>24hrs) due to surgical blood loss or risk of bleeding: yes

## 2014-07-30 NOTE — Progress Notes (Signed)
Pt c/o pain in left wrist.  Described as burning.  Not exacerbated by movement or pressure.  No erythema or bruising or swelling seen.  IV is not infiltrated. Will continue to monitor.  No obvious etiology.

## 2014-07-30 NOTE — Progress Notes (Signed)
C/O posterior lt wrist pain. Able to bend, warm to touch with brisk cap refill. Good radial pulse. IV lt hand unremarkable; no swelling or redness noted. Elevated on pillow.

## 2014-07-30 NOTE — Anesthesia Procedure Notes (Signed)
Procedure Name: Intubation Date/Time: 07/30/2014 12:55 PM Performed by: Antonela Freiman, Nuala AlphaKRISTOPHER Pre-anesthesia Checklist: Patient identified, Emergency Drugs available, Suction available, Patient being monitored and Timeout performed Patient Re-evaluated:Patient Re-evaluated prior to inductionOxygen Delivery Method: Circle system utilized Preoxygenation: Pre-oxygenation with 100% oxygen Intubation Type: IV induction Ventilation: Mask ventilation without difficulty Laryngoscope Size: Mac and 4 Grade View: Grade II Tube type: Oral Tube size: 7.5 mm Number of attempts: 1 Airway Equipment and Method: Stylet Placement Confirmation: ETT inserted through vocal cords under direct vision,  positive ETCO2,  CO2 detector and breath sounds checked- equal and bilateral Secured at: 21 cm Tube secured with: Tape Dental Injury: Teeth and Oropharynx as per pre-operative assessment

## 2014-07-30 NOTE — Interval H&P Note (Signed)
History and Physical Interval Note:  07/30/2014 12:40 PM  [name]  has presented today for surgery, with the diagnosis of right knee osteoarthritis  The various methods of treatment have been discussed with the patient and family. After consideration of risks, benefits and other options for treatment, the patient has consented to  Procedure(s): RIGHT TOTAL KNEE ARTHROPLASTY (Right) as a surgical intervention .  The patient's history has been reviewed, patient examined, no change in status, stable for surgery.  I have reviewed the patient's chart and labs.  Questions were answered to the patient's satisfaction.     [name]

## 2014-07-30 NOTE — Progress Notes (Signed)
Utilization review completed.  

## 2014-07-30 NOTE — Transfer of Care (Signed)
Immediate Anesthesia Transfer of Care Note  Patient: [name]  Procedure(s) Performed: Procedure(s) (LRB): RIGHT TOTAL KNEE ARTHROPLASTY (Right)  Patient Location: PACU  Anesthesia Type: General  Level of Consciousness: sedated, patient cooperative and responds to stimulation  Airway & Oxygen Therapy: Patient Spontanous Breathing and Patient connected to face mask oxgen  Post-op Assessment: Report given to PACU RN and Post -op Vital signs reviewed and stable  Post vital signs: Reviewed and stable  Complications: No apparent anesthesia complications

## 2014-07-31 ENCOUNTER — Encounter (HOSPITAL_COMMUNITY): Payer: Self-pay | Admitting: Orthopedic Surgery

## 2014-07-31 LAB — CBC
HEMATOCRIT: 37.2 % (ref 36.0–46.0)
Hemoglobin: 11.5 g/dL — ABNORMAL LOW (ref 12.0–15.0)
MCH: 26.7 pg (ref 26.0–34.0)
MCHC: 30.9 g/dL (ref 30.0–36.0)
MCV: 86.5 fL (ref 78.0–100.0)
PLATELETS: 342 10*3/uL (ref 150–400)
RBC: 4.3 MIL/uL (ref 3.87–5.11)
RDW: 13.6 % (ref 11.5–15.5)
WBC: 16.2 10*3/uL — AB (ref 4.0–10.5)

## 2014-07-31 LAB — BASIC METABOLIC PANEL
ANION GAP: 13 (ref 5–15)
BUN: 8 mg/dL (ref 6–23)
CALCIUM: 8.9 mg/dL (ref 8.4–10.5)
CO2: 24 mmol/L (ref 19–32)
Chloride: 105 mmol/L (ref 96–112)
Creatinine, Ser: 0.89 mg/dL (ref 0.50–1.10)
GFR calc Af Amer: 85 mL/min — ABNORMAL LOW (ref >90)
GFR calc non Af Amer: 73 mL/min — ABNORMAL LOW (ref >90)
Glucose, Bld: 224 mg/dL — ABNORMAL HIGH (ref 70–99)
POTASSIUM: 4.1 mmol/L (ref 3.5–5.1)
SODIUM: 142 mmol/L (ref 135–145)

## 2014-07-31 MED ORDER — HYDROMORPHONE HCL 2 MG PO TABS
2.0000 mg | ORAL_TABLET | ORAL | Status: DC | PRN
Start: 2014-07-31 — End: 2014-08-02

## 2014-07-31 MED ORDER — FERROUS SULFATE 325 (65 FE) MG PO TABS: 325.0000 mg | ORAL_TABLET | Freq: Three times a day (TID) | ORAL | Status: DC

## 2014-07-31 MED ORDER — RIVAROXABAN 10 MG PO TABS: 10.0000 mg | ORAL_TABLET | Freq: Every day | ORAL | Status: DC

## 2014-07-31 MED ORDER — METHOCARBAMOL 500 MG PO TABS: 500.0000 mg | ORAL_TABLET | Freq: Four times a day (QID) | ORAL | Status: DC | PRN

## 2014-07-31 NOTE — Care Management Note (Addendum)
    Page 1 of 2   08/02/2014     10:53:39 AM CARE MANAGEMENT NOTE 08/02/2014  Patient:  [name],[name]   Account Number:  [id number]  Date Initiated:  07/31/2014  Documentation initiated by:  Lorenda IshiharaPEELE,Corinthian Mizrahi  Subjective/Objective Assessment:   54 yo female admitted s/p Right total knee arthroplasty. PTA lived at home alone.     Action/Plan:   Home when stable   Anticipated DC Date:  08/03/2014   Anticipated DC Plan:  HOME W HOME HEALTH SERVICES      DC Planning Services  CM consult      [facility]  DURABLE MEDICAL EQUIPMENT   Choice offered to / List presented to:     DME arranged  3-N-1  [facility]      DME agency  [facility]     HH arranged  HH-2 PT  HH-4 NURSE'S AIDE      [facility]   Status of service:  Completed, signed off Medicare Important Message given?  YES (If response is "NO", the following Medicare IM given date fields will be blank) Date Medicare IM given:  08/02/2014 Medicare IM given by:  Norcap LodgeEELE,Irais Mottram Date Additional Medicare IM given:   Additional Medicare IM given by:    Discharge Disposition:  HOME W HOME HEALTH SERVICES  Per UR Regulation:  Reviewed for med. necessity/level of care/duration of stay  If discussed at Long Length of Stay Meetings, dates discussed:    Comments:  07-31-14 Lorenda IshiharaSuzanne Lyndsie Wallman RN CM 1100 Spoke with patient at bedside, patient anxious about d/c, wants to go home but does not have good support. CM from ortho office, Noreene LarssonJill, spoke with patient and assisting with d/c planning. Currently plan is for [facility] and CNA for several days to help with ADL's. Will continue to follow for d/c needs.

## 2014-07-31 NOTE — Progress Notes (Signed)
CSW consulted for SNF placement. Met with MD / PA. Pt will return home with [facility] services following hospital d/c. RNCM is assisting with d/c planning.  Werner Lean LCSW [phone]

## 2014-07-31 NOTE — Progress Notes (Signed)
Subjective: 1 Day Post-Op Procedure(s) (LRB): RIGHT TOTAL KNEE ARTHROPLASTY (Right) Patient reports pain as 4 on 0-10 scale.  Doing well.Hemovac Dcd.Will DC home Thursday . CNA will come to her home.  Objective: Vital signs in last 24 hours: Temp:  [98 F (36.7 C)-101.4 F (38.6 C)] 101.4 F (38.6 C) (03/16 0532) Pulse Rate:  [55-109] 109 (03/16 0532) Resp:  [6-18] 18 (03/16 0745) BP: (120-168)/(57-86) 127/64 mmHg (03/16 0532) SpO2:  [93 %-100 %] 96 % (03/16 0745) Weight:  [95.255 kg (210 lb)] 95.255 kg (210 lb) (03/15 1025)  Intake/Output from previous day: 03/15 0701 - 03/16 0700 In: 2118.3 [I.V.:2018.3; IV Piggyback:100] Out: 2630 [Urine:2500; Drains:105; Blood:25] Intake/Output this shift:     Recent Labs  07/31/14 0515  HGB 11.5*    Recent Labs  07/31/14 0515  WBC 16.2*  RBC 4.30  HCT 37.2  PLT 342    Recent Labs  07/31/14 0515  NA 142  K 4.1  CL 105  CO2 24  BUN 8  CREATININE 0.89  GLUCOSE 224*  CALCIUM 8.9   No results for input(s): LABPT, INR in the last 72 hours.  Neurologically intact  Assessment/Plan: 1 Day Post-Op Procedure(s) (LRB): RIGHT TOTAL KNEE ARTHROPLASTY (Right) Up with therapy  Gilmar Bua A 07/31/2014, 10:08 AM

## 2014-07-31 NOTE — Progress Notes (Signed)
Physical Therapy Treatment Patient Details Name: [name] MRN: [id number] DOB: August 08, 1960 Today's Date: 07/31/2014    History of Present Illness s/p R TKA due to arthritis.  Pt reports hx of multiple shoulder, back, neck surgeries.    PT Comments    POD # 1 pm session.  Pt OOB in recliner with mother and father in room visiting.  Applied KI and instructed on use for amb until able to perform active SLR.  Assisted with getting out of recliner to amb in hallway.  Very unsteady/choppy gait.  A bit impulsive and uneven stance due to pain. Returned to room and assisted back to bed to perform TKR TE's followed by ICE.  Pt declines any rec for Rehab at SNF.  "I'm going home" Pt lives alone.  No stairs and ramp entrance.   Follow Up Recommendations  Home health PT (pt refusing SNF)  LPT rec SNF   Equipment Recommendations  Rolling walker with 5" wheels    Recommendations for Other Services       Precautions / Restrictions Precautions Precautions: Fall Precaution Comments: pt reports frequent falls with 2 falls last week Required Braces or Orthoses: Knee Immobilizer - Right Restrictions Weight Bearing Restrictions: No RLE Weight Bearing: Weight bearing as tolerated    Mobility  Bed Mobility Overal bed mobility: Needs Assistance Bed Mobility: Sit to Supine     Supine to sit: Min assist     General bed mobility comments: Assist R LE up onto bed  Transfers Overall transfer level: Needs assistance Equipment used: Rolling walker (2 wheeled) Transfers: Sit to/from Stand Sit to Stand: Min guard;Min assist         General transfer comment: 50% VC's on proper hand placement and 75% VC's safety with turns  Ambulation/Gait Ambulation/Gait assistance: Min assist Ambulation Distance (Feet): 50 Feet Assistive device: Rolling walker (2 wheeled) Gait Pattern/deviations: Step-to pattern;Trunk flexed Gait velocity: decreased Gait velocity interpretation: Below normal speed  for age/gender General Gait Details: Flexed posture with step to pattern.  choppy/unsteady gait.   Stairs            Wheelchair Mobility    Modified Rankin (Stroke Patients Only)       Balance                                    Cognition Arousal/Alertness: Awake/alert Behavior During Therapy: WFL for tasks assessed/performed Overall Cognitive Status: Within Functional Limits for tasks assessed                      Exercises Total Knee Replacement TE's 10 reps B LE ankle pumps 10 reps towel squeezes 10 reps knee presses 10 reps heel slides  10 reps SAQ's 10 reps SLR's 10 reps ABD Followed by ICE   General Comments        Pertinent Vitals/Pain Pain Assessment: 0-10 Pain Score: 8  Pain Location: R knee and some B hips Pain Descriptors / Indicators: Aching;Sore;Tender Pain Intervention(s): Monitored during session;Repositioned;Ice applied    Home Living Family/patient expects to be discharged to:: Private residence Living Arrangements: Children (son works 12 hour shifts and will be unable to A)   Type of Home: Mobile home Home Access: Ramped entrance   Home Layout: One level Home Equipment: Walker - 2 wheels;Wheelchair - manual;Cane - single point Additional Comments: Reports RW is worn out.    Prior Function Level of Independence:  Independent with assistive device(s)      Comments: Uses a cane or RW with household distances.  With longer distances uses wheelchair. Pt reports multiple back surgeries and has intermittent weakness in her legs that cause her legs to get weak and give way.   PT Goals (current goals can now be found in the care plan section) Acute Rehab PT Goals Patient Stated Goal: get home when i am ready PT Goal Formulation: With patient Time For Goal Achievement: 08/07/14 Potential to Achieve Goals: Good Progress towards PT goals: Progressing toward goals    Frequency  7X/week    PT Plan       Co-evaluation             End of Session Equipment Utilized During Treatment: Gait belt Activity Tolerance: Patient limited by pain Patient left: in bed;with call bell/phone within reach     Time: 1345-1425 PT Time Calculation (min) (ACUTE ONLY): 40 min  Charges:  $Gait Training: 8-22 mins $Therapeutic Exercise: 8-22 mins $Therapeutic Activity: 8-22 mins                    G Codes:      Felecia ShellingLori Umaiza Matusik  PTA [facility]  Acute  Rehab Pager      [phone]

## 2014-07-31 NOTE — Evaluation (Signed)
Occupational Therapy Evaluation Patient Details Name: [name] MRN: [id number] DOB: 22-Apr-1961 Today's Date: 07/31/2014    History of Present Illness s/p R TKA due to arthritis.  Pt reports hx of multiple shoulder, back, neck surgeries.   Clinical Impression  Pt is s/p TKA resulting in the deficits listed below (see OT Problem List).  Pt will benefit from skilled OT to increase their safety and independence with ADL and functional mobility for ADL to facilitate discharge to venue listed below.        Follow Up Recommendations  SNF    Equipment Recommendations  None recommended by OT       Precautions / Restrictions Precautions Precautions: Fall Precaution Comments: pt reports frequent falls with 2 falls last week Required Braces or Orthoses: Knee Immobilizer - Right Restrictions RLE Weight Bearing: Weight bearing as tolerated      Mobility Bed Mobility Overal bed mobility: Needs Assistance Bed Mobility: Supine to Sit     Supine to sit: Min guard;HOB elevated     General bed mobility comments: HOB elevated with rails  Transfers Overall transfer level: Needs assistance Equipment used: Rolling walker (2 wheeled) Transfers: Sit to/from Stand Sit to Stand: Min guard         General transfer comment: Initiated sit to stand and pt declined before standing up due to pain. RN brought pain meds         ADL Overall ADL's : Needs assistance/impaired                                       General ADL Comments: Limited eval as pt in pain and upset about DC plan as therapy reccomends SNF and MD/RN had verbaliized pt could go home. Do not feel this pt can go home with pain level , lack of A, and history of falls.    Much of OT session spent discussing DC options to ensure pt will reach Max I level and prevent further falls               Pertinent Vitals/Pain Pain Assessment: 0-10 Pain Score: 10-Worst pain ever Pain Location: pt states pain  is a 50.   Pain Descriptors / Indicators: Aching;Sharp;Heaviness Pain Intervention(s): Limited activity within patient's tolerance;Monitored during session;Patient requesting pain meds-RN notified;Repositioned;Ice applied     Hand Dominance     Extremity/Trunk Assessment Upper Extremity Assessment Upper Extremity Assessment: Defer to OT evaluation RUE Deficits / Details: limited BUE due to multiple shoulder surgeries.  AROM very limited.     Lower Extremity Assessment Lower Extremity Assessment: RLE deficits/detail RLE Deficits / Details: Pt with minimal AROM in R knee due to pain.  Fair quad set and flexion to ~25 degrees.  Pt crying and limited ther ex due to 10/10 pain.  Pt had pain meds within the hour and not tome for more pain meds per nurse. RLE: Unable to fully assess due to pain       Communication Communication Communication: No difficulties   Cognition Arousal/Alertness: Awake/alert Behavior During Therapy: WFL for tasks assessed/performed Overall Cognitive Status: Within Functional Limits for tasks assessed                     General Comments   parents present and are elderly            Home Living Family/patient expects to be discharged to:: Private residence  Living Arrangements: Children (son works 12 hour shifts and will be unable to A)   Type of Home: Mobile home Home Access: Ramped entrance     Home Layout: One level               Home Equipment: Walker - 2 wheels;Wheelchair - manual;Cane - single point   Additional Comments: Reports RW is worn out.      Prior Functioning/Environment Level of Independence: Independent with assistive device(s)        Comments: Uses a cane or RW with household distances.  With longer distances uses wheelchair. Pt reports multiple back surgeries and has intermittent weakness in her legs that cause her legs to get weak and give way.    OT Diagnosis: Generalized weakness;Acute pain   OT Problem  List: Decreased strength;Pain;Decreased activity tolerance;Decreased range of motion   OT Treatment/Interventions: Self-care/ADL training;DME and/or AE instruction;Patient/family education    OT Goals(Current goals can be found in the care plan section) Acute Rehab OT Goals Patient Stated Goal: get home when i am ready OT Goal Formulation: With patient Time For Goal Achievement: 08/14/14 Potential to Achieve Goals: Good ADL Goals Pt Will Perform Grooming: with modified independence;standing Pt Will Perform Lower Body Dressing: with modified independence;sit to/from stand Pt Will Transfer to Toilet: with modified independence;regular height toilet;ambulating Pt Will Perform Toileting - Clothing Manipulation and hygiene: with modified independence;sit to/from stand  OT Frequency: Min 2X/week   Barriers to D/C: Decreased caregiver support             End of Session Equipment Utilized During Treatment: Rolling walker Nurse Communication: Mobility status  Activity Tolerance: Patient tolerated treatment well Patient left: in chair;with call bell/phone within reach   Time: 1236-1306 OT Time Calculation (min): 30 min Charges:  OT General Charges $OT Visit: 1 Procedure OT Evaluation $Initial OT Evaluation Tier I: 1 Procedure OT Treatments $Self Care/Home Management : 8-22 mins G-Codes:    Einar CrowEDDING, [name] D 07/31/2014, 1:19 PM

## 2014-07-31 NOTE — Evaluation (Signed)
Physical Therapy Evaluation Patient Details Name: [name] MRN: [id number] DOB: 1960-08-01 Today's Date: 07/31/2014   History of Present Illness  s/p R TKA due to arthritis.  Pt reports hx of multiple shoulder, back, neck surgeries.  Clinical Impression  Pt admitted with above diagnosis. Pt currently with functional limitations due to the deficits listed below (see PT Problem List).  Pt will benefit from skilled PT to increase their independence and safety with mobility to allow discharge to the venue listed below.  Pt with very limited AROM in R knee due to 10/10 pain.  In gait she was able to ambulate 50', but with slow cadence and flexed posture.  Recommend short term SNF for pt to reach maximum rehab potential prior to going home alone as she lives alone and reports frequent falls.  She cannot remember how many falls she has had in the last 6 months and reports 2 falls just last week.     Follow Up Recommendations SNF    Equipment Recommendations  Rolling walker with 5" wheels    Recommendations for Other Services       Precautions / Restrictions Precautions Precautions: Fall Precaution Comments: pt reports frequent falls with 2 falls last week Required Braces or Orthoses: Knee Immobilizer - Right Restrictions RLE Weight Bearing: Weight bearing as tolerated      Mobility  Bed Mobility Overal bed mobility: Needs Assistance Bed Mobility: Supine to Sit     Supine to sit: Min guard;HOB elevated     General bed mobility comments: HOB elevated with rails  Transfers Overall transfer level: Needs assistance Equipment used: Rolling walker (2 wheeled) Transfers: Sit to/from Stand Sit to Stand: Min guard         General transfer comment: Initiated sit to stand and pt declined before standing up due to pain. RN brought pain meds  Ambulation/Gait Ambulation/Gait assistance: +2 safety/equipment (recliner follow) Ambulation Distance (Feet): 50 Feet Assistive device:  Rolling walker (2 wheeled) Gait Pattern/deviations: Trunk flexed;Step-to pattern Gait velocity: decreased Gait velocity interpretation: Below normal speed for age/gender General Gait Details: Flexed posture with step to pattern.    Stairs            Wheelchair Mobility    Modified Rankin (Stroke Patients Only)       Balance                                             Pertinent Vitals/Pain Pain Assessment: 0-10 Pain Score: 10-Worst pain ever Pain Location: pt states pain is a 50.   Pain Descriptors / Indicators: Aching;Sharp;Heaviness Pain Intervention(s): Limited activity within patient's tolerance;Monitored during session;Patient requesting pain meds-RN notified;Repositioned;Ice applied    Home Living Family/patient expects to be discharged to:: Private residence Living Arrangements: Children (son works 12 hour shifts and will be unable to A)   Type of Home: Mobile home Home Access: Ramped entrance     Home Layout: One level Home Equipment: Walker - 2 wheels;Wheelchair - manual;Cane - single point Additional Comments: Reports RW is worn out.    Prior Function Level of Independence: Independent with assistive device(s)         Comments: Uses a cane or RW with household distances.  With longer distances uses wheelchair. Pt reports multiple back surgeries and has intermittent weakness in her legs that cause her legs to get weak and give way.  Hand Dominance        Extremity/Trunk Assessment   Upper Extremity Assessment: Defer to OT evaluation RUE Deficits / Details: limited BUE due to multiple shoulder surgeries.  AROM very limited.           Lower Extremity Assessment: RLE deficits/detail RLE Deficits / Details: Pt with minimal AROM in R knee due to pain.  Fair quad set and flexion to ~25 degrees.  Pt crying and limited ther ex due to 10/10 pain.  Pt had pain meds within the hour and not tome for more pain meds per nurse.        Communication   Communication: No difficulties  Cognition Arousal/Alertness: Awake/alert Behavior During Therapy: WFL for tasks assessed/performed Overall Cognitive Status: Within Functional Limits for tasks assessed                      General Comments      Exercises Total Joint Exercises Ankle Circles/Pumps: AROM;Supine Quad Sets: Strengthening;Right;5 reps;Supine Heel Slides: AROM;5 reps;Right;Supine      Assessment/Plan    PT Assessment Patient needs continued PT services  PT Diagnosis Difficulty walking;Generalized weakness   PT Problem List Decreased strength;Decreased range of motion;Decreased activity tolerance;Decreased balance;Decreased mobility  PT Treatment Interventions DME instruction;Gait training;Functional mobility training;Therapeutic activities;Therapeutic exercise;Balance training   PT Goals (Current goals can be found in the Care Plan section) Acute Rehab PT Goals Patient Stated Goal: get home when i am ready PT Goal Formulation: With patient Time For Goal Achievement: 08/07/14 Potential to Achieve Goals: Good    Frequency 7X/week   Barriers to discharge Decreased caregiver support Pt reports she will have no A at home.    Co-evaluation               End of Session Equipment Utilized During Treatment: Gait belt Activity Tolerance: Patient limited by pain Patient left: in chair;with family/visitor present;with call bell/phone within reach Nurse Communication: Mobility status;Patient requests pain meds         Time: 1202-1230 PT Time Calculation (min) (ACUTE ONLY): 28 min   Charges:   PT Evaluation $Initial PT Evaluation Tier I: 1 Procedure PT Treatments $Gait Training: 8-22 mins   PT G Codes:        Emiliya Chretien LUBECK 07/31/2014, 1:24 PM

## 2014-07-31 NOTE — Discharge Instructions (Addendum)
Walk with your walker. Weight bearing as tolerated Home Health Agency will follow you at home for your therapy  You may shower with dressing on. Do not remove dressing unless it appears compromised. Shower only, no tub bath. Call if any temperatures greater than 101 or any wound complications: [phone] during the day and ask for Dr. Jeannetta EllisGioffre's nurse, Mackey Birchwoodammy Johnson.  Information on my medicine - XARELTO (Rivaroxaban)  This medication education was reviewed with me or my healthcare representative as part of my discharge preparation.  The pharmacist that spoke with me during my hospital stay was:  Reece Packerickering, Thomas Robert, [facility]  Why was Xarelto prescribed for you? Xarelto was prescribed for you to reduce the risk of blood clots forming after orthopedic surgery. The medical term for these abnormal blood clots is venous thromboembolism (VTE).  What do you need to know about xarelto ? Take your Xarelto ONCE DAILY at the same time every day. You may take it either with or without food.  If you have difficulty swallowing the tablet whole, you may crush it and mix in applesauce just prior to taking your dose.  Take Xarelto exactly as prescribed by your doctor and DO NOT stop taking Xarelto without talking to the doctor who prescribed the medication.  Stopping without other VTE prevention medication to take the place of Xarelto may increase your risk of developing a clot.  After discharge, you should have regular check-up appointments with your healthcare provider that is prescribing your Xarelto.    What do you do if you miss a dose? If you miss a dose, take it as soon as you remember on the same day then continue your regularly scheduled once daily regimen the next day. Do not take two doses of Xarelto on the same day.   Important Safety Information A possible side effect of Xarelto is bleeding. You should call your healthcare provider right away if you experience any of the  following: ? Bleeding from an injury or your nose that does not stop. ? Unusual colored urine (red or dark brown) or unusual colored stools (red or black). ? Unusual bruising for unknown reasons. ? A serious fall or if you hit your head (even if there is no bleeding).  Some medicines may interact with Xarelto and might increase your risk of bleeding while on Xarelto. To help avoid this, consult your healthcare provider or pharmacist prior to using any new prescription or non-prescription medications, including herbals, vitamins, non-steroidal anti-inflammatory drugs (NSAIDs) and supplements.  This website has more information on Xarelto: [email or web address].xarelto.[email or web address].

## 2014-07-31 NOTE — Op Note (Signed)
NAMERESHA, FILIPPONE              ACCOUNT NO.:  [id number]  MEDICAL RECORD NO.:  [id number]  LOCATION:  1536                         FACILITY:  [facility]  PHYSICIAN:  Georges Lynch. Emiko Osorto, M.D.DATE OF BIRTH:  [birth year]  DATE OF PROCEDURE:  07/30/2014 DATE OF DISCHARGE:                              OPERATIVE REPORT   SURGEON:  Georges Lynch. Darrelyn Hillock, M.D.  ASSISTANT:  Dimitri Ped, PA-C  PREOPERATIVE DIAGNOSIS:  Severe bone-on-bone primary osteoarthritis of the right knee.  POSTOPERATIVE DIAGNOSIS:  Severe bone-on-bone primary osteoarthritis of the right knee.  OPERATION:  Right total knee arthroplasty utilizing DePuy system.  The sizes used was a size 4 narrow right femoral component posterior cruciate sacrificing type.  The tibial tray was a size 3.  The insert was a size 4, 10 mm thickness.  The patella was a size 35 with 3 pegs. All 3 components were cemented with gentamicin in the cement.  DESCRIPTION OF PROCEDURE:  Under general anesthesia, routine orthopedic prep and draping of the right lower extremity was carried out.  At this particular time, the appropriate time-out was carried out.  I also marked the appropriate right leg in the holding area.  The leg was exsanguinated with Esmarch and tourniquet was elevated at 325 mmHg.  The leg then was flexed.  The foot was placed in a DeMayo knee holder. Anterior approach of the knee was carried out.  Two flaps were created. Prior to surgery, we went through the allergies and also the patient had 2 g of IV Ancef.  Following that, the incision was made over the anterior aspect of the right knee.  Bleeders were identified and cauterized.  Two flaps were created.  I then carried out a median parapatellar incision.  I reflected the patella laterally.  At this time, I did an extensive synovectomy.  She had severe synovitis.  I then did medial and lateral meniscectomies and excised the anterior and posterior cruciate ligaments.  Next,  initial drill hole was made in the intercondylar notch.  After this, the canal finer was inserted.  I thoroughly irrigated out the canal.  Following that, I then removed 12 mm thickness off the distal femur in the usual fashion.  At this time, we then measured the femur to be a size 4.  We did our anterior- posterior chamfering cuts for a size 4 right femoral component. Following that, attention was directed to the tibial plateau.  Initial drill holes in the tibial plateau.  The canal finder was inserted then thoroughly irrigated out the canal with saline.  Following that, I removed approximately 10 mm thickness off the affected medial side. Note, the knee was severely contracted.  At this time, we measured the tray to be a size 3.  Following that, we inserted our lamina spreaders, removed the posterior spurs.  We then inserted the gap instruments to measure the appropriate gaps.  Following that, we then continued to prepare the tibia.  We cut our initial keel cut in the tibia.  We then did our notch cut out of the distal femur.  We then inserted our trial components and had excellent function.  Following that, we then did a resurfacing procedure  on the patella and I measured patellar be a size 35.  We made 3 drill holes in the articular surface of the patella for a size 35 patella.  All trial components were removed.  We then thoroughly water picked out the knee, dried the knee out, uncemented all 3 components in simultaneously.  After that, we then removed all loose pieces of cement.  We checked the femur, checked the tibia with excellent fixation.  I then thoroughly water picked out the knee again. We injected 20 mL of 0.25% Marcaine plain in the soft tissue.  We then inserted a Hemovac drain, closed the knee in layers in usual fashion. The rotating platform was used in the tibial plateau that was a size 4, 10 mm thickness and the femoral component was a narrow size 4 narrow.  We  thoroughly water picked out the knee as I mentioned, closed the knee in layers in the usual fashion.  We injected 20 mL of mixture of Exparel and saline at the end of this procedure.  Sterile dressings were applied.          ______________________________ Georges Lynchonald A. Darrelyn HillockGioffre, M.D.     RAG/MEDQ  D:  07/30/2014  T:  07/31/2014  Job:  [id number]

## 2014-08-01 ENCOUNTER — Encounter (HOSPITAL_COMMUNITY): Payer: Self-pay

## 2014-08-01 LAB — CBC
HEMATOCRIT: 33.3 % — AB (ref 36.0–46.0)
Hemoglobin: 10.5 g/dL — ABNORMAL LOW (ref 12.0–15.0)
MCH: 27.4 pg (ref 26.0–34.0)
MCHC: 31.5 g/dL (ref 30.0–36.0)
MCV: 86.9 fL (ref 78.0–100.0)
Platelets: 256 10*3/uL (ref 150–400)
RBC: 3.83 MIL/uL — ABNORMAL LOW (ref 3.87–5.11)
RDW: 13.7 % (ref 11.5–15.5)
WBC: 15.8 10*3/uL — AB (ref 4.0–10.5)

## 2014-08-01 LAB — BASIC METABOLIC PANEL
Anion gap: 7 (ref 5–15)
BUN: 12 mg/dL (ref 6–23)
CALCIUM: 8.4 mg/dL (ref 8.4–10.5)
CHLORIDE: 107 mmol/L (ref 96–112)
CO2: 27 mmol/L (ref 19–32)
Creatinine, Ser: 0.74 mg/dL (ref 0.50–1.10)
GFR calc non Af Amer: >90 mL/min (ref >90)
Glucose, Bld: 132 mg/dL — ABNORMAL HIGH (ref 70–99)
POTASSIUM: 4.3 mmol/L (ref 3.5–5.1)
Sodium: 141 mmol/L (ref 135–145)

## 2014-08-01 MED ORDER — OXYCODONE HCL 5 MG PO TABS
20.0000 mg | ORAL_TABLET | ORAL | Status: DC | PRN
  Administered 2014-08-01 – 2014-08-02 (×7): 20 mg via ORAL
  Filled 2014-08-01 (×7): qty 4

## 2014-08-01 NOTE — Progress Notes (Signed)
   Subjective: 2 Days Post-Op Procedure(s) (LRB): RIGHT TOTAL KNEE ARTHROPLASTY (Right) Patient reports pain as moderate.   Patient is having issues with pain control this morning. She reports that yesterday her right leg "dropped" when she was getting in the bed. No SOB or chest pain. Still desires to go home after hospital stay.   Objective: Vital signs in last 24 hours: Temp:  [98.2 F (36.8 C)-99.7 F (37.6 C)] 99.7 F (37.6 C) (03/17 0520) Pulse Rate:  [89-105] 91 (03/17 0520) Resp:  [16-18] 18 (03/17 0520) BP: (128-146)/(68-78) 128/69 mmHg (03/17 0520) SpO2:  [95 %-99 %] 95 % (03/17 0520)  Intake/Output from previous day:  Intake/Output Summary (Last 24 hours) at 08/01/14 0747 Last data filed at 08/01/14 0520  Gross per 24 hour  Intake   2000 ml  Output   2850 ml  Net   -850 ml     Labs:  Recent Labs  07/31/14 0515 08/01/14 0540  HGB 11.5* 10.5*    Recent Labs  07/31/14 0515 08/01/14 0540  WBC 16.2* 15.8*  RBC 4.30 3.83*  HCT 37.2 33.3*  PLT 342 256    Recent Labs  07/31/14 0515 08/01/14 0540  NA 142 141  K 4.1 4.3  CL 105 107  CO2 24 27  BUN 8 12  CREATININE 0.89 0.74  GLUCOSE 224* 132*  CALCIUM 8.9 8.4    EXAM General - Patient is Alert and Oriented Extremity - Neurologically intact Intact pulses distally Dorsiflexion/Plantar flexion intact Dressing/Incision - clean, dry, no drainage Motor Function - intact, moving foot and toes well on exam.   Past Medical History  Diagnosis Date  . Depression   . Hx of pneumothorax     X2 IN PAST  . Headache     MIGRAINES  . Numbness     MULTIPLE AREAS - "from all the surgeries I've had"  . Arthritis     multiple areas  . Neuropathy   . Fibromyalgia   . Bone marrow disease     DX many years ago - pt unsure of name and unable to find info on type of disease  . Kidney disease     only has one functioning kidney - no problems  x 23 yrs  . Anemia   . H/O iron deficiency anemia   . Anxiety    . Liver failure, acute 5 years ago    cause unknown, after hysterectomy  . Complication of anesthesia 2011    slow to awaken after hysterectomy  . PONV (postoperative nausea and vomiting)     Assessment/Plan: 2 Days Post-Op Procedure(s) (LRB): RIGHT TOTAL KNEE ARTHROPLASTY (Right) Active Problems:   History of total knee arthroplasty  Estimated body mass index is 34.95 kg/(m^2) as calculated from the following:   Height as of this encounter: 5\' 5"  (1.651 m).   Weight as of this encounter: 95.255 kg (210 lb). Advance diet Up with therapy D/C IV fluids  DVT Prophylaxis - Xarelto Weight-Bearing as tolerated  She is having some increased issues with pain. Will continue to monitor. Will continue therapy today. If she improves today we will get her home with home health this afternoon. If she does not improve we will keep her until tomorrow.   Dimitri PedAmber Cleaster Shiffer, PA-C Orthopaedic Surgery 08/01/2014, 7:47 AM

## 2014-08-01 NOTE — Progress Notes (Signed)
Physical Therapy Treatment Patient Details Name: [name] MRN: [id number] DOB: Jan 31, 1961 Today's Date: 08/01/2014    History of Present Illness s/p R TKA due to arthritis.  Pt reports hx of multiple shoulder, back, neck surgeries.    PT Comments    POD # 2 am session.  Pt reports she had a bad night.  Her leg was "dropped" when assisted to bed/BR, was not given ICE for her knee even when she asked and "they didn't put on my brace" even when I told them I needed it to get up.   Pt also have issues of pain control.  Pt has had multiple surgies and Hx pain med use.  "whatever they are giving me is not working".   Applied KI and assited OOB to amb in hallway with increased time and x 2 standing rest breaks. Great difficulty tolerating full WBing.  Unsteady gait. Returned to room then performed some of her TKR TE's mostly AAROM to tolerance.  AAROM knee flex approx only 50 degrees.  Completed session with apply gel pack ICE to R knee.   Follow Up Recommendations  Home health PT ([facility] however declines "they said at Dr Jeannetta EllisGioffre's office I would be going home".  )     Equipment Recommendations  Rolling walker with 5" wheels    Recommendations for Other Services       Precautions / Restrictions Precautions Precautions: Fall Precaution Comments: instructed pt on KI use for amb until able to perform active SLR Required Braces or Orthoses: Knee Immobilizer - Right Knee Immobilizer - Right: Discontinue once straight leg raise with < 10 degree lag Restrictions Weight Bearing Restrictions: No RLE Weight Bearing: Weight bearing as tolerated    Mobility  Bed Mobility Overal bed mobility: Needs Assistance Bed Mobility: Supine to Sit     Supine to sit: Mod assist;Min assist     General bed mobility comments: assist with R LE and increased time  Transfers Overall transfer level: Needs assistance Equipment used: Rolling walker (2  wheeled) Transfers: Sit to/from Stand Sit to Stand: Mod assist;Min assist         General transfer comment: 25% VC's on proper tech, hand placement and R LE advancement.   Ambulation/Gait Ambulation/Gait assistance: Min assist Ambulation Distance (Feet): 52 Feet Assistive device: Rolling walker (2 wheeled) Gait Pattern/deviations: Step-to pattern;Trunk flexed Gait velocity: decreased   General Gait Details: limited amb distance and decrease WB tolerance due to pain level. pt in tears but very willing to try.  x 2 standing rest breaks and excessive WBing B UE's thru walker which causes increased stress on her back and shoulders.   Stairs            Wheelchair Mobility    Modified Rankin (Stroke Patients Only)       Balance                                    Cognition Arousal/Alertness: Awake/alert Behavior During Therapy: WFL for tasks assessed/performed Overall Cognitive Status: Within Functional Limits for tasks assessed                      Exercises      General Comments        Pertinent Vitals/Pain Pain Assessment: 0-10 Pain Score: 10-Worst pain ever Pain Location: R knee down to ankle Pain  Descriptors / Indicators: Sore;Tender;Tightness;Crying Pain Intervention(s): Monitored during session;Repositioned;Ice applied    Home Living                      Prior Function            PT Goals (current goals can now be found in the care plan section) Progress towards PT goals: Progressing toward goals    Frequency  7X/week    PT Plan      Co-evaluation             End of Session Equipment Utilized During Treatment: Gait belt Activity Tolerance: Patient limited by pain Patient left: in chair;with call bell/phone within reach     Time: 0900-0940 PT Time Calculation (min) (ACUTE ONLY): 40 min  Charges:  $Gait Training: 8-22 mins $Therapeutic Exercise: 8-22 mins $Therapeutic Activity: 8-22 mins                     G Codes:      Felecia Shelling  PTA [facility]  Acute  Rehab Pager      [phone]

## 2014-08-01 NOTE — Progress Notes (Signed)
Occupational Therapy Treatment Patient Details Name: [name] MRN: [id number] DOB: [birth year] Today's Date: 08/01/2014    History of present illness s/p R TKA due to arthritis.  Pt reports hx of multiple shoulder, back, neck surgeries.   OT comments  Pt with increased participation this day- although limited by pain.  Feel pt a high fall risk due to pain and history of falls.  Follow Up Recommendations  SNF          Precautions / Restrictions Precautions Precautions: Fall Precaution Comments: pt reports frequent falls with 2 falls last week Required Braces or Orthoses: Knee Immobilizer - Right Restrictions Weight Bearing Restrictions: No RLE Weight Bearing: Weight bearing as tolerated       Mobility Bed Mobility               General bed mobility comments: pt in chair  Transfers Overall transfer level: Needs assistance Equipment used: Rolling walker (2 wheeled) Transfers: Sit to/from Stand Sit to Stand: Mod assist         General transfer comment: VC for hand placement and safety        ADL Overall ADL's : Needs assistance/impaired     Grooming: Sitting;Set up   Upper Body Bathing: Minimal assitance;Sitting   Lower Body Bathing: Maximal assistance;Sit to/from stand   Upper Body Dressing : Minimal assistance;Sitting   Lower Body Dressing: Maximal assistance;Sit to/from stand                 General ADL Comments: limited by pain.  Pt shaking beginning of OT session. RN gave meds (IV) and shaking subsided.  Pt will need A at home with ADL activity .                  Cognition   Behavior During Therapy: WFL for tasks assessed/performed Overall Cognitive Status: Within Functional Limits for tasks assessed                               General Comments      Pertinent Vitals/ Pain       Pain Score: 8  Pain Descriptors / Indicators: Sore Pain Intervention(s): Limited activity within patient's tolerance;Monitored  during session         Frequency Min 2X/week     Progress Toward Goals  OT Goals(current goals can now be found in the care plan section)  Progress towards OT goals: Progressing toward goals     Plan Discharge plan remains appropriate    Co-evaluation                 End of Session     Activity Tolerance Patient limited by pain   Patient Left in chair;with call bell/phone within reach   Nurse Communication Mobility status        Time: [id number] OT Time Calculation (min): 33 min  Charges: OT General Charges $OT Visit: 1 Procedure OT Treatments $Self Care/Home Management : 23-37 mins  Delano Scardino D 08/01/2014, 11:08 AM

## 2014-08-01 NOTE — Progress Notes (Signed)
Physical Therapy Treatment Patient Details Name: [name] MRN: [id number] DOB: 02-Nov-1960 Today's Date: 08/01/2014    History of Present Illness s/p R TKA due to arthritis.  Pt reports hx of multiple shoulder, back, neck surgeries.    PT Comments    POD # 2 pm session. Pt still having trouble controlling her pain.  Very willing to do her therapy.  Applied KI and assisted OOB to amb in hallway.  Returned to bed and performed remaining TKR TE's we didn't get to finish from this am due to pain.   Follow Up Recommendations  Home health PT (pt [facility] however declines "they said at Dr Serita GritGiofree's office I would be going home".  )     Equipment Recommendations  Rolling walker with 5" wheels    Recommendations for Other Services       Precautions / Restrictions Precautions Precautions: Fall Precaution Comments: instructed pt on KI use for amb until able to perform active SLR Required Braces or Orthoses: Knee Immobilizer - Right Knee Immobilizer - Right: Discontinue once straight leg raise with < 10 degree lag Restrictions Weight Bearing Restrictions: No RLE Weight Bearing: Weight bearing as tolerated    Mobility  Bed Mobility Overal bed mobility: Needs Assistance Bed Mobility: Sit to Supine;Supine to Sit     Supine to sit: Min assist Sit to supine: Min assist   General bed mobility comments: assist with R LE and increased time  Transfers Overall transfer level: Needs assistance Equipment used: Rolling walker (2 wheeled) Transfers: Sit to/from Stand Sit to Stand: Mod assist;Min assist         General transfer comment: 25% VC's on proper tech, hand placement and R LE advancement.   Ambulation/Gait Ambulation/Gait assistance: Min assist Ambulation Distance (Feet): 60 Feet Assistive device: Rolling walker (2 wheeled) Gait Pattern/deviations: Step-to pattern;Trunk flexed Gait velocity: decreased   General Gait Details: still  unsteady gait mainly due to pain.  Pt very willing to try.  "I want to get better".    Stairs            Wheelchair Mobility    Modified Rankin (Stroke Patients Only)       Balance                                    Cognition Arousal/Alertness: Awake/alert Behavior During Therapy: WFL for tasks assessed/performed Overall Cognitive Status: Within Functional Limits for tasks assessed                      Exercises   Total Knee Replacement TE's 10 reps B LE ankle pumps 10 reps heel slides  10 reps SAQ's 10 reps SLR's Followed by ICE     General Comments        Pertinent Vitals/Pain Pain Assessment: 0-10 Pain Score: 10-Worst pain ever Pain Location: R knee down to ankle Pain Descriptors / Indicators: Sore;Tender;Tightness;Crying Pain Intervention(s): Monitored during session;Repositioned;Ice applied    Home Living                      Prior Function            PT Goals (current goals can now be found in the care plan section) Progress towards PT goals: Progressing toward goals    Frequency  7X/week    PT Plan  Co-evaluation             End of Session Equipment Utilized During Treatment: Gait belt Activity Tolerance: Patient limited by pain Patient left: in bed;with call bell/phone within reach     Time: 1415-1500 PT Time Calculation (min) (ACUTE ONLY): 45 min  Charges:  $Gait Training: 8-22 mins $Therapeutic Exercise: 8-22 mins $Therapeutic Activity: 8-22 mins                    G Codes:      Felecia Shelling  PTA [facility]  Acute  Rehab Pager      [phone]

## 2014-08-02 LAB — CBC
HEMATOCRIT: 28.8 % — AB (ref 36.0–46.0)
Hemoglobin: 9 g/dL — ABNORMAL LOW (ref 12.0–15.0)
MCH: 27.4 pg (ref 26.0–34.0)
MCHC: 31.3 g/dL (ref 30.0–36.0)
MCV: 87.5 fL (ref 78.0–100.0)
Platelets: 246 10*3/uL (ref 150–400)
RBC: 3.29 MIL/uL — AB (ref 3.87–5.11)
RDW: 14 % (ref 11.5–15.5)
WBC: 12.1 10*3/uL — ABNORMAL HIGH (ref 4.0–10.5)

## 2014-08-02 MED ORDER — OXYCODONE HCL 20 MG PO TABS: 20.0000 mg | ORAL_TABLET | ORAL | Status: DC | PRN

## 2014-08-02 NOTE — Progress Notes (Signed)
Physical Therapy Treatment Patient Details Name: [name] MRN: [id number] DOB: 11-20-1960 Today's Date: 08/02/2014    History of Present Illness s/p R TKA due to arthritis.  Pt reports hx of multiple shoulder, back, neck surgeries.    PT Comments    POD # 3 pt had a much better night and reports "they changed my pain meds and that's working for me".  Pt still requires use of KI as she is unable to self perform SLR.  Applied KI and assisted OOB to amb a greater distance in hallway.  Increased steadiness and decrease c/o pain compared to yesterday.  Returned to room and performed all TKR TE's following HEP handout.  Extra time was needed to educate and perform.  Pt requested to go back to bed to rest and ICE before D/C to home.    Follow Up Recommendations  Home health PT     Equipment Recommendations  Rolling walker with 5" wheels    Recommendations for Other Services       Precautions / Restrictions Precautions Precautions: Fall Precaution Comments: instructed pt on KI use for amb until able to perform active SLR Required Braces or Orthoses: Knee Immobilizer - Right Restrictions Weight Bearing Restrictions: No RLE Weight Bearing: Weight bearing as tolerated    Mobility  Bed Mobility Overal bed mobility: Needs Assistance Bed Mobility: Supine to Sit;Sit to Supine     Supine to sit: Min assist Sit to supine: Min assist   General bed mobility comments: assist with R LE and increased time  Transfers Overall transfer level: Needs assistance Equipment used: Rolling walker (2 wheeled) Transfers: Sit to/from Stand Sit to Stand: Supervision;Min guard         General transfer comment: good tech and use of hands  Ambulation/Gait Ambulation/Gait assistance: Supervision;Min guard Ambulation Distance (Feet): 145 Feet Assistive device: Rolling walker (2 wheeled) Gait Pattern/deviations: Step-to pattern;Decreased stance time - right Gait velocity: decreased    General Gait Details: tolerated increased distance and better pain control.     Stairs Stairs:  (ramp at home)          Wheelchair Mobility    Modified Rankin (Stroke Patients Only)       Balance                                    Cognition                            Exercises   Total Knee Replacement TE's 10 reps B LE ankle pumps 10 reps towel squeezes 10 reps knee presses 10 reps heel slides  10 reps SAQ's 10 reps SLR's 10 reps ABD Followed by ICE     General Comments        Pertinent Vitals/Pain Pain Assessment: 0-10 Pain Score: 5  Pain Location: R knee "much better, they changed my pain meds" Pain Descriptors / Indicators: Sore Pain Intervention(s): Monitored during session;Premedicated before session;Repositioned;Ice applied    Home Living                      Prior Function            PT Goals (current goals can now be found in the care plan section) Progress towards PT goals: Progressing toward goals    Frequency  7X/week    PT Plan  Co-evaluation             End of Session Equipment Utilized During Treatment: Gait belt Activity Tolerance: Patient tolerated treatment well Patient left: with call bell/phone within reach     Time: 0948-1050 PT Time Calculation (min) (ACUTE ONLY): 62 min  Charges:  $Gait Training: 8-22 mins $Therapeutic Exercise: 23-37 mins $Therapeutic Activity: 8-22 mins                    G Codes:      Felecia Shelling  PTA [facility]  Acute  Rehab Pager      [phone]

## 2014-08-02 NOTE — Progress Notes (Signed)
Subjective: 3 Days Post-Op Procedure(s) (LRB): RIGHT TOTAL KNEE ARTHROPLASTY (Right) Patient reports pain as mild.   Patient is doing much better this morning. She reports that her pain is under much better control since the switch back to Oxy. No issues overnight. No SOB or chest pain. She reports concern about not being about to perform a SLR yet.    Objective: Vital signs in last 24 hours: Temp:  [98.1 F (36.7 C)-100.4 F (38 C)] 100.4 F (38 C) (03/18 0228) Pulse Rate:  [92-120] 100 (03/18 0228) Resp:  [18-20] 18 (03/18 0400) BP: (127-139)/(74-100) 135/74 mmHg (03/18 0228) SpO2:  [92 %-98 %] 98 % (03/18 0400)  Intake/Output from previous day:  Intake/Output Summary (Last 24 hours) at 08/02/14 0739 Last data filed at 08/02/14 0600  Gross per 24 hour  Intake   2320 ml  Output   2200 ml  Net    120 ml     Labs:  Recent Labs  07/31/14 0515 08/01/14 0540 08/02/14 0520  HGB 11.5* 10.5* 9.0*    Recent Labs  08/01/14 0540 08/02/14 0520  WBC 15.8* 12.1*  RBC 3.83* 3.29*  HCT 33.3* 28.8*  PLT 256 246    Recent Labs  07/31/14 0515 08/01/14 0540  NA 142 141  K 4.1 4.3  CL 105 107  CO2 24 27  BUN 8 12  CREATININE 0.89 0.74  GLUCOSE 224* 132*  CALCIUM 8.9 8.4    EXAM General - Patient is Alert and Oriented Extremity - Neurologically intact Intact pulses distally Dorsiflexion/Plantar flexion intact Compartment soft Dressing/Incision - clean, dry, no drainage Motor Function - intact, moving foot and toes well on exam.  Mild erythema around the medial midportion of the knee, related to hematoma not infection  Past Medical History  Diagnosis Date  . Depression   . Hx of pneumothorax     X2 IN PAST  . Headache     MIGRAINES  . Numbness     MULTIPLE AREAS - "from all the surgeries I've had"  . Arthritis     multiple areas  . Neuropathy   . Fibromyalgia   . Bone marrow disease     DX many years ago - pt unsure of name and unable to find info  on type of disease  . Kidney disease     only has one functioning kidney - no problems  x 23 yrs  . Anemia   . H/O iron deficiency anemia   . Anxiety   . Liver failure, acute 5 years ago    cause unknown, after hysterectomy  . Complication of anesthesia 2011    slow to awaken after hysterectomy  . PONV (postoperative nausea and vomiting)     Assessment/Plan: 3 Days Post-Op Procedure(s) (LRB): RIGHT TOTAL KNEE ARTHROPLASTY (Right) Active Problems:   History of total knee arthroplasty Postoperative acute blood loss anemia  Estimated body mass index is 34.95 kg/(m^2) as calculated from the following:   Height as of this encounter: 5\' 5"  (1.651 m).   Weight as of this encounter: 95.255 kg (210 lb). Advance diet Up with therapy D/C IV fluids Discharge home with home health  DVT Prophylaxis - Xarelto Weight-Bearing as tolerated  She is doing much better. Discussed with her that with continuation of PT she will regain the ability to perform a SLR. No concern for infection at this time. Erythema related to hematoma. Will continue PT today and prepare for DC home today with CNA as well as home  therapy. Discharge instructions discussed with the patient.  Dimitri Ped, PA-C Orthopaedic Surgery 08/02/2014, 7:39 AM

## 2014-08-05 NOTE — Discharge Summary (Signed)
Physician Discharge Summary   Patient ID: [name] MRN: [id number] DOB/AGE: 53-Aug-1962 53 y.o.  Admit date: 07/30/2014 Discharge date: 08/02/2014  Primary Diagnosis: Primary osteoarthritis, right knee   Admission Diagnoses:  Past Medical History  Diagnosis Date  . Depression   . Hx of pneumothorax     X2 IN PAST  . Headache     MIGRAINES  . Numbness     MULTIPLE AREAS - "from all the surgeries I've had"  . Arthritis     multiple areas  . Neuropathy   . Fibromyalgia   . Bone marrow disease     DX many years ago - pt unsure of name and unable to find info on type of disease  . Kidney disease     only has one functioning kidney - no problems  x 23 yrs  . Anemia   . H/O iron deficiency anemia   . Anxiety   . Liver failure, acute 5 years ago    cause unknown, after hysterectomy  . Complication of anesthesia 2011    slow to awaken after hysterectomy  . PONV (postoperative nausea and vomiting)    Discharge Diagnoses:   Active Problems:   History of total knee arthroplasty  Estimated body mass index is 34.95 kg/(m^2) as calculated from the following:   Height as of this encounter: '5\' 5"'  (1.651 m).   Weight as of this encounter: 95.255 kg (210 lb).  Procedure:  Procedure(s) (LRB): RIGHT TOTAL KNEE ARTHROPLASTY (Right)   Consults: None  HPI: [name], 54 y.o. female, has a history of pain and functional disability in the right knee due to arthritis and has failed non-surgical conservative treatments for greater than 12 weeks to includeNSAID's and/or analgesics, corticosteriod injections, use of assistive devices and activity modification. Onset of symptoms was gradual, starting 8 years ago with gradually worsening course since that time. The patient noted prior procedures on the knee to include arthroscopy and menisectomy on the right knee(s). Patient currently rates pain in the right knee(s) at 8 out of 10 with activity. Patient has night pain, worsening of  pain with activity and weight bearing, pain that interferes with activities of daily living, pain with passive range of motion, crepitus and joint swelling. Patient has evidence of periarticular osteophytes and joint space narrowing by imaging studies. There is no active infection.  Laboratory Data: Admission on 07/30/2014, Discharged on 08/02/2014  Component Date Value Ref Range Status  . WBC 07/31/2014 16.2* 4.0 - 10.5 K/uL Final  . RBC 07/31/2014 4.30  3.87 - 5.11 MIL/uL Final  . Hemoglobin 07/31/2014 11.5* 12.0 - 15.0 g/dL Final  . HCT 07/31/2014 37.2  36.0 - 46.0 % Final  . MCV 07/31/2014 86.5  78.0 - 100.0 fL Final  . MCH 07/31/2014 26.7  26.0 - 34.0 pg Final  . MCHC 07/31/2014 30.9  30.0 - 36.0 g/dL Final  . RDW 07/31/2014 13.6  11.5 - 15.5 % Final  . Platelets 07/31/2014 342  150 - 400 K/uL Final  . Sodium 07/31/2014 142  135 - 145 mmol/L Final  . Potassium 07/31/2014 4.1  3.5 - 5.1 mmol/L Final  . Chloride 07/31/2014 105  96 - 112 mmol/L Final  . CO2 07/31/2014 24  19 - 32 mmol/L Final  . Glucose, Bld 07/31/2014 224* 70 - 99 mg/dL Final  . BUN 07/31/2014 8  6 - 23 mg/dL Final  . Creatinine, Ser 07/31/2014 0.89  0.50 - 1.10 mg/dL Final  . Calcium 07/31/2014  8.9  8.4 - 10.5 mg/dL Final  . GFR calc non Af Amer 07/31/2014 73* >90 mL/min Final  . GFR calc Af Amer 07/31/2014 85* >90 mL/min Final   Comment: (NOTE) The eGFR has been calculated using the CKD EPI equation. This calculation has not been validated in all clinical situations. eGFR's persistently <90 mL/min signify possible Chronic Kidney Disease.   . Anion gap 07/31/2014 13  5 - 15 Final  . WBC 08/01/2014 15.8* 4.0 - 10.5 K/uL Final  . RBC 08/01/2014 3.83* 3.87 - 5.11 MIL/uL Final  . Hemoglobin 08/01/2014 10.5* 12.0 - 15.0 g/dL Final  . HCT 08/01/2014 33.3* 36.0 - 46.0 % Final  . MCV 08/01/2014 86.9  78.0 - 100.0 fL Final  . MCH 08/01/2014 27.4  26.0 - 34.0 pg Final  . MCHC 08/01/2014 31.5  30.0 - 36.0 g/dL Final    . RDW 08/01/2014 13.7  11.5 - 15.5 % Final  . Platelets 08/01/2014 256  150 - 400 K/uL Final   Comment: DELTA CHECK NOTED REPEATED TO VERIFY SPECIMEN CHECKED FOR CLOTS   . Sodium 08/01/2014 141  135 - 145 mmol/L Final  . Potassium 08/01/2014 4.3  3.5 - 5.1 mmol/L Final  . Chloride 08/01/2014 107  96 - 112 mmol/L Final  . CO2 08/01/2014 27  19 - 32 mmol/L Final  . Glucose, Bld 08/01/2014 132* 70 - 99 mg/dL Final  . BUN 08/01/2014 12  6 - 23 mg/dL Final  . Creatinine, Ser 08/01/2014 0.74  0.50 - 1.10 mg/dL Final  . Calcium 08/01/2014 8.4  8.4 - 10.5 mg/dL Final  . GFR calc non Af Amer 08/01/2014 >90  >90 mL/min Final  . GFR calc Af Amer 08/01/2014 >90  >90 mL/min Final   Comment: (NOTE) The eGFR has been calculated using the CKD EPI equation. This calculation has not been validated in all clinical situations. eGFR's persistently <90 mL/min signify possible Chronic Kidney Disease.   . Anion gap 08/01/2014 7  5 - 15 Final  . WBC 08/02/2014 12.1* 4.0 - 10.5 K/uL Final  . RBC 08/02/2014 3.29* 3.87 - 5.11 MIL/uL Final  . Hemoglobin 08/02/2014 9.0* 12.0 - 15.0 g/dL Final  . HCT 08/02/2014 28.8* 36.0 - 46.0 % Final  . MCV 08/02/2014 87.5  78.0 - 100.0 fL Final  . MCH 08/02/2014 27.4  26.0 - 34.0 pg Final  . MCHC 08/02/2014 31.3  30.0 - 36.0 g/dL Final  . RDW 08/02/2014 14.0  11.5 - 15.5 % Final  . Platelets 08/02/2014 246  150 - 400 K/uL Final  Hospital Outpatient Visit on 07/25/2014  Component Date Value Ref Range Status  . WBC 07/25/2014 8.5  4.0 - 10.5 K/uL Final  . RBC 07/25/2014 4.69  3.87 - 5.11 MIL/uL Final  . Hemoglobin 07/25/2014 12.6  12.0 - 15.0 g/dL Final  . HCT 07/25/2014 40.4  36.0 - 46.0 % Final  . MCV 07/25/2014 86.1  78.0 - 100.0 fL Final  . MCH 07/25/2014 26.9  26.0 - 34.0 pg Final  . MCHC 07/25/2014 31.2  30.0 - 36.0 g/dL Final  . RDW 07/25/2014 13.3  11.5 - 15.5 % Final  . Platelets 07/25/2014 349  150 - 400 K/uL Final  . ABO/RH(D) 07/25/2014 A POS   Final  .  Antibody Screen 07/25/2014 NEG   Final  . Sample Expiration 07/25/2014 08/02/2014   Final  . Sodium 07/25/2014 140  135 - 145 mmol/L Final  . Potassium 07/25/2014 3.8  3.5 - 5.1  mmol/L Final  . Chloride 07/25/2014 107  96 - 112 mmol/L Final  . CO2 07/25/2014 28  19 - 32 mmol/L Final  . Glucose, Bld 07/25/2014 89  70 - 99 mg/dL Final  . BUN 07/25/2014 13  6 - 23 mg/dL Final  . Creatinine, Ser 07/25/2014 0.75  0.50 - 1.10 mg/dL Final  . Calcium 07/25/2014 9.1  8.4 - 10.5 mg/dL Final  . Total Protein 07/25/2014 7.9  6.0 - 8.3 g/dL Final  . Albumin 07/25/2014 4.3  3.5 - 5.2 g/dL Final  . AST 07/25/2014 16  0 - 37 U/L Final  . ALT 07/25/2014 11  0 - 35 U/L Final  . Alkaline Phosphatase 07/25/2014 126* 39 - 117 U/L Final  . Total Bilirubin 07/25/2014 0.7  0.3 - 1.2 mg/dL Final  . GFR calc non Af Amer 07/25/2014 >90  >90 mL/min Final  . GFR calc Af Amer 07/25/2014 >90  >90 mL/min Final   Comment: (NOTE) The eGFR has been calculated using the CKD EPI equation. This calculation has not been validated in all clinical situations. eGFR's persistently <90 mL/min signify possible Chronic Kidney Disease.   . Anion gap 07/25/2014 5  5 - 15 Final  . aPTT 07/25/2014 35  24 - 37 seconds Final  . Prothrombin Time 07/25/2014 14.7  11.6 - 15.2 seconds Final  . INR 07/25/2014 1.14  0.00 - 1.49 Final  . MRSA, PCR 07/25/2014 NEGATIVE  NEGATIVE Final  . Staphylococcus aureus 07/25/2014 NEGATIVE  NEGATIVE Final   Comment:        The Xpert SA Assay (FDA approved for NASAL specimens in patients over 66 years of age), is one component of a comprehensive surveillance program.  Test performance has been validated by [facility] for patients greater than or equal to 29 year old. It is not intended to diagnose infection nor to guide or monitor treatment. Performed at [facility]   . ABO/RH(D) 07/25/2014 A POS   Final     X-Rays:No results found.  EKG: Orders placed or performed during the  hospital encounter of 07/25/14  . EKG 12-Lead  . EKG 12-Lead     Hospital Course: COLLIE KITTEL is a 54 y.o. who was admitted to [facility]. They were brought to the operating room on 07/30/2014 and underwent Procedure(s): RIGHT TOTAL KNEE ARTHROPLASTY.  Patient tolerated the procedure well and was later transferred to the recovery room and then to the orthopaedic floor for postoperative care.  They were given PO and IV analgesics for pain control following their surgery.  They were given 24 hours of postoperative antibiotics of  Anti-infectives    Start     Dose/Rate Route Frequency Ordered Stop   07/30/14 2000  ceFAZolin (ANCEF) IVPB 1 g/50 mL premix     1 g 100 mL/hr over 30 Minutes Intravenous Every 6 hours 07/30/14 1714 07/31/14 0606   07/30/14 1339  polymyxin B 500,000 Units, bacitracin 50,000 Units in sodium chloride irrigation 0.9 % 500 mL irrigation  Status:  Discontinued       As needed 07/30/14 1339 07/30/14 1533   07/30/14 1045  ceFAZolin (ANCEF) IVPB 2 g/50 mL premix     2 g 100 mL/hr over 30 Minutes Intravenous On call to O.R. 07/30/14 1030 07/30/14 1304     and started on DVT prophylaxis in the form of Xarelto.   PT and OT were ordered for total joint protocol.  Discharge planning consulted to help with postop disposition  and equipment needs.  Patient had a difficult night on the evening of surgery due to nausea and pain.  They started to get up OOB with therapy on day one. Hemovac drain was pulled without difficulty.  Continued to work with therapy into day two and started to progress but continued to have poor pain control. Pain medications changed to Oxycodone and patient responded positively. By day three, the patient had progressed with therapy and meeting their goals.  Incision was healing well.  Patient was seen in rounds and was ready to go home with CNA and home health.   Diet: Regular diet Activity:WBAT Follow-up:in 2 weeks Disposition - Home Discharged  Condition: stable   Discharge Instructions    Call MD / Call 911    Complete by:  As directed   If you experience chest pain or shortness of breath, CALL 911 and be transported to the hospital emergency room.  If you develope a fever above 101 F, pus (white drainage) or increased drainage or redness at the wound, or calf pain, call your surgeon's office.     Constipation Prevention    Complete by:  As directed   Drink plenty of fluids.  Prune juice may be helpful.  You may use a stool softener, such as Colace (over the counter) 100 mg twice a day.  Use MiraLax (over the counter) for constipation as needed.     Diet general    Complete by:  As directed      Discharge instructions    Complete by:  As directed   Walk with your walker. Weight bearing as tolerated [place] will follow you at home for your therapy  You may shower with dressing on. Do not remove dressing unless it appears compromised. Shower only, no tub bath. Call if any temperatures greater than 101 or any wound complications: [phone] during the day and ask for Dr. Charlestine Night nurse, Brunilda Payor.     Do not put a pillow under the knee. Place it under the heel.    Complete by:  As directed      Driving restrictions    Complete by:  As directed   No driving     Increase activity slowly as tolerated    Complete by:  As directed             Medication List    STOP taking these medications        oxyCODONE-acetaminophen 10-325 MG per tablet  Commonly known as:  PERCOCET      TAKE these medications        albuterol 8 MG 12 hr tablet  Commonly known as:  VOSPIRE ER  Take 2 tablets by mouth 2 (two) times daily.     buPROPion 150 MG 24 hr tablet  Commonly known as:  WELLBUTRIN XL  Take 150 mg by mouth every morning.     diazepam 5 MG tablet  Commonly known as:  VALIUM  Take 5 mg by mouth every 8 (eight) hours as needed for anxiety.     ferrous sulfate 325 (65 FE) MG tablet  Take 1 tablet (325 mg  total) by mouth 3 (three) times daily after meals.     FLUoxetine 40 MG capsule  Commonly known as:  PROZAC  Take 80 mg by mouth every morning. 2     hydrOXYzine 50 MG tablet  Commonly known as:  ATARAX/VISTARIL  Take 100 mg by mouth 2 (two) times daily as needed for  anxiety.     methocarbamol 500 MG tablet  Commonly known as:  ROBAXIN  Take 1 tablet (500 mg total) by mouth every 6 (six) hours as needed for muscle spasms.     ondansetron 8 MG disintegrating tablet  Commonly known as:  ZOFRAN-ODT  Take 8 mg by mouth every 8 (eight) hours as needed for nausea.     Oxycodone HCl 20 MG Tabs  Take 40 mg by mouth every 6 (six) hours as needed (pain.).     Oxycodone HCl 20 MG Tabs  Take 1 tablet (20 mg total) by mouth every 3 (three) hours as needed for severe pain.     pregabalin 225 MG capsule  Commonly known as:  LYRICA  Take 225 mg by mouth 2 (two) times daily.     PRESCRIPTION MEDICATION  Place 2 drops into both eyes every 4 (four) hours as needed (pink eye.).     rivaroxaban 10 MG Tabs tablet  Commonly known as:  XARELTO  Take 1 tablet (10 mg total) by mouth daily with breakfast.     rOPINIRole 2 MG tablet  Commonly known as:  REQUIP  Take 1 mg by mouth at bedtime.           Follow-up Information    Follow up with GIOFFRE,RONALD A, MD. Schedule an appointment as soon as possible for a visit in 2 weeks.   Specialty:  Orthopedic Surgery   Contact information:   [address] [place] [address] [phone]       Signed: Ardeen Jourdain, PA-C Orthopaedic Surgery 08/05/2014, 10:56 AM

## 2014-12-07 ENCOUNTER — Emergency Department (HOSPITAL_COMMUNITY)
Admission: EM | Admit: 2014-12-07 | Discharge: 2014-12-07 | Disposition: A | Discharge status: Home / Self Care | Payer: Medicare Other | Attending: Emergency Medicine | Admitting: Emergency Medicine

## 2014-12-07 ENCOUNTER — Emergency Department (HOSPITAL_COMMUNITY): Payer: Medicare Other

## 2014-12-07 ENCOUNTER — Encounter (HOSPITAL_COMMUNITY): Payer: Self-pay | Admitting: Emergency Medicine

## 2014-12-07 DIAGNOSIS — Z8719 Personal history of other diseases of the digestive system: Secondary | ICD-10-CM | POA: Diagnosis not present

## 2014-12-07 DIAGNOSIS — G629 Polyneuropathy, unspecified: Secondary | ICD-10-CM | POA: Insufficient documentation

## 2014-12-07 DIAGNOSIS — Z79899 Other long term (current) drug therapy: Secondary | ICD-10-CM | POA: Insufficient documentation

## 2014-12-07 DIAGNOSIS — F419 Anxiety disorder, unspecified: Secondary | ICD-10-CM | POA: Insufficient documentation

## 2014-12-07 DIAGNOSIS — S8002XA Contusion of left knee, initial encounter: Secondary | ICD-10-CM | POA: Insufficient documentation

## 2014-12-07 DIAGNOSIS — M797 Fibromyalgia: Secondary | ICD-10-CM | POA: Insufficient documentation

## 2014-12-07 DIAGNOSIS — Y9389 Activity, other specified: Secondary | ICD-10-CM | POA: Diagnosis not present

## 2014-12-07 DIAGNOSIS — Y9289 Other specified places as the place of occurrence of the external cause: Secondary | ICD-10-CM | POA: Insufficient documentation

## 2014-12-07 DIAGNOSIS — M1990 Unspecified osteoarthritis, unspecified site: Secondary | ICD-10-CM | POA: Diagnosis not present

## 2014-12-07 DIAGNOSIS — S8001XA Contusion of right knee, initial encounter: Secondary | ICD-10-CM | POA: Insufficient documentation

## 2014-12-07 DIAGNOSIS — Z87448 Personal history of other diseases of urinary system: Secondary | ICD-10-CM | POA: Diagnosis not present

## 2014-12-07 DIAGNOSIS — G8929 Other chronic pain: Secondary | ICD-10-CM | POA: Diagnosis not present

## 2014-12-07 DIAGNOSIS — F329 Major depressive disorder, single episode, unspecified: Secondary | ICD-10-CM | POA: Insufficient documentation

## 2014-12-07 DIAGNOSIS — Z8709 Personal history of other diseases of the respiratory system: Secondary | ICD-10-CM | POA: Diagnosis not present

## 2014-12-07 DIAGNOSIS — R200 Anesthesia of skin: Secondary | ICD-10-CM | POA: Diagnosis not present

## 2014-12-07 DIAGNOSIS — G43909 Migraine, unspecified, not intractable, without status migrainosus: Secondary | ICD-10-CM | POA: Insufficient documentation

## 2014-12-07 DIAGNOSIS — W010XXA Fall on same level from slipping, tripping and stumbling without subsequent striking against object, initial encounter: Secondary | ICD-10-CM | POA: Insufficient documentation

## 2014-12-07 DIAGNOSIS — Y998 Other external cause status: Secondary | ICD-10-CM | POA: Diagnosis not present

## 2014-12-07 DIAGNOSIS — Z96651 Presence of right artificial knee joint: Secondary | ICD-10-CM | POA: Insufficient documentation

## 2014-12-07 DIAGNOSIS — D509 Iron deficiency anemia, unspecified: Secondary | ICD-10-CM | POA: Insufficient documentation

## 2014-12-07 DIAGNOSIS — S8991XA Unspecified injury of right lower leg, initial encounter: Secondary | ICD-10-CM | POA: Diagnosis present

## 2014-12-07 DIAGNOSIS — S8000XA Contusion of unspecified knee, initial encounter: Secondary | ICD-10-CM

## 2014-12-07 NOTE — ED Notes (Addendum)
Pt states that she had rt sided knee replacement in march.  Pt states that she was "watering her maters" and there were thick weeds and she tripped and fell onto bilateral knees. C/o pain to both knees and numbness from the knee down on the lt side.

## 2014-12-07 NOTE — ED Provider Notes (Signed)
CSN: [id number]     Arrival date & time 12/07/14  1001 History   First MD Initiated Contact with Patient 12/07/14 1008     Chief Complaint  Patient presents with  . Fall  . Knee Pain      HPI  She presents for evaluation after a fall and complains of both knees hurting. Had right total knee arthroplasty in March. Was watering in her yard. Get her feet caught in some weeds. She'll follow her on a concrete. States she feels numbness in the anterior aspect of her leftlower leg. Pain worse in the right knee. Is able to and plan although with pain. She takes chronic pain medications at home and took a dose right after the accident.  Past Medical History  Diagnosis Date  . Depression   . Hx of pneumothorax     X2 IN PAST  . Headache     MIGRAINES  . Numbness     MULTIPLE AREAS - "from all the surgeries I've had"  . Arthritis     multiple areas  . Neuropathy   . Fibromyalgia   . Bone marrow disease     DX many years ago - pt unsure of name and unable to find info on type of disease  . Kidney disease     only has one functioning kidney - no problems  x 23 yrs  . Anemia   . H/O iron deficiency anemia   . Anxiety   . Liver failure, acute 5 years ago    cause unknown, after hysterectomy  . Complication of anesthesia 2011    slow to awaken after hysterectomy  . PONV (postoperative nausea and vomiting)    Past Surgical History  Procedure Laterality Date  . Polyp removal  2010    uterine  . Back surgery      multiple  . Hernia repair  2011  . Foot surgery  1989  . Shoulder surgery      Bilaterally X9 SURGERIES  . Abdominal hysterectomy    . Cervical  spine surg      SEVERAL TIMES  . Cervical discectomy      with fusion  . Knee arthroscopy with medial menisectomy Right 03/13/2014    Procedure: RIGHT KNEE ARTHROSCOPY WITH MEDIAL FEMORAL CONDYLE REPAIR;  Surgeon: Jacki Cones, MD;  Location: [facility];  Service: Orthopedics;  Laterality: Right;  . Knee arthroscopy with  drilling/microfracture Right 03/13/2014    Procedure: RIGHT MICROFRACTURE TECHNIQUE;  Surgeon: Jacki Cones, MD;  Location: [facility];  Service: Orthopedics;  Laterality: Right;  . Knee arthroscopy with medial patellar femoral ligament reconstruction Right 03/13/2014    Procedure: ABRASION CHONDROPLASTY OF MEDIAL FEMORAL;  Surgeon: Jacki Cones, MD;  Location: [facility];  Service: Orthopedics;  Laterality: Right;  . Synovectomy Right 03/13/2014    Procedure: SYNOVECTOMY OF THE SUPRAPATELLA POUCH;  Surgeon: Jacki Cones, MD;  Location: [facility];  Service: Orthopedics;  Laterality: Right;  . Total knee arthroplasty Right 07/30/2014    Procedure: RIGHT TOTAL KNEE ARTHROPLASTY;  Surgeon: Ranee Gosselin, MD;  Location: [facility];  Service: Orthopedics;  Laterality: Right;   Family History  Problem Relation Age of Onset  . Heart disease Mother   . Heart disease Father   . Heart disease Maternal Grandfather   . Heart disease Maternal Grandmother   . Heart disease Paternal Grandfather   . Heart disease Paternal Grandmother   . Colon cancer Father    History  Substance  Use Topics  . Smoking status: Never Smoker   . Smokeless tobacco: Never Used  . Alcohol Use: No   OB History    No data available     Review of Systems  Constitutional: Negative for fever, chills, diaphoresis, appetite change and fatigue.  HENT: Negative for mouth sores, sore throat and trouble swallowing.   Eyes: Negative for visual disturbance.  Respiratory: Negative for cough, chest tightness, shortness of breath and wheezing.   Cardiovascular: Negative for chest pain.  Gastrointestinal: Negative for nausea, vomiting, abdominal pain, diarrhea and abdominal distention.  Endocrine: Negative for polydipsia, polyphagia and polyuria.  Genitourinary: Negative for dysuria, frequency and hematuria.  Musculoskeletal: Negative for gait problem.       Bilateral knee pain  Skin: Negative for color change, pallor and rash.    Neurological: Negative for dizziness, syncope, light-headedness and headaches.  Hematological: Does not bruise/bleed easily.  Psychiatric/Behavioral: Negative for behavioral problems and confusion.      Allergies  Fish allergy; Nortriptyline; Tylenol; Bee venom; and Flexeril  Home Medications   Prior to Admission medications   Medication Sig Start Date End Date Taking? Authorizing Provider  buPROPion (WELLBUTRIN XL) 150 MG 24 hr tablet Take 300 mg by mouth every morning.    Yes Historical Provider, MD  diazepam (VALIUM) 5 MG tablet Take 5 mg by mouth every 8 (eight) hours as needed for anxiety.   Yes Historical Provider, MD  ferrous sulfate 325 (65 FE) MG tablet Take 1 tablet (325 mg total) by mouth 3 (three) times daily after meals. 07/31/14  Yes Amber Celedonio Savage, PA-C  FLUoxetine (PROZAC) 40 MG capsule Take 80 mg by mouth every morning. 2 07/22/10  Yes Historical Provider, MD  hydrOXYzine (ATARAX/VISTARIL) 50 MG tablet Take 100 mg by mouth 2 (two) times daily as needed for anxiety.   Yes Historical Provider, MD  ondansetron (ZOFRAN-ODT) 8 MG disintegrating tablet Take 8 mg by mouth every 8 (eight) hours as needed for nausea.   Yes Historical Provider, MD  Oxycodone HCl 20 MG TABS Take 40 mg by mouth every 6 (six) hours as needed (pain.).   Yes Historical Provider, MD  pregabalin (LYRICA) 225 MG capsule Take 225 mg by mouth 2 (two) times daily.   Yes Historical Provider, MD  rOPINIRole (REQUIP) 2 MG tablet Take 1 mg by mouth at bedtime.    Yes Historical Provider, MD  albuterol (VOSPIRE ER) 8 MG 12 hr tablet Take 2 tablets by mouth 2 (two) times daily.  07/30/10   Historical Provider, MD  methocarbamol (ROBAXIN) 500 MG tablet Take 1 tablet (500 mg total) by mouth every 6 (six) hours as needed for muscle spasms. Patient not taking: Reported on 12/07/2014 07/31/14   Dimitri Ped, PA-C  oxyCODONE 20 MG TABS Take 1 tablet (20 mg total) by mouth every 3 (three) hours as needed for severe  pain. Patient not taking: Reported on 12/07/2014 08/02/14   Dimitri Ped, PA-C  rivaroxaban (XARELTO) 10 MG TABS tablet Take 1 tablet (10 mg total) by mouth daily with breakfast. Patient not taking: Reported on 12/07/2014 07/31/14   Amber Constable, PA-C   BP 125/70 mmHg  Pulse 84  Temp(Src) 98.9 F (37.2 C) (Oral)  Resp 18  SpO2 100% Physical Exam  Constitutional: She is oriented to person, place, and time. She appears well-developed and well-nourished. No distress.  HENT:  Head: Normocephalic.  Eyes: Conjunctivae are normal. Pupils are equal, round, and reactive to light. No scleral icterus.  Neck: Normal range of  motion. Neck supple. No thyromegaly present.  Cardiovascular: Normal rate and regular rhythm.  Exam reveals no gallop and no friction rub.   No murmur heard. Pulmonary/Chest: Effort normal and breath sounds normal. No respiratory distress. She has no wheezes. She has no rales.  Abdominal: Soft. Bowel sounds are normal. She exhibits no distension. There is no tenderness. There is no rebound.  Musculoskeletal: Normal range of motion.       Legs: Neurological: She is alert and oriented to person, place, and time.  Skin: Skin is warm and dry. No rash noted.  Psychiatric: She has a normal mood and affect. Her behavior is normal.    ED Course  Procedures (including critical care time) Labs Review Labs Reviewed - No data to display  Imaging Review Dg Knee Complete 4 Views Left  12/07/2014   CLINICAL DATA:  Fall on concrete with left knee pain and numbness, initial encounter  EXAM: LEFT KNEE - COMPLETE 4+ VIEW  COMPARISON:  None.  FINDINGS: Degenerative changes are noted with medial osteophytes and joint space narrowing. Mild lateral osteophytes are noted as well. A moderate joint effusion is seen. No acute fracture or dislocation is noted.  IMPRESSION: Degenerative changes and joint effusion. No acute bony abnormality is noted.   Electronically Signed   By: Alcide Clever M.D.    On: 12/07/2014 11:22   Dg Knee Complete 4 Views Right  12/07/2014   CLINICAL DATA:  Status post fall today. Right knee pain. Initial encounter.  EXAM: RIGHT KNEE - COMPLETE 4+ VIEW  COMPARISON:  Plain films right knee 12/20/2010.  FINDINGS: The patient has undergone right total knee replacement since the prior examination. The device is located without hardware complication. There is no fracture. No joint effusion is seen.  IMPRESSION: No acute abnormality. Status post right knee replacement without evidence of complication.   Electronically Signed   By: Drusilla Kanner M.D.   On: 12/07/2014 11:21     EKG Interpretation None      MDM   Final diagnoses:  Knee contusion, unspecified laterality, initial encounter    Conservative treatment.    Rolland Porter, MD 12/07/14 1150

## 2014-12-07 NOTE — Discharge Instructions (Signed)
Minimize your weightbearing. Stay off your feet the next few days. Ice packs 20 minutes at a time 3-4 times per day. Contusion A contusion is a deep bruise. Contusions happen when an injury causes bleeding under the skin. Signs of bruising include pain, puffiness (swelling), and discolored skin. The contusion may turn blue, purple, or yellow. HOME CARE   Put ice on the injured area.  Put ice in a plastic bag.  Place a towel between your skin and the bag.  Leave the ice on for 15-20 minutes, 03-04 times a day.  Only take medicine as told by your doctor.  Rest the injured area.  If possible, raise (elevate) the injured area to lessen puffiness. GET HELP RIGHT AWAY IF:   You have more bruising or puffiness.  You have pain that is getting worse.  Your puffiness or pain is not helped by medicine. MAKE SURE YOU:   Understand these instructions.  Will watch your condition.  Will get help right away if you are not doing well or get worse. Document Released: 10/20/2007 Document Revised: 07/26/2011 Document Reviewed: 03/08/2011 [facility] Patient Information 2015 [place], [place]. This information is not intended to replace advice given to you by your health care provider. Make sure you discuss any questions you have with your health care provider.

## 2015-04-01 ENCOUNTER — Ambulatory Visit (HOSPITAL_COMMUNITY)
Admission: RE | Admit: 2015-04-01 | Discharge: 2015-04-01 | Disposition: A | Discharge status: Home / Self Care | Payer: Medicare Other | Source: Ambulatory Visit | Attending: Cardiovascular Disease | Admitting: Cardiovascular Disease

## 2015-04-01 ENCOUNTER — Other Ambulatory Visit (HOSPITAL_COMMUNITY): Payer: Self-pay | Admitting: Orthopedic Surgery

## 2015-04-01 DIAGNOSIS — M7989 Other specified soft tissue disorders: Secondary | ICD-10-CM | POA: Diagnosis not present

## 2015-04-01 DIAGNOSIS — M79662 Pain in left lower leg: Secondary | ICD-10-CM

## 2015-04-01 DIAGNOSIS — M79605 Pain in left leg: Secondary | ICD-10-CM | POA: Insufficient documentation

## 2015-09-18 ENCOUNTER — Other Ambulatory Visit: Payer: Self-pay | Admitting: Neurosurgery

## 2015-09-18 DIAGNOSIS — M545 Low back pain, unspecified: Secondary | ICD-10-CM

## 2015-09-18 DIAGNOSIS — G8929 Other chronic pain: Secondary | ICD-10-CM

## 2015-09-27 ENCOUNTER — Ambulatory Visit
Admission: RE | Admit: 2015-09-27 | Discharge: 2015-09-27 | Disposition: A | Discharge status: Home / Self Care | Payer: Medicare Other | Source: Ambulatory Visit | Attending: Neurosurgery | Admitting: Neurosurgery

## 2015-09-27 DIAGNOSIS — G8929 Other chronic pain: Secondary | ICD-10-CM

## 2015-09-27 DIAGNOSIS — M545 Low back pain: Principal | ICD-10-CM

## 2015-09-27 MED ORDER — GADOBENATE DIMEGLUMINE 529 MG/ML IV SOLN
20.0000 mL | Freq: Once | INTRAVENOUS | Status: AC | PRN
  Administered 2015-09-27: 20 mL via INTRAVENOUS

## 2015-11-19 ENCOUNTER — Other Ambulatory Visit: Payer: Self-pay | Admitting: Family Medicine

## 2015-11-19 DIAGNOSIS — G4489 Other headache syndrome: Secondary | ICD-10-CM

## 2015-11-23 ENCOUNTER — Ambulatory Visit
Admission: RE | Admit: 2015-11-23 | Discharge: 2015-11-23 | Disposition: A | Discharge status: Home / Self Care | Payer: Medicare Other | Source: Ambulatory Visit | Attending: Family Medicine | Admitting: Family Medicine

## 2015-11-23 DIAGNOSIS — G4489 Other headache syndrome: Secondary | ICD-10-CM

## 2016-01-10 ENCOUNTER — Emergency Department: Payer: Medicare Other

## 2016-01-10 ENCOUNTER — Emergency Department
Admission: EM | Admit: 2016-01-10 | Discharge: 2016-01-10 | Disposition: A | Discharge status: Home / Self Care | Payer: Medicare Other | Attending: Emergency Medicine | Admitting: Emergency Medicine

## 2016-01-10 ENCOUNTER — Encounter: Payer: Self-pay | Admitting: Emergency Medicine

## 2016-01-10 DIAGNOSIS — Y9389 Activity, other specified: Secondary | ICD-10-CM | POA: Insufficient documentation

## 2016-01-10 DIAGNOSIS — W2203XA Walked into furniture, initial encounter: Secondary | ICD-10-CM | POA: Insufficient documentation

## 2016-01-10 DIAGNOSIS — Y999 Unspecified external cause status: Secondary | ICD-10-CM | POA: Diagnosis not present

## 2016-01-10 DIAGNOSIS — N189 Chronic kidney disease, unspecified: Secondary | ICD-10-CM | POA: Diagnosis not present

## 2016-01-10 DIAGNOSIS — Y929 Unspecified place or not applicable: Secondary | ICD-10-CM | POA: Insufficient documentation

## 2016-01-10 DIAGNOSIS — S92511A Displaced fracture of proximal phalanx of right lesser toe(s), initial encounter for closed fracture: Secondary | ICD-10-CM | POA: Diagnosis not present

## 2016-01-10 DIAGNOSIS — S92911A Unspecified fracture of right toe(s), initial encounter for closed fracture: Secondary | ICD-10-CM

## 2016-01-10 DIAGNOSIS — S99921A Unspecified injury of right foot, initial encounter: Secondary | ICD-10-CM | POA: Diagnosis present

## 2016-01-10 MED ORDER — MELOXICAM 7.5 MG PO TABS: 7.5000 mg | ORAL_TABLET | Freq: Every day | ORAL | 0 refills | Status: DC

## 2016-01-10 NOTE — ED Triage Notes (Signed)
Pt hit toe last night - rt 5th toe red and swollen

## 2016-01-10 NOTE — ED Provider Notes (Signed)
[facility] Emergency Department Provider Note  ____________________________________________  Time seen: Approximately 8:36 PM  I have reviewed the triage vital signs and the nursing notes.   HISTORY  Chief Complaint Toe Pain    HPI [name] is a 55 y.o. female who presents emergency department complaining of fifth digit pain to the right foot. Patient reports that she has broken multiple toes in the past and this feels similar but slightly worse than normal. Patient states that she hit her foot against a table during the night. Patient reports pain and swelling to digit of the right foot. No other complaints. Patient is on chronic pain medication and is taken same today. Patient reports having a "bone disease" which is weak in her bones and major prone to fractures. Patient is unable to recall disease name but states that it is not osteopenia or osteoporosis.   Past Medical History:  Diagnosis Date  . Anemia   . Anxiety   . Arthritis    multiple areas  . Bone marrow disease    DX many years ago - pt unsure of name and unable to find info on type of disease  . Complication of anesthesia 2011   slow to awaken after hysterectomy  . Depression   . Fibromyalgia   . H/O iron deficiency anemia   . Headache    MIGRAINES  . Hx of pneumothorax    X2 IN PAST  . Kidney disease    only has one functioning kidney - no problems  x 23 yrs  . Liver failure, acute 5 years ago   cause unknown, after hysterectomy  . Neuropathy (HCC)   . Numbness    MULTIPLE AREAS - "from all the surgeries I've had"  . PONV (postoperative nausea and vomiting)     Patient Active Problem List   Diagnosis Date Noted  . History of total knee arthroplasty 07/30/2014  . Osteoarthritis of right knee 03/13/2014  . Tear of medial meniscus of right knee, current 03/13/2014  . Kidney disease 08/13/2010  . Anemia 08/13/2010  . SOB (shortness of breath) 08/13/2010  . Abnormal  uterine bleeding 08/13/2010  . Morbid obesity (HCC) 08/13/2010  . Depression     Past Surgical History:  Procedure Laterality Date  . ABDOMINAL HYSTERECTOMY    . BACK SURGERY     multiple  . CERVICAL  SPINE SURG     SEVERAL TIMES  . CERVICAL DISCECTOMY     with fusion  . FOOT SURGERY  1989  . HERNIA REPAIR  2011  . KNEE ARTHROSCOPY WITH DRILLING/MICROFRACTURE Right 03/13/2014   Procedure: RIGHT MICROFRACTURE TECHNIQUE;  Surgeon: Jacki Cones, MD;  Location: [facility];  Service: Orthopedics;  Laterality: Right;  . KNEE ARTHROSCOPY WITH MEDIAL MENISECTOMY Right 03/13/2014   Procedure: RIGHT KNEE ARTHROSCOPY WITH MEDIAL FEMORAL CONDYLE REPAIR;  Surgeon: Jacki Cones, MD;  Location: [facility];  Service: Orthopedics;  Laterality: Right;  . KNEE ARTHROSCOPY WITH MEDIAL PATELLAR FEMORAL LIGAMENT RECONSTRUCTION Right 03/13/2014   Procedure: ABRASION CHONDROPLASTY OF MEDIAL FEMORAL;  Surgeon: Jacki Cones, MD;  Location: [facility];  Service: Orthopedics;  Laterality: Right;  . polyp removal  2010   uterine  . SHOULDER SURGERY     Bilaterally X9 SURGERIES  . SYNOVECTOMY Right 03/13/2014   Procedure: SYNOVECTOMY OF THE SUPRAPATELLA POUCH;  Surgeon: Jacki Cones, MD;  Location: [facility];  Service: Orthopedics;  Laterality: Right;  . TOTAL KNEE ARTHROPLASTY Right 07/30/2014   Procedure:  RIGHT TOTAL KNEE ARTHROPLASTY;  Surgeon: Ranee Gosselin, MD;  Location: [facility];  Service: Orthopedics;  Laterality: Right;    Prior to Admission medications   Medication Sig Start Date End Date Taking? Authorizing Provider  albuterol (VOSPIRE ER) 8 MG 12 hr tablet Take 2 tablets by mouth 2 (two) times daily.  07/30/10   Historical Provider, MD  buPROPion (WELLBUTRIN XL) 150 MG 24 hr tablet Take 300 mg by mouth every morning.     Historical Provider, MD  diazepam (VALIUM) 5 MG tablet Take 5 mg by mouth every 8 (eight) hours as needed for anxiety.    Historical Provider, MD  ferrous sulfate 325 (65 FE) MG  tablet Take 1 tablet (325 mg total) by mouth 3 (three) times daily after meals. 07/31/14   Amber Celedonio Savage, PA-C  FLUoxetine (PROZAC) 40 MG capsule Take 80 mg by mouth every morning. 2 07/22/10   Historical Provider, MD  hydrOXYzine (ATARAX/VISTARIL) 50 MG tablet Take 100 mg by mouth 2 (two) times daily as needed for anxiety.    Historical Provider, MD  meloxicam (MOBIC) 7.5 MG tablet Take 1 tablet (7.5 mg total) by mouth daily. 01/10/16 01/09/17  Christiane Ha D Sheron Tallman, PA-C  methocarbamol (ROBAXIN) 500 MG tablet Take 1 tablet (500 mg total) by mouth every 6 (six) hours as needed for muscle spasms. Patient not taking: Reported on 12/07/2014 07/31/14   Dimitri Ped, PA-C  ondansetron (ZOFRAN-ODT) 8 MG disintegrating tablet Take 8 mg by mouth every 8 (eight) hours as needed for nausea.    Historical Provider, MD  oxyCODONE 20 MG TABS Take 1 tablet (20 mg total) by mouth every 3 (three) hours as needed for severe pain. Patient not taking: Reported on 12/07/2014 08/02/14   Dimitri Ped, PA-C  Oxycodone HCl 20 MG TABS Take 40 mg by mouth every 6 (six) hours as needed (pain.).    Historical Provider, MD  pregabalin (LYRICA) 225 MG capsule Take 225 mg by mouth 2 (two) times daily.    Historical Provider, MD  rivaroxaban (XARELTO) 10 MG TABS tablet Take 1 tablet (10 mg total) by mouth daily with breakfast. Patient not taking: Reported on 12/07/2014 07/31/14   Dimitri Ped, PA-C  rOPINIRole (REQUIP) 2 MG tablet Take 1 mg by mouth at bedtime.     Historical Provider, MD    Allergies Fish allergy; Nortriptyline; Tylenol [acetaminophen]; Bee venom; and Flexeril [cyclobenzaprine hcl]  Family History  Problem Relation Age of Onset  . Heart disease Mother   . Heart disease Father   . Colon cancer Father   . Heart disease Maternal Grandfather   . Heart disease Maternal Grandmother   . Heart disease Paternal Grandfather   . Heart disease Paternal Grandmother     Social History Social History  Substance  Use Topics  . Smoking status: Never Smoker  . Smokeless tobacco: Never Used  . Alcohol use No     Review of Systems  Constitutional: No fever/chills Cardiovascular: no chest pain. Respiratory: no cough. No SOB. Musculoskeletal: Positive for pain to the fifth digit of the right foot. Skin: Negative for rash, abrasions, lacerations, ecchymosis. Neurological: Negative for headaches, focal weakness or numbness. 10-point ROS otherwise negative.  ____________________________________________   PHYSICAL EXAM:  VITAL SIGNS: ED Triage Vitals  Enc Vitals Group     BP 01/10/16 1902 138/74     Pulse Rate 01/10/16 1902 80     Resp 01/10/16 1902 18     Temp 01/10/16 1902 98.3 F (36.8 C)  Temp src --      SpO2 01/10/16 1902 100 %     Weight 01/10/16 1903 214 lb (97.1 kg)     Height 01/10/16 1903 5\' 5"  (1.651 m)     Head Circumference --      Peak Flow --      Pain Score 01/10/16 1903 5     Pain Loc --      Pain Edu? --      Excl. in GC? --      Constitutional: Alert and oriented. Well appearing and in no acute distress. Eyes: Conjunctivae are normal. PERRL. EOMI. Head: Atraumatic. Cardiovascular: Normal rate, regular rhythm. Normal S1 and S2.  Good peripheral circulation. Respiratory: Normal respiratory effort without tachypnea or retractions. Lungs CTAB. Good air entry to the bases with no decreased or absent breath sounds. Musculoskeletal: Patient with edema to the fifth digit of the right foot. Patient does have good sensation and flexion of the fifth digit. Patient is very tender to palpation over the MTP joint of the fifth digit. No palpable abnormality. Sensation and cap refill intact distally. Neurologic:  Normal speech and language. No gross focal neurologic deficits are appreciated.  Skin:  Skin is warm, dry and intact. No rash noted. Psychiatric: Mood and affect are normal. Speech and behavior are normal. Patient exhibits appropriate insight and  judgement.   ____________________________________________   LABS (all labs ordered are listed, but only abnormal results are displayed)  Labs Reviewed - No data to display ____________________________________________  EKG   ____________________________________________  RADIOLOGY Festus BarrenI, Joclynn Lumb D Dorlene Footman, personally viewed and evaluated these images (plain radiographs) as part of my medical decision making, as well as reviewing the written report by the radiologist.  Dg Foot Complete Right  Result Date: 01/10/2016 CLINICAL DATA:  Injury to the right foot. Previous broken toes. Pain is laterally. EXAM: RIGHT FOOT COMPLETE - 3+ VIEW COMPARISON:  Right ankle 08/31/2012 FINDINGS: Oblique nondisplaced acute appearing fracture of the right fifth proximal phalangeal head. Fracture line probably extends to the articular surface. Mild soft tissue swelling. Mild degenerative changes in the first metatarsal-phalangeal and interphalangeal joints. No radiopaque soft tissue foreign bodies. IMPRESSION: Acute nondisplaced fracture of the right fifth proximal phalangeal head, extending to the proximal interphalangeal joint surface. Electronically Signed   By: Burman NievesWilliam  Stevens M.D.   On: 01/10/2016 19:59    ____________________________________________    PROCEDURES  Procedure(s) performed:    Procedures    Medications - No data to display   ____________________________________________   INITIAL IMPRESSION / ASSESSMENT AND PLAN / ED COURSE  Pertinent labs & imaging results that were available during my care of the patient were reviewed by me and considered in my medical decision making (see chart for details).  Review of the [place] CSRS was performed in accordance of the [facility] prior to dispensing any controlled drugs.  Clinical Course    Patient's diagnosis is consistent with Toe fracture to the right foot. X-ray reveals the above diagnosis. Patient is advised to use well supportive shoes,  stay off it as much as possible, use a cane to help weight-bear on the affected extremity. Patient will be discharged home with prescriptions for anti-inflammatories for symptomatic control. Patient takes pain medication or ongoing basis for chronic pain. Patient does use blood thinners but states that she has been able to take anti-inflammatories while on same. As such, anti-inflammatories will be prescribed for pain relief.. Patient is to follow up with her orthopedic surgeon as needed or otherwise  directed. Patient is given ED precautions to return to the ED for any worsening or new symptoms.     ____________________________________________  FINAL CLINICAL IMPRESSION(S) / ED DIAGNOSES  Final diagnoses:  Toe fracture, right, closed, initial encounter      NEW MEDICATIONS STARTED DURING THIS VISIT:  New Prescriptions   MELOXICAM (MOBIC) 7.5 MG TABLET    Take 1 tablet (7.5 mg total) by mouth daily.        This chart was dictated using voice recognition software/Dragon. Despite best efforts to proofread, errors can occur which can change the meaning. Any change was purely unintentional.    Racheal Patches, PA-C 01/10/16 2047    Rockne Menghini, MD 01/10/16 [phone]

## 2016-01-10 NOTE — ED Notes (Signed)
XR ate bedside

## 2016-02-27 ENCOUNTER — Other Ambulatory Visit: Payer: Self-pay | Admitting: Neurosurgery

## 2016-02-27 DIAGNOSIS — M542 Cervicalgia: Secondary | ICD-10-CM

## 2016-03-13 ENCOUNTER — Other Ambulatory Visit: Payer: Medicare Other

## 2016-03-16 ENCOUNTER — Other Ambulatory Visit: Payer: Self-pay | Admitting: Family Medicine

## 2016-03-16 ENCOUNTER — Ambulatory Visit
Admission: RE | Admit: 2016-03-16 | Discharge: 2016-03-16 | Disposition: A | Discharge status: Home / Self Care | Payer: Medicare Other | Source: Ambulatory Visit | Attending: Family Medicine | Admitting: Family Medicine

## 2016-03-16 DIAGNOSIS — Z01818 Encounter for other preprocedural examination: Secondary | ICD-10-CM

## 2016-03-24 ENCOUNTER — Ambulatory Visit
Admission: RE | Admit: 2016-03-24 | Discharge: 2016-03-24 | Disposition: A | Discharge status: Home / Self Care | Payer: Medicare Other | Source: Ambulatory Visit | Attending: Neurosurgery | Admitting: Neurosurgery

## 2016-03-24 DIAGNOSIS — M542 Cervicalgia: Secondary | ICD-10-CM

## 2016-03-24 NOTE — H&P (Signed)
TOTAL KNEE REVISION ADMISSION H&P  Patient is being admitted for right knee open scar debridement with possible poly exchange.  Subjective:  Chief Complaint:  Status post right TKA with patellar clunk.  HPI: [name], 55 y.o. female, has a history of pain and functional disability in the right knee(s) due to pain and patellar clunk and patient has failed non-surgical conservative treatments for greater than 12 weeks to include NSAID's and/or analgesics, use of assistive devices and activity modification. The indications for surgery on  the total knee arthroplasty are patellar clunk. Onset of symptoms was gradual starting 9 months ago with gradually worsening course since that time.  Prior procedures on the right knee(s) include arthroplasty.  Patient currently rates pain in the right knee(s) at 10 out of 10 with activity. There is night pain, worsening of pain with activity and weight bearing, pain that interferes with activities of daily living, pain with passive range of motion, crepitus and joint swelling.  Patient has evidence of previous TKA by imaging studies. This condition presents safety issues increasing the risk of falls.  There is no current active infection.   Risks, benefits and expectations were discussed with the patient.  Risks including but not limited to the risk of anesthesia, blood clots, nerve damage, blood vessel damage, failure of the prosthesis, infection and up to and including death.  Patient understand the risks, benefits and expectations and wishes to proceed with surgery.   PCP: Kaleen MaskELKINS,WILSON OLIVER, MD  D/C Plans:      Home  Post-op Meds:       No Rx given  Tranexamic Acid:      To be given - IV   Decadron:      Is to be given  FYI:     ASA (ok per patient)  On Morphine 30 mg q 8 hrs (pre-op)    Patient Active Problem List   Diagnosis Date Noted  . History of total knee arthroplasty 07/30/2014  . Osteoarthritis of right knee 03/13/2014  . Tear of  medial meniscus of right knee, current 03/13/2014  . Kidney disease 08/13/2010  . Anemia 08/13/2010  . SOB (shortness of breath) 08/13/2010  . Abnormal uterine bleeding 08/13/2010  . Morbid obesity (HCC) 08/13/2010  . Depression    Past Medical History:  Diagnosis Date  . Anemia   . Anxiety   . Arthritis    multiple areas  . Bone marrow disease    DX many years ago - pt unsure of name and unable to find info on type of disease  . Complication of anesthesia 2011   slow to awaken after hysterectomy  . Depression   . Fibromyalgia   . H/O iron deficiency anemia   . Headache    MIGRAINES  . Hx of pneumothorax    X2 IN PAST  . Kidney disease    only has one functioning kidney - no problems  x 23 yrs  . Liver failure, acute 5 years ago   cause unknown, after hysterectomy  . Neuropathy (HCC)   . Numbness    MULTIPLE AREAS - "from all the surgeries I've had"  . PONV (postoperative nausea and vomiting)     Past Surgical History:  Procedure Laterality Date  . ABDOMINAL HYSTERECTOMY    . BACK SURGERY     multiple  . CERVICAL  SPINE SURG     SEVERAL TIMES  . CERVICAL DISCECTOMY     with fusion  . FOOT SURGERY  1989  .  HERNIA REPAIR  2011  . KNEE ARTHROSCOPY WITH DRILLING/MICROFRACTURE Right 03/13/2014   Procedure: RIGHT MICROFRACTURE TECHNIQUE;  Surgeon: Jacki Conesonald A Gioffre, MD;  Location: [facility];  Service: Orthopedics;  Laterality: Right;  . KNEE ARTHROSCOPY WITH MEDIAL MENISECTOMY Right 03/13/2014   Procedure: RIGHT KNEE ARTHROSCOPY WITH MEDIAL FEMORAL CONDYLE REPAIR;  Surgeon: Jacki Conesonald A Gioffre, MD;  Location: [facility];  Service: Orthopedics;  Laterality: Right;  . KNEE ARTHROSCOPY WITH MEDIAL PATELLAR FEMORAL LIGAMENT RECONSTRUCTION Right 03/13/2014   Procedure: ABRASION CHONDROPLASTY OF MEDIAL FEMORAL;  Surgeon: Jacki Conesonald A Gioffre, MD;  Location: [facility];  Service: Orthopedics;  Laterality: Right;  . polyp removal  2010   uterine  . SHOULDER SURGERY     Bilaterally X9 SURGERIES   . SYNOVECTOMY Right 03/13/2014   Procedure: SYNOVECTOMY OF THE SUPRAPATELLA POUCH;  Surgeon: Jacki Conesonald A Gioffre, MD;  Location: [facility];  Service: Orthopedics;  Laterality: Right;  . TOTAL KNEE ARTHROPLASTY Right 07/30/2014   Procedure: RIGHT TOTAL KNEE ARTHROPLASTY;  Surgeon: Ranee Gosselinonald Gioffre, MD;  Location: [facility];  Service: Orthopedics;  Laterality: Right;    No prescriptions prior to admission.   Allergies  Allergen Reactions  . Fish Allergy Anaphylaxis    To all fish, not just shellfish.  Cannot tolerate even the smell of seafood without her airway swelling/closing.  . Nortriptyline Other (See Comments)    hallucinations  . Tylenol [Acetaminophen] Other (See Comments)    Cannot take due to past liver failure  . Bee Venom Hives  . Flexeril [Cyclobenzaprine Hcl] Other (See Comments)    Mood changes    Social History  Substance Use Topics  . Smoking status: Never Smoker  . Smokeless tobacco: Never Used  . Alcohol use No    Family History  Problem Relation Age of Onset  . Heart disease Mother   . Heart disease Father   . Colon cancer Father   . Heart disease Maternal Grandfather   . Heart disease Maternal Grandmother   . Heart disease Paternal Grandfather   . Heart disease Paternal Grandmother      Review of Systems  Constitutional: Negative.   HENT: Negative.   Eyes: Negative.   Respiratory: Positive for shortness of breath (on exertion).   Cardiovascular: Negative.   Gastrointestinal: Negative.   Genitourinary: Negative.   Musculoskeletal: Positive for back pain, joint pain and myalgias.  Skin: Negative.   Neurological: Positive for headaches.  Endo/Heme/Allergies: Negative.   Psychiatric/Behavioral: Positive for depression. The patient is nervous/anxious.      Objective:  Physical Exam  Constitutional: She is oriented to person, place, and time. She appears well-developed.  HENT:  Head: Normocephalic.  Mouth/Throat: She has dentures.  Eyes: Pupils are equal,  round, and reactive to light.  Neck: Neck supple. No JVD present. No tracheal deviation present. No thyromegaly present.  Cardiovascular: Normal rate, regular rhythm, normal heart sounds and intact distal pulses.   Respiratory: Effort normal and breath sounds normal. No respiratory distress. She has no wheezes.  GI: Soft. There is no tenderness. There is no guarding.  Musculoskeletal:       Right knee: She exhibits decreased range of motion, swelling, laceration (healed previous incision) and bony tenderness. She exhibits no ecchymosis, no deformity and no erythema. Tenderness found.  Lymphadenopathy:    She has no cervical adenopathy.  Neurological: She is alert and oriented to person, place, and time. A sensory deficit (bilateral neuropathy) is present.  Skin: Skin is warm and dry.  Psychiatric: She has a normal  mood and affect.      Labs:  Estimated body mass index is 35.61 kg/m as calculated from the following:   Height as of 01/10/16: 5\' 5"  (1.651 m).   Weight as of 01/10/16: 97.1 kg (214 lb).  Imaging Review Plain radiographs demonstrate severe degenerative joint disease of the right knee(s). The overall alignment is neutral.There is evidence of previous TKA. The bone quality appears to be good for age and reported activity level.   Assessment/Plan:  End stage arthritis, right knee(s) with failed previous arthroplasty.   The patient history, physical examination, clinical judgment of the provider and imaging studies are consistent with end stage degenerative joint disease of the right knee(s), previous total knee arthroplasty. Revision total knee arthroplasty is deemed medically necessary. The treatment options including medical management, injection therapy, arthroscopy and revision arthroplasty were discussed at length. The risks and benefits of revision total knee arthroplasty were presented and reviewed. The risks due to aseptic loosening, infection, stiffness, patella tracking  problems, thromboembolic complications and other imponderables were discussed. The patient acknowledged the explanation, agreed to proceed with the plan and consent was signed. Patient is being admitted for inpatient treatment for surgery, pain control, PT, OT, prophylactic antibiotics, VTE prophylaxis, progressive ambulation and ADL's and discharge planning.The patient is planning to be discharged home.     Anastasio Auerbach Brezlyn Manrique   PA-C  03/24/2016, 1:33 PM

## 2016-03-30 ENCOUNTER — Encounter (HOSPITAL_COMMUNITY): Payer: Self-pay

## 2016-03-30 ENCOUNTER — Encounter (HOSPITAL_COMMUNITY)
Admission: RE | Admit: 2016-03-30 | Discharge: 2016-03-30 | Disposition: A | Discharge status: Home / Self Care | Payer: Medicare Other | Source: Ambulatory Visit | Attending: Orthopedic Surgery | Admitting: Orthopedic Surgery

## 2016-03-30 DIAGNOSIS — T84092A Other mechanical complication of internal right knee prosthesis, initial encounter: Secondary | ICD-10-CM | POA: Insufficient documentation

## 2016-03-30 DIAGNOSIS — Z01812 Encounter for preprocedural laboratory examination: Secondary | ICD-10-CM | POA: Diagnosis present

## 2016-03-30 HISTORY — DX: Dyspnea, unspecified: R06.00

## 2016-03-30 HISTORY — DX: Family history of other specified conditions: Z84.89

## 2016-03-30 HISTORY — DX: Personal history of other medical treatment: Z92.89

## 2016-03-30 HISTORY — DX: Personal history of other diseases of the respiratory system: Z87.09

## 2016-03-30 LAB — COMPREHENSIVE METABOLIC PANEL
ALBUMIN: 4.3 g/dL (ref 3.5–5.0)
ALT: 13 U/L — AB (ref 14–54)
AST: 19 U/L (ref 15–41)
Alkaline Phosphatase: 96 U/L (ref 38–126)
Anion gap: 6 (ref 5–15)
BUN: 13 mg/dL (ref 6–20)
CHLORIDE: 113 mmol/L — AB (ref 101–111)
CO2: 23 mmol/L (ref 22–32)
CREATININE: 0.81 mg/dL (ref 0.44–1.00)
Calcium: 8.8 mg/dL — ABNORMAL LOW (ref 8.9–10.3)
GFR calc Af Amer: >60 mL/min (ref >60)
GLUCOSE: 94 mg/dL (ref 65–99)
POTASSIUM: 3.7 mmol/L (ref 3.5–5.1)
Sodium: 142 mmol/L (ref 135–145)
Total Bilirubin: 0.4 mg/dL (ref 0.3–1.2)
Total Protein: 7.5 g/dL (ref 6.5–8.1)

## 2016-03-30 LAB — PROTIME-INR
INR: 1.15
Prothrombin Time: 14.7 seconds (ref 11.4–15.2)

## 2016-03-30 LAB — CBC
HEMATOCRIT: 35.4 % — AB (ref 36.0–46.0)
Hemoglobin: 11.1 g/dL — ABNORMAL LOW (ref 12.0–15.0)
MCH: 27.1 pg (ref 26.0–34.0)
MCHC: 31.4 g/dL (ref 30.0–36.0)
MCV: 86.3 fL (ref 78.0–100.0)
PLATELETS: 359 10*3/uL (ref 150–400)
RBC: 4.1 MIL/uL (ref 3.87–5.11)
RDW: 14 % (ref 11.5–15.5)
WBC: 9.1 10*3/uL (ref 4.0–10.5)

## 2016-03-30 LAB — SURGICAL PCR SCREEN
MRSA, PCR: NEGATIVE
Staphylococcus aureus: POSITIVE — AB

## 2016-03-30 NOTE — Progress Notes (Addendum)
Clearance note per Dr Shelah LewandowskyElkin on chart 03/16/2016 A1C results per chart 03/16/2016

## 2016-03-30 NOTE — Patient Instructions (Signed)
[name]  03/30/2016   Your procedure is scheduled on: Monday April 05, 2016  Report to [facility]  [place]  [place]vators to 3rd floor to  Short Stay Center at 10:30 AM.  Call this number if you have problems the morning of surgery [phone]   Remember: ONLY 1 PERSON MAY GO WITH YOU TO SHORT STAY TO GET  READY MORNING OF YOUR SURGERY.  Do not eat food or drink liquids :After Midnight.     Take these medicines the morning of surgery with A SIP OF WATER: Albuterol; Bupropion (Wellbutrin); Morphine; Topiramate (Topamax); Hyroxyzine if needed                                 You may not have any metal on your body including hair pins and              piercings  Do not wear jewelry, make-up, lotions, powders or perfumes, deodorant             Do not wear nail polish.  Do not shave  48 hours prior to surgery.      Do not bring valuables to the hospital. [facility] IS NOT             RESPONSIBLE   FOR VALUABLES.  Contacts, dentures or bridgework may not be worn into surgery.  Leave suitcase in the car. After surgery it may be brought to your room.                 Please read over the following fact sheets you were given:MRSA INFORMATION SHEET: BLOOD TRANSFUSION INFORMATION SHEET: INCENTIVE SPIROMETER  _____________________________________________________________________             [facility] Pre[facility] Before surgery, you can play an important role.  Because skin is not sterile, your skin needs to be as free of germs as possible.  You can reduce the number of germs on your skin by washing with CHG (chlorahexidine gluconate) soap before surgery.  CHG is an antiseptic cleaner which kills germs and bonds with the skin to continue killing germs even after washing. Please DO NOT use if you have an allergy to CHG or antibacterial soaps.  If your skin becomes reddened/irritated stop using the CHG and inform your nurse when you  arrive at Short Stay. Do not shave (including legs and underarms) for at least 48 hours prior to the first CHG shower.  You may shave your face/neck. Please follow these instructions carefully:  1.  Shower with CHG Soap the night before surgery and the  morning of Surgery.  2.  If you choose to wash your hair, wash your hair first as usual with your  normal  shampoo.  3.  After you shampoo, rinse your hair and body thoroughly to remove the  shampoo.                           4.  Use CHG as you would any other liquid soap.  You can apply chg directly  to the skin and wash                       Gently with a scrungie or clean washcloth.  5.  Apply the CHG Soap to your body ONLY FROM THE NECK DOWN.   Do not use on face/ open                           Wound or open sores. Avoid contact with eyes, ears mouth and genitals (private parts).                       Wash face,  Genitals (private parts) with your normal soap.             6.  Wash thoroughly, paying special attention to the area where your surgery  will be performed.  7.  Thoroughly rinse your body with warm water from the neck down.  8.  DO NOT shower/wash with your normal soap after using and rinsing off  the CHG Soap.                9.  Pat yourself dry with a clean towel.            10.  Wear clean pajamas.            11.  Place clean sheets on your bed the night of your first shower and do not  sleep with pets. Day of Surgery : Do not apply any lotions/deodorants the morning of surgery.  Please wear clean clothes to the hospital/surgery center.  FAILURE TO FOLLOW THESE INSTRUCTIONS MAY RESULT IN THE CANCELLATION OF YOUR SURGERY PATIENT SIGNATURE_________________________________  NURSE SIGNATURE__________________________________  ________________________________________________________________________   Adam Phenix  An incentive spirometer is a tool that can help keep your lungs clear and active. This tool measures how  well you are filling your lungs with each breath. Taking long deep breaths may help reverse or decrease the chance of developing breathing (pulmonary) problems (especially infection) following:  A long period of time when you are unable to move or be active. BEFORE THE PROCEDURE   If the spirometer includes an indicator to show your best effort, your nurse or respiratory therapist will set it to a desired goal.  If possible, sit up straight or lean slightly forward. Try not to slouch.  Hold the incentive spirometer in an upright position. INSTRUCTIONS FOR USE  1. Sit on the edge of your bed if possible, or sit up as far as you can in bed or on a chair. 2. Hold the incentive spirometer in an upright position. 3. Breathe out normally. 4. Place the mouthpiece in your mouth and seal your lips tightly around it. 5. Breathe in slowly and as deeply as possible, raising the piston or the ball toward the top of the column. 6. Hold your breath for 3-5 seconds or for as long as possible. Allow the piston or ball to fall to the bottom of the column. 7. Remove the mouthpiece from your mouth and breathe out normally. 8. Rest for a few seconds and repeat Steps 1 through 7 at least 10 times every 1-2 hours when you are awake. Take your time and take a few normal breaths between deep breaths. 9. The spirometer may include an indicator to show your best effort. Use the indicator as a goal to work toward during each repetition. 10. After each set of 10 deep breaths, practice coughing to be sure your lungs are clear. If you have an incision (the cut made at the time of surgery), support your incision when coughing by placing a pillow or  rolled up towels firmly against it. Once you are able to get out of bed, walk around indoors and cough well. You may stop using the incentive spirometer when instructed by your caregiver.  RISKS AND COMPLICATIONS  Take your time so you do not get dizzy or light-headed.  If you  are in pain, you may need to take or ask for pain medication before doing incentive spirometry. It is harder to take a deep breath if you are having pain. AFTER USE  Rest and breathe slowly and easily.  It can be helpful to keep track of a log of your progress. Your caregiver can provide you with a simple table to help with this. If you are using the spirometer at home, follow these instructions: [place] IF:   You are having difficultly using the spirometer.  You have trouble using the spirometer as often as instructed.  Your pain medication is not giving enough relief while using the spirometer.  You develop fever of 100.5 F (38.1 C) or higher. SEEK IMMEDIATE MEDICAL CARE IF:   You cough up bloody sputum that had not been present before.  You develop fever of 102 F (38.9 C) or greater.  You develop worsening pain at or near the incision site. MAKE SURE YOU:   Understand these instructions.  Will watch your condition.  Will get help right away if you are not doing well or get worse. Document Released: 09/13/2006 Document Revised: 07/26/2011 Document Reviewed: 11/14/2006 ExitCare Patient Information 2014 ExitCare, [place].   ________________________________________________________________________  WHAT IS A BLOOD TRANSFUSION? Blood Transfusion Information  A transfusion is the replacement of blood or some of its parts. Blood is made up of multiple cells which provide different functions.  Red blood cells carry oxygen and are used for blood loss replacement.  White blood cells fight against infection.  Platelets control bleeding.  Plasma helps clot blood.  Other blood products are available for specialized needs, such as hemophilia or other clotting disorders. BEFORE THE TRANSFUSION  Who gives blood for transfusions?   Healthy volunteers who are fully evaluated to make sure their blood is safe. This is blood bank blood. Transfusion therapy is the safest  it has ever been in the practice of medicine. Before blood is taken from a donor, a complete history is taken to make sure that person has no history of diseases nor engages in risky social behavior (examples are intravenous drug use or sexual activity with multiple partners). The donor's travel history is screened to minimize risk of transmitting infections, such as malaria. The donated blood is tested for signs of infectious diseases, such as HIV and hepatitis. The blood is then tested to be sure it is compatible with you in order to minimize the chance of a transfusion reaction. If you or a relative donates blood, this is often done in anticipation of surgery and is not appropriate for emergency situations. It takes many days to process the donated blood. RISKS AND COMPLICATIONS Although transfusion therapy is very safe and saves many lives, the main dangers of transfusion include:   Getting an infectious disease.  Developing a transfusion reaction. This is an allergic reaction to something in the blood you were given. Every precaution is taken to prevent this. The decision to have a blood transfusion has been considered carefully by your caregiver before blood is given. Blood is not given unless the benefits outweigh the risks. AFTER THE TRANSFUSION  Right after receiving a blood transfusion, you will usually  feel much better and more energetic. This is especially true if your red blood cells have gotten low (anemic). The transfusion raises the level of the red blood cells which carry oxygen, and this usually causes an energy increase.  The nurse administering the transfusion will monitor you carefully for complications. HOME CARE INSTRUCTIONS  No special instructions are needed after a transfusion. You may find your energy is better. Speak with your caregiver about any limitations on activity for underlying diseases you may have. SEEK MEDICAL CARE IF:   Your condition is not improving after  your transfusion.  You develop redness or irritation at the intravenous (IV) site. SEEK IMMEDIATE MEDICAL CARE IF:  Any of the following symptoms occur over the next 12 hours:  Shaking chills.  You have a temperature by mouth above 102 F (38.9 C), not controlled by medicine.  Chest, back, or muscle pain.  People around you feel you are not acting correctly or are confused.  Shortness of breath or difficulty breathing.  Dizziness and fainting.  You get a rash or develop hives.  You have a decrease in urine output.  Your urine turns a dark color or changes to pink, red, or brown. Any of the following symptoms occur over the next 10 days:  You have a temperature by mouth above 102 F (38.9 C), not controlled by medicine.  Shortness of breath.  Weakness after normal activity.  The white part of the eye turns yellow (jaundice).  You have a decrease in the amount of urine or are urinating less often.  Your urine turns a dark color or changes to pink, red, or brown. Document Released: 04/30/2000 Document Revised: 07/26/2011 Document Reviewed: 12/18/2007 [facility] Patient Information 2014 [place], [place].  _______________________________________________________________________

## 2016-04-01 NOTE — Progress Notes (Signed)
EKG per chart 03/16/2016

## 2016-04-05 ENCOUNTER — Inpatient Hospital Stay (HOSPITAL_COMMUNITY): Payer: Medicare Other | Admitting: Anesthesiology

## 2016-04-05 ENCOUNTER — Encounter (HOSPITAL_COMMUNITY): Payer: Self-pay | Admitting: *Deleted

## 2016-04-05 ENCOUNTER — Inpatient Hospital Stay (HOSPITAL_COMMUNITY)
Admission: RE | Admit: 2016-04-05 | Discharge: 2016-04-06 | DRG: 465 | Disposition: A | Discharge status: Home Health | Payer: Medicare Other | Source: Ambulatory Visit | Attending: Orthopedic Surgery | Admitting: Orthopedic Surgery

## 2016-04-05 ENCOUNTER — Encounter (HOSPITAL_COMMUNITY)
Admission: RE | Disposition: A | Discharge status: Home Health | Payer: Self-pay | Source: Ambulatory Visit | Attending: Orthopedic Surgery

## 2016-04-05 DIAGNOSIS — Z981 Arthrodesis status: Secondary | ICD-10-CM

## 2016-04-05 DIAGNOSIS — Y792 Prosthetic and other implants, materials and accessory orthopedic devices associated with adverse incidents: Secondary | ICD-10-CM | POA: Diagnosis present

## 2016-04-05 DIAGNOSIS — M797 Fibromyalgia: Secondary | ICD-10-CM | POA: Diagnosis present

## 2016-04-05 DIAGNOSIS — M25861 Other specified joint disorders, right knee: Secondary | ICD-10-CM | POA: Diagnosis present

## 2016-04-05 DIAGNOSIS — Z8249 Family history of ischemic heart disease and other diseases of the circulatory system: Secondary | ICD-10-CM

## 2016-04-05 DIAGNOSIS — M238X1 Other internal derangements of right knee: Secondary | ICD-10-CM | POA: Diagnosis present

## 2016-04-05 DIAGNOSIS — T8489XA Other specified complication of internal orthopedic prosthetic devices, implants and grafts, initial encounter: Principal | ICD-10-CM | POA: Diagnosis present

## 2016-04-05 DIAGNOSIS — Z96659 Presence of unspecified artificial knee joint: Secondary | ICD-10-CM

## 2016-04-05 DIAGNOSIS — M1711 Unilateral primary osteoarthritis, right knee: Secondary | ICD-10-CM | POA: Diagnosis present

## 2016-04-05 DIAGNOSIS — Z6835 Body mass index (BMI) 35.0-35.9, adult: Secondary | ICD-10-CM

## 2016-04-05 DIAGNOSIS — Z800 Family history of malignant neoplasm of digestive organs: Secondary | ICD-10-CM

## 2016-04-05 DIAGNOSIS — R079 Chest pain, unspecified: Secondary | ICD-10-CM

## 2016-04-05 HISTORY — PX: TOTAL KNEE REVISION WITH SCAR DEBRIDEMENT/PATELLA REVISION WITH POLY EXCHANGE: SHX6128

## 2016-04-05 LAB — TYPE AND SCREEN
ABO/RH(D): A POS
Antibody Screen: NEGATIVE

## 2016-04-05 SURGERY — TOTAL KNEE REVISION WITH SCAR DEBRIDEMENT/PATELLA REVISION WITH POLY EXCHANGE
Anesthesia: General | Site: Knee | Laterality: Right

## 2016-04-05 MED ORDER — BUPROPION HCL ER (XL) 300 MG PO TB24
300.0000 mg | ORAL_TABLET | Freq: Every morning | ORAL | Status: DC
  Administered 2016-04-06: 300 mg via ORAL
  Filled 2016-04-05: qty 1

## 2016-04-05 MED ORDER — FENTANYL CITRATE (PF) 100 MCG/2ML IJ SOLN
INTRAMUSCULAR | Status: AC
  Filled 2016-04-05: qty 2

## 2016-04-05 MED ORDER — ONDANSETRON HCL 4 MG/2ML IJ SOLN
INTRAMUSCULAR | Status: DC | PRN
  Administered 2016-04-05: 4 mg via INTRAVENOUS

## 2016-04-05 MED ORDER — MENTHOL 3 MG MT LOZG: 1.0000 | LOZENGE | OROMUCOSAL | Status: DC | PRN

## 2016-04-05 MED ORDER — HYDROMORPHONE HCL 1 MG/ML IJ SOLN
0.2500 mg | INTRAMUSCULAR | Status: DC | PRN
  Administered 2016-04-05 (×4): 0.5 mg via INTRAVENOUS

## 2016-04-05 MED ORDER — CEFAZOLIN SODIUM-DEXTROSE 2-4 GM/100ML-% IV SOLN
INTRAVENOUS | Status: AC
  Filled 2016-04-05: qty 100

## 2016-04-05 MED ORDER — SODIUM CHLORIDE 0.9 % IJ SOLN
INTRAMUSCULAR | Status: AC
  Filled 2016-04-05: qty 50

## 2016-04-05 MED ORDER — SODIUM CHLORIDE 0.9 % IV SOLN
INTRAVENOUS | Status: DC
  Administered 2016-04-05 – 2016-04-06 (×2): via INTRAVENOUS
  Filled 2016-04-05 (×5): qty 1000

## 2016-04-05 MED ORDER — FENTANYL CITRATE (PF) 100 MCG/2ML IJ SOLN
INTRAMUSCULAR | Status: DC | PRN
  Administered 2016-04-05 (×8): 50 ug via INTRAVENOUS

## 2016-04-05 MED ORDER — ROPINIROLE HCL 1 MG PO TABS
1.0000 mg | ORAL_TABLET | Freq: Every day | ORAL | Status: DC
  Administered 2016-04-05: 1 mg via ORAL
  Filled 2016-04-05: qty 1

## 2016-04-05 MED ORDER — LIDOCAINE 2% (20 MG/ML) 5 ML SYRINGE
INTRAMUSCULAR | Status: AC
  Filled 2016-04-05: qty 5

## 2016-04-05 MED ORDER — DEXAMETHASONE SODIUM PHOSPHATE 10 MG/ML IJ SOLN
10.0000 mg | Freq: Once | INTRAMUSCULAR | Status: AC
  Administered 2016-04-05: 10 mg via INTRAVENOUS

## 2016-04-05 MED ORDER — DEXAMETHASONE SODIUM PHOSPHATE 10 MG/ML IJ SOLN
10.0000 mg | Freq: Once | INTRAMUSCULAR | Status: AC
  Administered 2016-04-06: 10 mg via INTRAVENOUS
  Filled 2016-04-05: qty 1

## 2016-04-05 MED ORDER — SUCCINYLCHOLINE CHLORIDE 200 MG/10ML IV SOSY
PREFILLED_SYRINGE | INTRAVENOUS | Status: AC
  Filled 2016-04-05: qty 10

## 2016-04-05 MED ORDER — DEXAMETHASONE SODIUM PHOSPHATE 10 MG/ML IJ SOLN
INTRAMUSCULAR | Status: AC
  Filled 2016-04-05: qty 1

## 2016-04-05 MED ORDER — STERILE WATER FOR IRRIGATION IR SOLN
Status: DC | PRN
  Administered 2016-04-05: 1500 mL

## 2016-04-05 MED ORDER — BUPIVACAINE HCL (PF) 0.25 % IJ SOLN
INTRAMUSCULAR | Status: AC
  Filled 2016-04-05: qty 30

## 2016-04-05 MED ORDER — LACTATED RINGERS IV SOLN
INTRAVENOUS | Status: DC
  Administered 2016-04-05: 1000 mL via INTRAVENOUS

## 2016-04-05 MED ORDER — BUPIVACAINE HCL 0.25 % IJ SOLN
INTRAMUSCULAR | Status: DC | PRN
  Administered 2016-04-05: 30 mL

## 2016-04-05 MED ORDER — MIDAZOLAM HCL 2 MG/2ML IJ SOLN
0.5000 mg | Freq: Once | INTRAMUSCULAR | Status: AC | PRN
  Administered 2016-04-05: 0.5 mg via INTRAVENOUS

## 2016-04-05 MED ORDER — KETOROLAC TROMETHAMINE 30 MG/ML IJ SOLN
INTRAMUSCULAR | Status: AC
  Filled 2016-04-05: qty 1

## 2016-04-05 MED ORDER — CEFAZOLIN SODIUM-DEXTROSE 2-4 GM/100ML-% IV SOLN
2.0000 g | INTRAVENOUS | Status: AC
  Administered 2016-04-05: 2 g via INTRAVENOUS

## 2016-04-05 MED ORDER — DOCUSATE SODIUM 100 MG PO CAPS
100.0000 mg | ORAL_CAPSULE | Freq: Two times a day (BID) | ORAL | Status: DC
  Administered 2016-04-05 – 2016-04-06 (×2): 100 mg via ORAL
  Filled 2016-04-05 (×2): qty 1

## 2016-04-05 MED ORDER — MIDAZOLAM HCL 2 MG/2ML IJ SOLN
INTRAMUSCULAR | Status: AC
  Administered 2016-04-05: 0.5 mg via INTRAVENOUS
  Filled 2016-04-05: qty 2

## 2016-04-05 MED ORDER — HYDROXYZINE HCL 25 MG PO TABS: 50.0000 mg | ORAL_TABLET | Freq: Two times a day (BID) | ORAL | Status: DC | PRN

## 2016-04-05 MED ORDER — CEFAZOLIN SODIUM-DEXTROSE 2-4 GM/100ML-% IV SOLN
2.0000 g | Freq: Four times a day (QID) | INTRAVENOUS | Status: AC
  Administered 2016-04-05 (×2): 2 g via INTRAVENOUS
  Filled 2016-04-05 (×2): qty 100

## 2016-04-05 MED ORDER — MIDAZOLAM HCL 5 MG/5ML IJ SOLN
INTRAMUSCULAR | Status: DC | PRN
  Administered 2016-04-05: 2 mg via INTRAVENOUS

## 2016-04-05 MED ORDER — CELECOXIB 200 MG PO CAPS
200.0000 mg | ORAL_CAPSULE | Freq: Two times a day (BID) | ORAL | Status: DC
  Administered 2016-04-05 – 2016-04-06 (×2): 200 mg via ORAL
  Filled 2016-04-05 (×2): qty 1

## 2016-04-05 MED ORDER — ONDANSETRON HCL 4 MG PO TABS: 4.0000 mg | ORAL_TABLET | Freq: Four times a day (QID) | ORAL | Status: DC | PRN

## 2016-04-05 MED ORDER — TOPIRAMATE 25 MG PO TABS
50.0000 mg | ORAL_TABLET | Freq: Every day | ORAL | Status: DC
  Administered 2016-04-06: 50 mg via ORAL
  Filled 2016-04-05: qty 2

## 2016-04-05 MED ORDER — TOPIRAMATE 25 MG PO TABS
25.0000 mg | ORAL_TABLET | Freq: Every day | ORAL | Status: DC
  Administered 2016-04-05: 25 mg via ORAL
  Filled 2016-04-05: qty 1

## 2016-04-05 MED ORDER — HYDROMORPHONE HCL 1 MG/ML IJ SOLN
INTRAMUSCULAR | Status: AC
  Administered 2016-04-05: 0.5 mg via INTRAVENOUS
  Filled 2016-04-05: qty 1

## 2016-04-05 MED ORDER — MAGNESIUM CITRATE PO SOLN
1.0000 | Freq: Once | ORAL | Status: DC | PRN
Start: 2016-04-05 — End: 2016-04-06

## 2016-04-05 MED ORDER — METOCLOPRAMIDE HCL 5 MG PO TABS
5.0000 mg | ORAL_TABLET | Freq: Three times a day (TID) | ORAL | Status: DC | PRN
  Administered 2016-04-06: 10 mg via ORAL
  Filled 2016-04-05: qty 2

## 2016-04-05 MED ORDER — POLYETHYLENE GLYCOL 3350 17 G PO PACK
17.0000 g | PACK | Freq: Two times a day (BID) | ORAL | Status: DC
  Filled 2016-04-05 (×2): qty 1

## 2016-04-05 MED ORDER — METHOCARBAMOL 500 MG PO TABS
500.0000 mg | ORAL_TABLET | Freq: Four times a day (QID) | ORAL | Status: DC | PRN
Start: 2016-04-05 — End: 2016-04-06
  Administered 2016-04-06 (×2): 500 mg via ORAL
  Filled 2016-04-05 (×2): qty 1

## 2016-04-05 MED ORDER — OXYCODONE HCL 5 MG PO TABS
ORAL_TABLET | ORAL | Status: AC
  Filled 2016-04-05: qty 1

## 2016-04-05 MED ORDER — DIPHENHYDRAMINE HCL 25 MG PO CAPS: 25.0000 mg | ORAL_CAPSULE | Freq: Four times a day (QID) | ORAL | Status: DC | PRN

## 2016-04-05 MED ORDER — PROPOFOL 10 MG/ML IV BOLUS
INTRAVENOUS | Status: DC | PRN
  Administered 2016-04-05: 50 mg via INTRAVENOUS
  Administered 2016-04-05: 150 mg via INTRAVENOUS

## 2016-04-05 MED ORDER — OXYCODONE HCL 5 MG PO TABS
5.0000 mg | ORAL_TABLET | Freq: Once | ORAL | Status: AC | PRN
  Administered 2016-04-05: 5 mg via ORAL

## 2016-04-05 MED ORDER — SODIUM CHLORIDE 0.9 % IR SOLN
Status: DC | PRN
  Administered 2016-04-05: 1000 mL

## 2016-04-05 MED ORDER — ALUM & MAG HYDROXIDE-SIMETH 200-200-20 MG/5ML PO SUSP: 30.0000 mL | ORAL | Status: DC | PRN

## 2016-04-05 MED ORDER — ONDANSETRON HCL 4 MG/2ML IJ SOLN
INTRAMUSCULAR | Status: AC
  Filled 2016-04-05: qty 2

## 2016-04-05 MED ORDER — METHOCARBAMOL 1000 MG/10ML IJ SOLN
500.0000 mg | Freq: Four times a day (QID) | INTRAVENOUS | Status: DC | PRN
  Administered 2016-04-05: 500 mg via INTRAVENOUS
  Filled 2016-04-05: qty 5
  Filled 2016-04-05: qty 550

## 2016-04-05 MED ORDER — MIDAZOLAM HCL 2 MG/2ML IJ SOLN
0.5000 mg | INTRAMUSCULAR | Status: DC | PRN
  Administered 2016-04-05: 0.5 mg via INTRAVENOUS

## 2016-04-05 MED ORDER — MORPHINE SULFATE 15 MG PO TABS
15.0000 mg | ORAL_TABLET | ORAL | Status: DC | PRN
  Filled 2016-04-05: qty 1

## 2016-04-05 MED ORDER — LIDOCAINE 2% (20 MG/ML) 5 ML SYRINGE
INTRAMUSCULAR | Status: DC | PRN
  Administered 2016-04-05: 60 mg via INTRAVENOUS

## 2016-04-05 MED ORDER — ONDANSETRON HCL 4 MG/2ML IJ SOLN
4.0000 mg | Freq: Four times a day (QID) | INTRAMUSCULAR | Status: DC | PRN
  Administered 2016-04-05: 4 mg via INTRAVENOUS
  Filled 2016-04-05 (×2): qty 2

## 2016-04-05 MED ORDER — ASPIRIN EC 325 MG PO TBEC
325.0000 mg | DELAYED_RELEASE_TABLET | Freq: Two times a day (BID) | ORAL | Status: DC
  Administered 2016-04-06: 325 mg via ORAL
  Filled 2016-04-05: qty 1

## 2016-04-05 MED ORDER — FERROUS SULFATE 325 (65 FE) MG PO TABS
325.0000 mg | ORAL_TABLET | Freq: Three times a day (TID) | ORAL | Status: DC
  Administered 2016-04-06: 325 mg via ORAL
  Filled 2016-04-05: qty 1

## 2016-04-05 MED ORDER — BISACODYL 10 MG RE SUPP: 10.0000 mg | Freq: Every day | RECTAL | Status: DC | PRN

## 2016-04-05 MED ORDER — ALBUTEROL SULFATE ER 4 MG PO TB12
16.0000 mg | ORAL_TABLET | Freq: Two times a day (BID) | ORAL | Status: DC
  Administered 2016-04-05 – 2016-04-06 (×2): 16 mg via ORAL
  Filled 2016-04-05 (×3): qty 4

## 2016-04-05 MED ORDER — KETOROLAC TROMETHAMINE 30 MG/ML IJ SOLN
INTRAMUSCULAR | Status: DC | PRN
  Administered 2016-04-05: 30 mg

## 2016-04-05 MED ORDER — MIDAZOLAM HCL 2 MG/2ML IJ SOLN
INTRAMUSCULAR | Status: AC
  Filled 2016-04-05: qty 2

## 2016-04-05 MED ORDER — TRANEXAMIC ACID 1000 MG/10ML IV SOLN
1000.0000 mg | INTRAVENOUS | Status: AC
  Administered 2016-04-05: 1000 mg via INTRAVENOUS
  Filled 2016-04-05: qty 1100

## 2016-04-05 MED ORDER — OXYCODONE HCL 5 MG/5ML PO SOLN
5.0000 mg | Freq: Once | ORAL | Status: AC | PRN
  Filled 2016-04-05: qty 5

## 2016-04-05 MED ORDER — METOCLOPRAMIDE HCL 5 MG/ML IJ SOLN
5.0000 mg | Freq: Three times a day (TID) | INTRAMUSCULAR | Status: DC | PRN
  Administered 2016-04-05: 10 mg via INTRAVENOUS
  Filled 2016-04-05: qty 2

## 2016-04-05 MED ORDER — SODIUM CHLORIDE 0.9 % IJ SOLN
INTRAMUSCULAR | Status: DC | PRN
  Administered 2016-04-05: 30 mL

## 2016-04-05 MED ORDER — CHLORHEXIDINE GLUCONATE 4 % EX LIQD: 60.0000 mL | Freq: Once | CUTANEOUS | Status: DC

## 2016-04-05 MED ORDER — PHENOL 1.4 % MT LIQD
1.0000 | OROMUCOSAL | Status: DC | PRN
  Filled 2016-04-05: qty 177

## 2016-04-05 MED ORDER — OXYCODONE HCL 5 MG PO TABS
5.0000 mg | ORAL_TABLET | ORAL | Status: DC | PRN
  Administered 2016-04-05 – 2016-04-06 (×3): 10 mg via ORAL
  Filled 2016-04-05 (×5): qty 2

## 2016-04-05 MED ORDER — HYDROMORPHONE HCL 1 MG/ML IJ SOLN
0.5000 mg | INTRAMUSCULAR | Status: DC | PRN
  Administered 2016-04-05 (×2): 2 mg via INTRAVENOUS
  Filled 2016-04-05 (×2): qty 2

## 2016-04-05 MED ORDER — ONDANSETRON HCL 4 MG/2ML IJ SOLN: 4.0000 mg | Freq: Four times a day (QID) | INTRAMUSCULAR | Status: DC | PRN

## 2016-04-05 SURGICAL SUPPLY — 65 items
ADH SKN CLS APL DERMABOND .7 (GAUZE/BANDAGES/DRESSINGS) ×1
BAG SPEC THK2 15X12 ZIP CLS (MISCELLANEOUS) ×1
BAG ZIPLOCK 12X15 (MISCELLANEOUS) ×3 IMPLANT
BANDAGE ACE 6X5 VEL STRL LF (GAUZE/BANDAGES/DRESSINGS) ×3 IMPLANT
BANDAGE ESMARK 6X9 LF (GAUZE/BANDAGES/DRESSINGS) ×1 IMPLANT
BLADE SAW SGTL 13.0X1.19X90.0M (BLADE) ×3 IMPLANT
BLADE SAW SGTL 81X20 HD (BLADE) ×3 IMPLANT
BNDG CMPR 9X6 STRL LF SNTH (GAUZE/BANDAGES/DRESSINGS) ×1
BNDG ESMARK 6X9 LF (GAUZE/BANDAGES/DRESSINGS) ×3
BOWL SMART MIX CTS (DISPOSABLE) ×2 IMPLANT
CEMENT HV SMART SET (Cement) ×6 IMPLANT
CUFF TOURN SGL QUICK 34 (TOURNIQUET CUFF) ×3
CUFF TRNQT CYL 34X4X40X1 (TOURNIQUET CUFF) ×1 IMPLANT
DERMABOND ADVANCED (GAUZE/BANDAGES/DRESSINGS) ×2
DERMABOND ADVANCED .7 DNX12 (GAUZE/BANDAGES/DRESSINGS) ×1 IMPLANT
DRAPE EXTREMITY T 121X128X90 (DRAPE) ×3 IMPLANT
DRAPE POUCH INSTRU U-SHP 10X18 (DRAPES) ×3 IMPLANT
DRAPE U-SHAPE 47X51 STRL (DRAPES) ×3 IMPLANT
DRESSING AQUACEL AG SP 3.5X10 (GAUZE/BANDAGES/DRESSINGS) IMPLANT
DRSG ADAPTIC 3X8 NADH LF (GAUZE/BANDAGES/DRESSINGS) ×1 IMPLANT
DRSG AQUACEL AG ADV 3.5X10 (GAUZE/BANDAGES/DRESSINGS) ×3 IMPLANT
DRSG AQUACEL AG SP 3.5X10 (GAUZE/BANDAGES/DRESSINGS)
DRSG PAD ABDOMINAL 8X10 ST (GAUZE/BANDAGES/DRESSINGS) ×3 IMPLANT
DURAPREP 26ML APPLICATOR (WOUND CARE) ×3 IMPLANT
ELECT REM PT RETURN 9FT ADLT (ELECTROSURGICAL) ×3
ELECTRODE REM PT RTRN 9FT ADLT (ELECTROSURGICAL) ×1 IMPLANT
FACESHIELD WRAPAROUND (MASK) ×15 IMPLANT
FACESHIELD WRAPAROUND OR TEAM (MASK) ×5 IMPLANT
GAUZE SPONGE 4X4 12PLY STRL (GAUZE/BANDAGES/DRESSINGS) ×2 IMPLANT
GLOVE BIOGEL M 7.0 STRL (GLOVE) IMPLANT
GLOVE BIOGEL PI IND STRL 7.5 (GLOVE) ×1 IMPLANT
GLOVE BIOGEL PI IND STRL 8.5 (GLOVE) ×2 IMPLANT
GLOVE BIOGEL PI INDICATOR 7.5 (GLOVE) ×10
GLOVE BIOGEL PI INDICATOR 8.5 (GLOVE) ×4
GLOVE ECLIPSE 8.0 STRL XLNG CF (GLOVE) ×5 IMPLANT
GLOVE ORTHO TXT STRL SZ7.5 (GLOVE) ×6 IMPLANT
GOWN STRL REUS W/TWL LRG LVL3 (GOWN DISPOSABLE) ×5 IMPLANT
GOWN STRL REUS W/TWL XL LVL3 (GOWN DISPOSABLE) ×6 IMPLANT
HANDPIECE INTERPULSE COAX TIP (DISPOSABLE) ×3
IMMOBILIZER KNEE 20 (SOFTGOODS)
IMMOBILIZER KNEE 20 THIGH 36 (SOFTGOODS) IMPLANT
MANIFOLD NEPTUNE II (INSTRUMENTS) ×3 IMPLANT
NDL SAFETY ECLIPSE 18X1.5 (NEEDLE) ×2 IMPLANT
NEEDLE HYPO 18GX1.5 SHARP (NEEDLE) ×6
NS IRRIG 1000ML POUR BTL (IV SOLUTION) ×3 IMPLANT
PADDING CAST COTTON 6X4 STRL (CAST SUPPLIES) ×6 IMPLANT
PATELLA DOME PFC 41MM (Knees) ×2 IMPLANT
PLATE ROT INSERT 12.5MM SIZE 4 (Plate) ×2 IMPLANT
POSITIONER SURGICAL ARM (MISCELLANEOUS) ×3 IMPLANT
SET HNDPC FAN SPRY TIP SCT (DISPOSABLE) ×1 IMPLANT
SET PAD KNEE POSITIONER (MISCELLANEOUS) ×3 IMPLANT
SPONGE LAP 18X18 X RAY DECT (DISPOSABLE) ×1 IMPLANT
STAPLER VISISTAT 35W (STAPLE) IMPLANT
SUCTION FRAZIER HANDLE 12FR (TUBING) ×2
SUCTION TUBE FRAZIER 12FR DISP (TUBING) ×1 IMPLANT
SUT VIC AB 1 CT1 36 (SUTURE) ×9 IMPLANT
SUT VIC AB 2-0 CT1 27 (SUTURE) ×9
SUT VIC AB 2-0 CT1 TAPERPNT 27 (SUTURE) ×3 IMPLANT
SYR 50ML LL SCALE MARK (SYRINGE) ×3 IMPLANT
TOWEL OR 17X26 10 PK STRL BLUE (TOWEL DISPOSABLE) ×6 IMPLANT
TOWER CARTRIDGE SMART MIX (DISPOSABLE) ×1 IMPLANT
TRAY FOLEY W/METER SILVER 16FR (SET/KITS/TRAYS/PACK) ×3 IMPLANT
WATER STERILE IRR 1500ML POUR (IV SOLUTION) ×3 IMPLANT
WRAP KNEE MAXI GEL POST OP (GAUZE/BANDAGES/DRESSINGS) ×3 IMPLANT
YANKAUER SUCT BULB TIP 10FT TU (MISCELLANEOUS) IMPLANT

## 2016-04-05 NOTE — Interval H&P Note (Signed)
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 After consideration of risks, benefits and other options for treatment, the patient has consented to  Procedure(s): OPEN SCAR DEBRIDEMENT WITH POSSIBLE POLY EXCHANGE (Right) as a surgical intervention .  The patient's history has been reviewed, patient examined, no change in status, stable for surgery.  I have reviewed the patient's chart and labs.  Questions were answered to the patient's satisfaction.     Shelda PalLIN,Shayann Garbutt D

## 2016-04-05 NOTE — Anesthesia Procedure Notes (Signed)
Procedure Name: LMA Insertion Date/Time: 04/05/2016 12:39 PM Performed by: Delphia GratesHANDLER, Hristopher Missildine Pre-anesthesia Checklist: Patient identified, Emergency Drugs available, Suction available and Patient being monitored Patient Re-evaluated:Patient Re-evaluated prior to inductionOxygen Delivery Method: Circle system utilized Preoxygenation: Pre-oxygenation with 100% oxygen Intubation Type: IV induction LMA: LMA inserted LMA Size: 4.0 Number of attempts: 1 Placement Confirmation: ETT inserted through vocal cords under direct vision,  CO2 detector and breath sounds checked- equal and bilateral Tube secured with: Tape Dental Injury: Teeth and Oropharynx as per pre-operative assessment

## 2016-04-05 NOTE — Transfer of Care (Signed)
Immediate Anesthesia Transfer of Care Note  Patient: [name]  Procedure(s) Performed: Procedure(s): OPEN SCAR DEBRIDEMENT WITH POLY EXCHANGE reviosion of patella (Right)  Patient Location: PACU  Anesthesia Type:General  Level of Consciousness: awake, alert , oriented and patient cooperative  Airway & Oxygen Therapy: Patient Spontanous Breathing and Patient connected to face mask oxygen  Post-op Assessment: Report given to RN and Post -op Vital signs reviewed and stable  Post vital signs: Reviewed and stable  Last Vitals:  Vitals:   04/05/16 1053 04/05/16 1415  BP: 130/77 (!) 146/87  Pulse: 78 84  Resp: 18 14  Temp: 36.7 C 36.9 C    Last Pain:  Vitals:   04/05/16 1053  TempSrc: Oral      Patients Stated Pain Goal: 4 (04/05/16 1149)  Complications: No apparent anesthesia complications

## 2016-04-05 NOTE — Brief Op Note (Signed)
04/05/2016  1:41 PM  PATIENT:  Tara FullingPatricia R Burch  55 y.o. 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  OPEN SCAR DEBRIDEMENT WITH  2.  POLYethylene revision 3.  revision of patella (Right) 35 to 41  SURGEON:  Surgeon(s) and Role:    * Durene RomansMatthew Isaah Furry, MD - Primary  PHYSICIAN ASSISTANT: Lanney GinsMatthew Babish, PA-C  ANESTHESIA:   spinal  EBL:  Total I/O In: 700 [I.V.:700] Out: 150 [Urine:100; Blood:50]  BLOOD ADMINISTERED:none  DRAINS: none   LOCAL MEDICATIONS USED:  MARCAINE     SPECIMEN:  No Specimen  DISPOSITION OF SPECIMEN:  N/A  COUNTS:  YES  TOURNIQUET:   Total Tourniquet Time Documented: Thigh (Right) - 23 minutes Total: Thigh (Right) - 23 minutes   DICTATION: .Other Dictation: Dictation Number [id number]  PLAN OF CARE: Admit to inpatient   PATIENT DISPOSITION:  PACU - hemodynamically stable.   Delay start of Pharmacological VTE agent (>24hrs) due to surgical blood loss or risk of bleeding: no

## 2016-04-05 NOTE — Anesthesia Postprocedure Evaluation (Signed)
Anesthesia Post Note  Patient: Linnell Fullingatricia R Tavenner  Procedure(s) Performed: Procedure(s) (LRB): OPEN SCAR DEBRIDEMENT WITH POLY EXCHANGE reviosion of patella (Right)  Patient location during evaluation: PACU Anesthesia Type: General Level of consciousness: awake and alert and patient cooperative Pain management: pain level controlled Vital Signs Assessment: post-procedure vital signs reviewed and stable Respiratory status: spontaneous breathing and respiratory function stable Cardiovascular status: stable Anesthetic complications: no    Last Vitals:  Vitals:   04/05/16 1445 04/05/16 1500  BP: 125/74 123/73  Pulse: 80 80  Resp: 12 19  Temp:  36.8 C    Last Pain:  Vitals:   04/05/16 1445  TempSrc:   PainSc: 10-Worst pain ever                 Iolani Twilley S

## 2016-04-05 NOTE — Anesthesia Preprocedure Evaluation (Signed)
Anesthesia Evaluation  Patient identified by MRN, date of birth, ID band Patient awake    Reviewed: Allergy & Precautions, H&P , NPO status , Patient's Chart, lab work & pertinent test results  History of Anesthesia Complications (+) PONV and history of anesthetic complications  Airway Mallampati: II   Neck ROM: full    Dental   Pulmonary shortness of breath,    breath sounds clear to auscultation       Cardiovascular negative cardio ROS   Rhythm:regular Rate:Normal     Neuro/Psych  Headaches, PSYCHIATRIC DISORDERS Anxiety Depression    GI/Hepatic   Endo/Other    Renal/GU Only one functioning kidney     Musculoskeletal  (+) Arthritis , Fibromyalgia -  Abdominal   Peds  Hematology   Anesthesia Other Findings   Reproductive/Obstetrics                             Anesthesia Physical Anesthesia Plan  ASA: II  Anesthesia Plan: General   Post-op Pain Management:    Induction: Intravenous  Airway Management Planned: LMA  Additional Equipment:   Intra-op Plan:   Post-operative Plan:   Informed Consent: I have reviewed the patients History and Physical, chart, labs and discussed the procedure including the risks, benefits and alternatives for the proposed anesthesia with the patient or authorized representative who has indicated his/her understanding and acceptance.     Plan Discussed with: CRNA, Anesthesiologist and Surgeon  Anesthesia Plan Comments:         Anesthesia Quick Evaluation

## 2016-04-06 ENCOUNTER — Observation Stay (HOSPITAL_COMMUNITY): Payer: Medicare Other

## 2016-04-06 ENCOUNTER — Encounter (HOSPITAL_COMMUNITY): Payer: Self-pay | Admitting: Orthopedic Surgery

## 2016-04-06 DIAGNOSIS — M1711 Unilateral primary osteoarthritis, right knee: Secondary | ICD-10-CM | POA: Diagnosis not present

## 2016-04-06 DIAGNOSIS — M25861 Other specified joint disorders, right knee: Secondary | ICD-10-CM | POA: Diagnosis not present

## 2016-04-06 DIAGNOSIS — Z800 Family history of malignant neoplasm of digestive organs: Secondary | ICD-10-CM | POA: Diagnosis not present

## 2016-04-06 DIAGNOSIS — Z981 Arthrodesis status: Secondary | ICD-10-CM | POA: Diagnosis not present

## 2016-04-06 DIAGNOSIS — Y792 Prosthetic and other implants, materials and accessory orthopedic devices associated with adverse incidents: Secondary | ICD-10-CM | POA: Diagnosis present

## 2016-04-06 DIAGNOSIS — M238X1 Other internal derangements of right knee: Secondary | ICD-10-CM | POA: Diagnosis not present

## 2016-04-06 DIAGNOSIS — M797 Fibromyalgia: Secondary | ICD-10-CM | POA: Diagnosis not present

## 2016-04-06 DIAGNOSIS — T8489XA Other specified complication of internal orthopedic prosthetic devices, implants and grafts, initial encounter: Secondary | ICD-10-CM | POA: Diagnosis not present

## 2016-04-06 DIAGNOSIS — Z8249 Family history of ischemic heart disease and other diseases of the circulatory system: Secondary | ICD-10-CM | POA: Diagnosis not present

## 2016-04-06 DIAGNOSIS — Z6835 Body mass index (BMI) 35.0-35.9, adult: Secondary | ICD-10-CM | POA: Diagnosis not present

## 2016-04-06 LAB — CBC
HCT: 31.6 % — ABNORMAL LOW (ref 36.0–46.0)
Hemoglobin: 9.9 g/dL — ABNORMAL LOW (ref 12.0–15.0)
MCH: 27.1 pg (ref 26.0–34.0)
MCHC: 31.3 g/dL (ref 30.0–36.0)
MCV: 86.6 fL (ref 78.0–100.0)
PLATELETS: 311 10*3/uL (ref 150–400)
RBC: 3.65 MIL/uL — ABNORMAL LOW (ref 3.87–5.11)
RDW: 13.8 % (ref 11.5–15.5)
WBC: 15 10*3/uL — ABNORMAL HIGH (ref 4.0–10.5)

## 2016-04-06 LAB — BASIC METABOLIC PANEL
Anion gap: 5 (ref 5–15)
BUN: 17 mg/dL (ref 6–20)
CO2: 21 mmol/L — ABNORMAL LOW (ref 22–32)
Calcium: 8.5 mg/dL — ABNORMAL LOW (ref 8.9–10.3)
Chloride: 112 mmol/L — ABNORMAL HIGH (ref 101–111)
Creatinine, Ser: 0.77 mg/dL (ref 0.44–1.00)
GFR calc Af Amer: >60 mL/min (ref >60)
GLUCOSE: 183 mg/dL — AB (ref 65–99)
POTASSIUM: 4 mmol/L (ref 3.5–5.1)
Sodium: 138 mmol/L (ref 135–145)

## 2016-04-06 MED ORDER — DOCUSATE SODIUM 100 MG PO CAPS: 100.0000 mg | ORAL_CAPSULE | Freq: Two times a day (BID) | ORAL | 0 refills | Status: DC

## 2016-04-06 MED ORDER — MORPHINE SULFATE 15 MG PO TABS
15.0000 mg | ORAL_TABLET | ORAL | Status: DC | PRN
  Administered 2016-04-06: 30 mg via ORAL
  Filled 2016-04-06 (×2): qty 2

## 2016-04-06 MED ORDER — FERROUS SULFATE 325 (65 FE) MG PO TABS: 325.0000 mg | ORAL_TABLET | Freq: Three times a day (TID) | ORAL | 3 refills | Status: DC

## 2016-04-06 MED ORDER — POLYETHYLENE GLYCOL 3350 17 G PO PACK: 17.0000 g | PACK | Freq: Two times a day (BID) | ORAL | 0 refills | Status: DC

## 2016-04-06 MED ORDER — ASPIRIN 325 MG PO TBEC: 325.0000 mg | DELAYED_RELEASE_TABLET | Freq: Two times a day (BID) | ORAL | 0 refills | Status: DC

## 2016-04-06 MED ORDER — METHOCARBAMOL 500 MG PO TABS: 500.0000 mg | ORAL_TABLET | Freq: Four times a day (QID) | ORAL | 0 refills | Status: DC | PRN

## 2016-04-06 MED ORDER — MORPHINE SULFATE 15 MG PO TABS: 15.0000 mg | ORAL_TABLET | ORAL | 0 refills | Status: DC | PRN

## 2016-04-06 MED ORDER — CELECOXIB 200 MG PO CAPS: 200.0000 mg | ORAL_CAPSULE | Freq: Two times a day (BID) | ORAL | 0 refills | Status: AC

## 2016-04-06 MED ORDER — NITROGLYCERIN 0.4 MG SL SUBL
0.4000 mg | SUBLINGUAL_TABLET | Freq: Once | SUBLINGUAL | Status: AC
  Administered 2016-04-06: 0.4 mg via SUBLINGUAL
  Filled 2016-04-06: qty 1

## 2016-04-06 NOTE — Progress Notes (Signed)
Pt complain of chest pain and some numbness to left arm. She stated it feels like a muscle spasm. Vital signs 149/70 HR 93, oxygen saturation 100% on 2L, 98.4 oral temp, 18 Respiration. No signs of acute distress. Notified on-call PA Alphonsa OverallBrad Dixon. One dose of nitroglycerin SL order, EKG ordered, and Chest x-ray to be done in AM. Will continue to monitor.

## 2016-04-06 NOTE — Progress Notes (Signed)
Discharge planning, spoke with patient at bedside. Have chosen [facility] for HH PT. Contacted [facility] for referral. Has RW and 3-n-1 at home. [phone] 

## 2016-04-06 NOTE — Evaluation (Signed)
Physical Therapy Evaluation Patient Details Name: [name] Fullingatricia R Brand MRN: [id number] DOB: 04/04/1961 Today's Date: 04/06/2016   History of Present Illness  Pt is a 55 year old female s/p Right OPEN SCAR DEBRIDEMENT WITH POLY EXCHANGE revision of patella with hx of R TKA as well as multiple surgeries for shoulder, back, and neck  Clinical Impression  Pt is s/p TKA resulting in the deficits listed below (see PT Problem List).  Pt will benefit from skilled PT to increase their independence and safety with mobility to allow discharge to the venue listed below.  Pt ambulated in hallway and performed exercises.  Pt reports d/c home later today after second session, states her parents will be driving her home.        Follow Up Recommendations Home health PT    Equipment Recommendations  None recommended by PT    Recommendations for Other Services       Precautions / Restrictions Precautions Precautions: Knee Restrictions Other Position/Activity Restrictions: WBAT      Mobility  Bed Mobility Overal bed mobility: Modified Independent             General bed mobility comments: HOB elevated, pt donned undergarments and pants without assist  Transfers Overall transfer level: Needs assistance Equipment used: Rolling walker (2 wheeled) Transfers: Sit to/from Stand Sit to Stand: Min guard         General transfer comment: verbal cues for hand placement  Ambulation/Gait Ambulation/Gait assistance: Min guard Ambulation Distance (Feet): 160 Feet Assistive device: Rolling walker (2 wheeled) Gait Pattern/deviations: Step-through pattern;Decreased stance time - right;Antalgic     General Gait Details: pt utilizing RW well  Stairs            Wheelchair Mobility    Modified Rankin (Stroke Patients Only)       Balance                                             Pertinent Vitals/Pain Pain Assessment: 0-10 Pain Score:  (not rated, reports  constant chronic pain, reports tolerable) Pain Location: R knee Pain Descriptors / Indicators: Sore Pain Intervention(s): Limited activity within patient's tolerance;Monitored during session;Premedicated before session;Ice applied    Home Living Family/patient expects to be discharged to:: Private residence Living Arrangements: Alone Available Help at Discharge: Friend(s);Family Type of Home: Mobile home Home Access: Ramped entrance     Home Layout: One level Home Equipment: Walker - 2 wheels;Cane - single point      Prior Function Level of Independence: Independent with assistive device(s)         Comments: reports hx of multiple back surgeries and LEs weakness with leg buckling causing numerous falls     Hand Dominance        Extremity/Trunk Assessment               Lower Extremity Assessment: RLE deficits/detail RLE Deficits / Details: unable to perform SLR, knee flexion AAROM 35*       Communication   Communication: No difficulties  Cognition Arousal/Alertness: Awake/alert Behavior During Therapy: WFL for tasks assessed/performed Overall Cognitive Status: Within Functional Limits for tasks assessed                      General Comments      Exercises Total Joint Exercises Ankle Circles/Pumps: AROM;Both;10 reps Quad Sets: AROM;10 reps;Right Short Arc  Quad: AAROM;Right;10 reps Heel Slides: AAROM;Right;10 reps Hip ABduction/ADduction: AAROM;Right;10 reps Straight Leg Raises: AAROM;Right;10 reps   Assessment/Plan    PT Assessment Patient needs continued PT services  PT Problem List Decreased strength;Decreased range of motion;Pain;Decreased mobility          PT Treatment Interventions Functional mobility training;Gait training;DME instruction;Therapeutic activities;Therapeutic exercise;Patient/family education    PT Goals (Current goals can be found in the Care Plan section)  Acute Rehab PT Goals PT Goal Formulation: With  patient Time For Goal Achievement: 04/09/16 Potential to Achieve Goals: Good    Frequency 7X/week   Barriers to discharge        Co-evaluation               End of Session Equipment Utilized During Treatment: Gait belt Activity Tolerance: Patient tolerated treatment well Patient left: in chair;with call bell/phone within reach;with nursing/sitter in room           Time: 0941-1010 PT Time Calculation (min) (ACUTE ONLY): 29 min   Charges:   PT Evaluation $PT Eval Low Complexity: 1 Procedure PT Treatments $Therapeutic Exercise: 8-22 mins   PT G Codes:        Nataliah Hatlestad,KATHrine E 04/06/2016, 11:51 AM Zenovia JarredKati Tierra Divelbiss, PT, DPT 04/06/2016 Pager: [phone]

## 2016-04-06 NOTE — Progress Notes (Signed)
Patient ID: [name], female   DOB: [birth year], 55 y.o.   MRN: [id number]  Subjective: 1 Day Post-Op Procedure(s) (LRB): OPEN SCAR DEBRIDEMENT WITH POLY EXCHANGE reviosion of patella (Right)    Patient reports pain as mild.  Pain better controlled but still present (history of chronic Morphine use)  Objective:   VITALS:   Vitals:   04/06/16 0350 04/06/16 0533  BP: (!) 149/70 (!) 109/57  Pulse: 93 74  Resp: 18 18  Temp: 98.4 F (36.9 C) 98.1 F (36.7 C)    Neurovascular intact Incision: dressing C/D/I  LABS  Recent Labs  04/06/16 0417  HGB 9.9*  HCT 31.6*  WBC 15.0*  PLT 311     Recent Labs  04/06/16 0417  NA 138  K 4.0  BUN 17  CREATININE 0.77  GLUCOSE 183*    No results for input(s): LABPT, INR in the last 72 hours.   Assessment/Plan: 1 Day Post-Op Procedure(s) (LRB): OPEN SCAR DEBRIDEMENT WITH POLY EXCHANGE reviosion of patella (Right)   Advance diet Up with therapy Discharge home with home health today after therapy  Reviewed intra-operative findings and thus post op expectations RTC in 2 weeks MSO4 for pain control

## 2016-04-06 NOTE — Progress Notes (Signed)
   04/06/16 1500  PT Visit Information  Last PT Received On 04/06/16  Assistance Needed +1  History of Present Illness Pt is a 55 year old female s/p Right OPEN SCAR DEBRIDEMENT WITH POLY EXCHANGE revision of patella with hx of R TKA as well as multiple surgeries for shoulder, back, and neck  Subjective Data  Subjective Pt ambulated again in hallway and mobilizing well.  Pt to d/c home today.  Precautions  Precautions Knee  Restrictions  Other Position/Activity Restrictions WBAT  Pain Assessment  Pain Assessment 0-10 (not rated, states tolerable)  Pain Location R knee  Pain Descriptors / Indicators Sore  Pain Intervention(s) Limited activity within patient's tolerance;Monitored during session;Repositioned;Premedicated before session;Ice applied  Cognition  Arousal/Alertness Awake/alert  Behavior During Therapy WFL for tasks assessed/performed  Overall Cognitive Status Within Functional Limits for tasks assessed  Bed Mobility  Overal bed mobility Modified Independent  General bed mobility comments HOB elevated  Transfers  Overall transfer level Needs assistance  Equipment used Rolling walker (2 wheeled)  Transfers Sit to/from Stand  Sit to Stand Supervision  General transfer comment performs with correct safe technique  Ambulation/Gait  Ambulation/Gait assistance Supervision  Ambulation Distance (Feet) 160 Feet  Assistive device Rolling walker (2 wheeled)  Gait Pattern/deviations Step-through pattern;Antalgic;Decreased stance time - right  General Gait Details pt utilizing RW well  Total Joint Exercises  Quad Sets AROM;10 reps;Right  Heel Slides AROM;Right;10 reps;Seated  PT - End of Session  Activity Tolerance Patient tolerated treatment well  Patient left with call bell/phone within reach;in bed  PT - Assessment/Plan  PT Plan Current plan remains appropriate  PT Frequency (ACUTE ONLY) 7X/week  Follow Up Recommendations Home health PT  PT equipment None recommended by PT   PT Time Calculation  PT Start Time (ACUTE ONLY) 1337  PT Stop Time (ACUTE ONLY) 1358  PT Time Calculation (min) (ACUTE ONLY) 21 min  PT General Charges  $$ ACUTE PT VISIT 1 Procedure  PT Treatments  $Gait Training 8-22 mins   Zenovia JarredKati Remmington Urieta, PT, DPT 04/06/2016 Pager: [phone]

## 2016-04-06 NOTE — Discharge Instructions (Signed)

## 2016-04-06 NOTE — Progress Notes (Signed)
OT Cancellation Note  Patient Details Name: Linnell Fullingatricia R Flood MRN: [id number] DOB: April 16, 1961   Cancelled Treatment:    Reason Eval/Treat Not Completed: PT screened, no needs identified, will sign off  Trease Bremner 04/06/2016, 12:24 PM  Marica OtterMaryellen Ellyce Lafevers, OTR/L [phone] 04/06/2016

## 2016-04-06 NOTE — Op Note (Signed)
[name]:  [name], [name]              ACCOUNT NO.:  [id number]  MEDICAL RECORD NO.:  [id number]  LOCATION:  PERIO                        F[facility]  PHYSICIAN:  Madlyn FrankelMatthew D. Charlann Boxerlin, M.D.  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  POSTOPERATIVE DIAGNOSES/FINDINGS: 1. Status post right total knee arthroplasty with excessive scarring     and significant parapatellar scarring, resulting a patellar clunk. 2. Patella height of 22 mm. 3. Slight hyperextension on passive exam under anesthesia.  PROCEDURES: 1. Open arthrotomy and scar debridement, particularly focusing the     medial and lateral gutters in the parapatellar region, excising an     abundant amount of hypertrophic scar in the infrapatellar region. 2. Polyethylene revision on the right knee from a size 10 to a 12.5-mm     insert to match the size 4 femur. 3. Patellar revision revising her patella from a 35 button to a 41     button, increased in height of the patella from 22-24 mm as     measured with the calipers.  SURGEON:  Madlyn FrankelMatthew D. Charlann Boxerlin, M.D.  ASSISTANT:  Lanney GinsMatthew Babish, PA-C.  Note that Mr. Tara SailsBabish was present for the entirety of the case from preoperative position, perioperative management of the operative extremity, general facilitation of the case and primary wound closure.  ANESTHESIA:  Spinal.  SPECIMENS:  None.  COMPLICATION:  None.  DRAINS:  None.  TOURNIQUET TIME:  23 minutes at 250 mmHg.  INDICATIONS FOR PROCEDURE:  Ms. [name] is a 55 year old female, referred for evaluation of persistent right knee pain following arthroplasty. She had clinical concerns of crepitation and catching of the right knee. On exam, she had obvious patellar clunk with significant crepitation and catching of the knee from flexed to  extended position.  We discussed the risks, benefits, pros and cons of observation versus surgical excision.  She wished to proceed with surgery based on the persistence of her symptoms.  We discussed the risks of recurrence.  We discussed open versus arthroscopic techniques and ability to alter the components as needed.  Standard risks of infection and DVT reviewed.  Postop course reviewed and expectations.  PROCEDURE IN DETAIL:  The patient was brought to the operative theater. Once adequate anesthesia, preoperative antibiotics, Ancef administered as well as tranexamic acid and Decadron, she was positioned supine with a right thigh tourniquet placed.  The right lower extremity was then prepped and draped in usual sterile fashion.  Time-out was performed identifying the patient, planned procedure and extremity.  Leg was exsanguinated.  Tourniquet was elevated to 250 mmHg.  The [facility] leg holder was utilized.  The patient's old incision was demarcated in a portion of it utilized.  Soft tissue planes created followed by medial arthrotomy encountering clear synovial fluid.  At this point, I recreated the medial gutter by excising scar into the suprapatellar pouch.  I then using towel clips, everted the patella, identifying significant and abundant hypertrophic scar into the peripatellar region, worse in the infrapatellar region.  Then,  using a 10-blade, I sharply excised the scar down to the quadriceps and patellar tendon circumferentially.  Once the scar was debrided, I then removed all scar as much as possible from the lateral gutters as well to the suprapatellar pouch again.  At this point, I evaluated her knee.  Two decisions were made.  #1.  I was going to increase the polyethylene thickness due to slight hyperextension on examination under anesthesia.  I removed the old polyethylene.  I was able to excise further scar around the tibial plateau area and then replaced this with  a size 12.5 x size 4 insert.  The knee was reduced.  At this point, I evaluated her patella.  At this point, I recognized the height of the patella combined with the polyethylene insert, it was 22 mm.  Due to her tendency to have the scarring formation and to try to reduce the incidence of patella scarring, I elected to revise the patella.  I used a thin oscillating saw to remove the patellar button and cement and then the polyethylene debris.  I then redrilled for 41-mm patella to try to increase the height of the patella.  The final 41 patellar button was opened and the cement bowl was mixed. The knee was irrigated with normal saline solution with a pulse lavage. The final 41 patellar button was cemented into place and held until the cement was cured.  Final height of the patella now was 24 mm.  Following irrigating the knee again, the tourniquet was let down with no significant hemostasis required.  The extensor mechanism was then reapproximated using #1 Vicryl and 0 V-Loc suture.  The remainder of the wound was closed with 2-0 Vicryl and running Monocryl.  The knee was then cleaned, dried and dressed sterilely using surgical glue, Aquacel dressing and Ace wrap.  Findings were reviewed with family.  Anticipate her being in the hospital in 1-2 days.  Activity with physical therapy should be significantly better for her.     Madlyn FrankelMatthew D. Charlann Boxerlin, M.D.     MDO/MEDQ  D:  04/05/2016  T:  04/06/2016  Job:  [id number]

## 2016-04-12 NOTE — Discharge Summary (Signed)
Physician Discharge Summary  Patient ID: [name] MRN: [id number] DOB/AGE: [birth year] 55 y.o.  Admit date: 04/05/2016 Discharge date: 04/06/2016   Procedures:  Procedure(s) (LRB): OPEN SCAR DEBRIDEMENT WITH POLY EXCHANGE reviosion of patella (Right)  Attending Physician:  Dr. Durene Romans   Admission Diagnoses:   Status post right TKA with patellar clunk  Discharge Diagnoses:  Principal Problem:   S/P revision of right TK  Past Medical History:  Diagnosis Date  . Anemia   . Anxiety   . Arthritis    multiple areas  . Bone marrow disease    DX many years ago - pt unsure of name and unable to find info on type of disease  . Complication of anesthesia 2011   slow to awaken after hysterectomy  . Depression   . Dyspnea    if does not take albuterol she states she can not breath   . Family history of adverse reaction to anesthesia    pts mother has nausea and vomiting; pts sons become aggressive and have hallicunations  . Fibromyalgia   . H/O iron deficiency anemia   . Headache    MIGRAINES  . History of blood transfusion    day pt was born  . History of bronchitis   . Hx of pneumothorax    X2 IN PAST  . Kidney disease    only has one functioning kidney - no problems  x 23 yrs  . Liver failure, acute 5 years ago   cause unknown, after hysterectomy  . Neuropathy (HCC)   . Numbness    MULTIPLE AREAS - "from all the surgeries I've had"  . PONV (postoperative nausea and vomiting)     HPI:    [name], 55 y.o. female, has a history of pain and functional disability in the right knee(s) due to pain and patellar clunk and patient has failed non-surgical conservative treatments for greater than 12 weeks to include NSAID's and/or analgesics, use of assistive devices and activity modification. The indications for surgery on  the total knee arthroplasty are patellar clunk. Onset of symptoms was gradual starting 9 months ago with gradually worsening course since  that time.  Prior procedures on the right knee(s) include arthroplasty. Patient currently rates pain in the right knee(s) at 10 out of 10 with activity. There is night pain, worsening of pain with activity and weight bearing, pain that interferes with activities of daily living, pain with passive range of motion, crepitus and joint swelling.  Patient has evidence of previous TKA by imaging studies. This condition presents safety issues increasing the risk of falls.  There is no current active infection.   Risks, benefits and expectations were discussed with the patient.  Risks including but not limited to the risk of anesthesia, blood clots, nerve damage, blood vessel damage, failure of the prosthesis, infection and up to and including death.  Patient understand the risks, benefits and expectations and wishes to proceed with surgery.  PCP: Kaleen Mask, MD   Discharged Condition: good  Hospital Course:  Patient underwent the above stated procedure on 04/05/2016. Patient tolerated the procedure well and brought to the recovery room in good condition and subsequently to the floor.  POD #1 BP: 109/57 ; Pulse: 74 ; Temp: 98.1 F (36.7 C) ; Resp: 18 Patient reports pain as mild.  Pain better controlled but still present (history of chronic Morphine use) Neurovascular intact and incision: dressing C/D/I  LABS  Basename    HGB  9.9  HCT     31.6    Discharge Exam: General appearance: alert, cooperative and no distress Extremities: Homans sign is negative, no sign of DVT, no edema, redness or tenderness in the calves or thighs and no ulcers, gangrene or trophic changes  Disposition: Home with follow up in 2 weeks   Follow-up Information    Shelda PalLIN,Xzaiver Vayda D, MD. Schedule an appointment as soon as possible for a visit in 2 week(s).   Specialty:  Orthopedic Surgery Contact information: [address] [place] [address] [phone]        [facility] Follow  up.   Specialty:  Home Health Services Why:  physical therapy Contact information: [address] [place] [place] [address] [phone]           Discharge Instructions    Call MD / Call 911    Complete by:  As directed    If you experience chest pain or shortness of breath, CALL 911 and be transported to the hospital emergency room.  If you develope a fever above 101 F, pus (white drainage) or increased drainage or redness at the wound, or calf pain, call your surgeon's office.   Change dressing    Complete by:  As directed    Maintain surgical dressing until follow up in the clinic. If the edges start to pull up, may reinforce with tape. If the dressing is no longer working, may remove and cover with gauze and tape, but must keep the area dry and clean.  Call with any questions or concerns.   Constipation Prevention    Complete by:  As directed    Drink plenty of fluids.  Prune juice may be helpful.  You may use a stool softener, such as Colace (over the counter) 100 mg twice a day.  Use MiraLax (over the counter) for constipation as needed.   Diet - low sodium heart healthy    Complete by:  As directed    Discharge instructions    Complete by:  As directed    Maintain surgical dressing until follow up in the clinic. If the edges start to pull up, may reinforce with tape. If the dressing is no longer working, may remove and cover with gauze and tape, but must keep the area dry and clean.  [facility] Call with any questions or concerns.   Increase activity slowly as tolerated    Complete by:  As directed    Weight bearing as tolerated with assist device (walker, cane, etc) as directed, use it as long as suggested by your surgeon or therapist, typically at least 4-6 weeks.   TED hose    Complete by:  As directed    Use stockings (TED hose) for 2 weeks on both leg(s).  You may remove them at night for sleeping.        Medication List     STOP taking these medications   ibuprofen 200 MG tablet Commonly known as:  ADVIL,MOTRIN   meloxicam 15 MG tablet Commonly known as:  MOBIC   meloxicam 7.5 MG tablet Commonly known as:  MOBIC     TAKE these medications   albuterol 8 MG 12 hr tablet Commonly known as:  VOSPIRE ER Take 2 tablets by mouth 2 (two) times daily.   aspirin 325 MG EC tablet Take 1 tablet (325 mg total) by mouth 2 (two) times daily.   buPROPion 150 MG  24 hr tablet Commonly known as:  WELLBUTRIN XL Take 300 mg by mouth every morning.   celecoxib 200 MG capsule Commonly known as:  CELEBREX Take 1 capsule (200 mg total) by mouth every 12 (twelve) hours. Take along with Pepcid to help avoid GI upset.   diphenhydrAMINE 25 MG tablet Commonly known as:  BENADRYL Take 25 mg by mouth every 6 (six) hours as needed for itching.   docusate sodium 100 MG capsule Commonly known as:  COLACE Take 1 capsule (100 mg total) by mouth 2 (two) times daily.   EPIPEN 2-PAK 0.3 mg/0.3 mL Soaj injection Generic drug:  EPINEPHrine Inject 0.3 mg as directed as needed (allergic reaction).   ferrous sulfate 325 (65 FE) MG tablet Take 1 tablet (325 mg total) by mouth 3 (three) times daily after meals.   HAIR SKIN AND NAILS FORMULA Tabs Take 2 tablets by mouth daily.   hydrOXYzine 50 MG tablet Commonly known as:  ATARAX/VISTARIL Take 50 mg by mouth 2 (two) times daily as needed for anxiety.   methocarbamol 500 MG tablet Commonly known as:  ROBAXIN Take 1 tablet (500 mg total) by mouth every 6 (six) hours as needed for muscle spasms.   morphine 15 MG tablet Commonly known as:  MSIR Take 1-2 tablets (15-30 mg total) by mouth every 4 (four) hours as needed for severe pain. What changed:  how much to take   ondansetron 8 MG tablet Commonly known as:  ZOFRAN Take 8 mg by mouth 3 (three) times daily as needed for nausea or vomiting.   polyethylene glycol packet Commonly known as:  MIRALAX / GLYCOLAX Take 17 g by  mouth 2 (two) times daily.   pregabalin 225 MG capsule Commonly known as:  LYRICA Take 225 mg by mouth 2 (two) times daily.   rOPINIRole 2 MG tablet Commonly known as:  REQUIP Take 1 mg by mouth at bedtime.   topiramate 25 MG tablet Commonly known as:  TOPAMAX Take 25-50 mg by mouth 2 (two) times daily. Take 50mg  in the morning and 25mg s at night        Signed: Anastasio AuerbachMatthew S. Nailah Luepke   PA-C  04/12/2016, 9:34 AM

## 2016-11-04 ENCOUNTER — Other Ambulatory Visit: Payer: Self-pay | Admitting: Family Medicine

## 2016-11-04 DIAGNOSIS — M542 Cervicalgia: Secondary | ICD-10-CM

## 2016-11-27 ENCOUNTER — Ambulatory Visit
Admission: RE | Admit: 2016-11-27 | Discharge: 2016-11-27 | Disposition: A | Discharge status: Home / Self Care | Payer: Medicare Other | Source: Ambulatory Visit | Attending: Family Medicine | Admitting: Family Medicine

## 2016-11-27 DIAGNOSIS — M542 Cervicalgia: Secondary | ICD-10-CM

## 2017-02-21 ENCOUNTER — Other Ambulatory Visit: Payer: Self-pay | Admitting: Nurse Practitioner

## 2017-02-21 ENCOUNTER — Ambulatory Visit
Admission: RE | Admit: 2017-02-21 | Discharge: 2017-02-21 | Disposition: A | Discharge status: Home / Self Care | Payer: Medicare Other | Source: Ambulatory Visit | Attending: Nurse Practitioner | Admitting: Nurse Practitioner

## 2017-02-21 DIAGNOSIS — R1011 Right upper quadrant pain: Secondary | ICD-10-CM

## 2017-02-21 DIAGNOSIS — R109 Unspecified abdominal pain: Secondary | ICD-10-CM

## 2017-02-21 DIAGNOSIS — R10A1 Flank pain, right side: Secondary | ICD-10-CM

## 2017-02-23 ENCOUNTER — Other Ambulatory Visit: Payer: Self-pay | Admitting: Nurse Practitioner

## 2017-02-23 DIAGNOSIS — Z1231 Encounter for screening mammogram for malignant neoplasm of breast: Secondary | ICD-10-CM

## 2017-06-02 ENCOUNTER — Ambulatory Visit
Admission: RE | Admit: 2017-06-02 | Discharge: 2017-06-02 | Disposition: A | Discharge status: Home / Self Care | Payer: Medicare Other | Source: Ambulatory Visit | Attending: Nurse Practitioner | Admitting: Nurse Practitioner

## 2017-06-02 ENCOUNTER — Other Ambulatory Visit: Payer: Self-pay | Admitting: Nurse Practitioner

## 2017-06-02 DIAGNOSIS — J449 Chronic obstructive pulmonary disease, unspecified: Secondary | ICD-10-CM

## 2017-06-02 DIAGNOSIS — R0602 Shortness of breath: Secondary | ICD-10-CM

## 2017-06-02 DIAGNOSIS — R509 Fever, unspecified: Secondary | ICD-10-CM

## 2017-07-05 ENCOUNTER — Institutional Professional Consult (permissible substitution): Payer: Medicare Other | Admitting: Internal Medicine

## 2018-04-04 ENCOUNTER — Encounter: Payer: Self-pay | Admitting: Hematology & Oncology

## 2018-04-04 ENCOUNTER — Telehealth: Payer: Self-pay | Admitting: Hematology & Oncology

## 2018-04-04 NOTE — Telephone Encounter (Signed)
Spoke with patient to confirm new patient appt 04/27/18 at 1030 am. Pt aware of location/ date/time

## 2018-04-27 ENCOUNTER — Inpatient Hospital Stay: Payer: Medicare Other | Attending: Hematology & Oncology | Admitting: Hematology & Oncology

## 2018-04-27 ENCOUNTER — Encounter: Payer: Self-pay | Admitting: Hematology & Oncology

## 2018-04-27 ENCOUNTER — Other Ambulatory Visit: Payer: Self-pay

## 2018-04-27 ENCOUNTER — Inpatient Hospital Stay: Payer: Medicare Other

## 2018-04-27 VITALS — BP 146/81 | HR 64 | Temp 98.0°F | Resp 18 | Wt 211.2 lb

## 2018-04-27 DIAGNOSIS — N939 Abnormal uterine and vaginal bleeding, unspecified: Secondary | ICD-10-CM

## 2018-04-27 DIAGNOSIS — M797 Fibromyalgia: Secondary | ICD-10-CM | POA: Diagnosis not present

## 2018-04-27 DIAGNOSIS — G2581 Restless legs syndrome: Secondary | ICD-10-CM | POA: Insufficient documentation

## 2018-04-27 DIAGNOSIS — D649 Anemia, unspecified: Secondary | ICD-10-CM | POA: Insufficient documentation

## 2018-04-27 LAB — CMP (CANCER CENTER ONLY)
ALK PHOS: 93 U/L (ref 38–126)
ALT: 9 U/L (ref 0–44)
AST: 13 U/L — AB (ref 15–41)
Albumin: 4.3 g/dL (ref 3.5–5.0)
Anion gap: 6 (ref 5–15)
BILIRUBIN TOTAL: 0.5 mg/dL (ref 0.3–1.2)
BUN: 14 mg/dL (ref 6–20)
CO2: 29 mmol/L (ref 22–32)
Calcium: 8.8 mg/dL — ABNORMAL LOW (ref 8.9–10.3)
Chloride: 107 mmol/L (ref 98–111)
Creatinine: 0.85 mg/dL (ref 0.44–1.00)
GFR, Estimated: >60 mL/min (ref >60)
GLUCOSE: 85 mg/dL (ref 70–99)
POTASSIUM: 3.6 mmol/L (ref 3.5–5.1)
Sodium: 142 mmol/L (ref 135–145)
Total Protein: 6.9 g/dL (ref 6.5–8.1)

## 2018-04-27 LAB — CBC WITH DIFFERENTIAL (CANCER CENTER ONLY)
Abs Immature Granulocytes: 0.02 10*3/uL (ref 0.00–0.07)
BASOS ABS: 0.1 10*3/uL (ref 0.0–0.1)
BASOS PCT: 1 %
EOS PCT: 2 %
Eosinophils Absolute: 0.1 10*3/uL (ref 0.0–0.5)
HCT: 40.8 % (ref 36.0–46.0)
Hemoglobin: 12.2 g/dL (ref 12.0–15.0)
Immature Granulocytes: 0 %
Lymphocytes Relative: 29 %
Lymphs Abs: 1.7 10*3/uL (ref 0.7–4.0)
MCH: 26.6 pg (ref 26.0–34.0)
MCHC: 29.9 g/dL — ABNORMAL LOW (ref 30.0–36.0)
MCV: 89.1 fL (ref 80.0–100.0)
MONO ABS: 0.8 10*3/uL (ref 0.1–1.0)
Monocytes Relative: 13 %
NEUTROS ABS: 3.2 10*3/uL (ref 1.7–7.7)
NRBC: 0 % (ref 0.0–0.2)
Neutrophils Relative %: 55 %
PLATELETS: 313 10*3/uL (ref 150–400)
RBC: 4.58 MIL/uL (ref 3.87–5.11)
RDW: 13.3 % (ref 11.5–15.5)
WBC: 5.8 10*3/uL (ref 4.0–10.5)

## 2018-04-27 LAB — RETICULOCYTES
IMMATURE RETIC FRACT: 4 % (ref 2.3–15.9)
RBC.: 4.58 MIL/uL (ref 3.87–5.11)
RETIC CT PCT: 0.8 % (ref 0.4–3.1)
Retic Count, Absolute: 36.6 10*3/uL (ref 19.0–186.0)

## 2018-04-27 LAB — VITAMIN B12: Vitamin B-12: 232 pg/mL (ref 180–914)

## 2018-04-27 LAB — SAVE SMEAR (SSMR)

## 2018-04-27 NOTE — Progress Notes (Signed)
Referral MD  Reason for Referral: Transient anemia; restless leg syndrome; fibromyalgia  Chief Complaint  Patient presents with  . New Patient (Initial Visit)  : My doctor thought it would be good for me to come here again.  HPI: [name] is a very nice 57 year old white female.  We apparently have not seen her for about 6 or 7 years.  Unfortunately, I cannot get all records on her.  She was referred because of anemia.  I am not sure exactly what the issue is.  Going back to October 2015, her CBC showed a white cell count of 6.6 hemoglobin 12.1 platelet count 310,000.  Her MCV was 85.  In March 2016, her white cell count was 12.1 hemoglobin 9 platelet count 246,000.  MCV was 87.5.  In November 2017, her white cell count was 15 hemoglobin 9.9 platelet count 311,000.  Her MCV was 86.6.  She is followed at [facility].  She has had lab work done earlier this year.  In April 2019, her white cell count 6.1 hemoglobin 11.9 and platelet count 296,000.  Her MCV was 84.  She had a normal electrolytes.  She says that we have given her IV iron in the past.  She thought that she would need this again.  She has had no bleeding.  She has had no problems with weight loss.  She is had no weight gain.  She says her last mammogram was just a month or so ago.  She has not noticed any rashes.  She again is bothered by the restless leg syndrome.  She is on Requip for this.  There is been no cough.  She is not a smoker.  The last iron studies that I see on her were from 2012.  Her ferritin was 144 with an iron saturation of 17%.  Overall, her performance status is ECOG 1-2.   Past Medical History:  Diagnosis Date  . Anemia   . Anxiety   . Arthritis    multiple areas  . Bone marrow disease    DX many years ago - pt unsure of name and unable to find info on type of disease  . Complication of anesthesia 2011   slow to awaken after hysterectomy  . Depression   . Dyspnea     if does not take albuterol she states she can not breath   . Family history of adverse reaction to anesthesia    pts mother has nausea and vomiting; pts sons become aggressive and have hallicunations  . Fibromyalgia   . H/O iron deficiency anemia   . Headache    MIGRAINES  . History of blood transfusion    day pt was born  . History of bronchitis   . Hx of pneumothorax    X2 IN PAST  . Kidney disease    only has one functioning kidney - no problems  x 23 yrs  . Liver failure, acute 5 years ago   cause unknown, after hysterectomy  . Neuropathy   . Numbness    MULTIPLE AREAS - "from all the surgeries I've had"  . PONV (postoperative nausea and vomiting)   :  Past Surgical History:  Procedure Laterality Date  . ABDOMINAL HYSTERECTOMY    . BACK SURGERY     multiple  . CERVICAL  SPINE SURG     SEVERAL TIMES  . CERVICAL DISCECTOMY     with fusion  . DILATION AND CURETTAGE OF UTERUS    .  FOOT SURGERY  1989  . HERNIA REPAIR  2011  . KNEE ARTHROSCOPY WITH DRILLING/MICROFRACTURE Right 03/13/2014   Procedure: RIGHT MICROFRACTURE TECHNIQUE;  Surgeon: Jacki Conesonald A Gioffre, MD;  Location: [facility];  Service: Orthopedics;  Laterality: Right;  . KNEE ARTHROSCOPY WITH MEDIAL MENISECTOMY Right 03/13/2014   Procedure: RIGHT KNEE ARTHROSCOPY WITH MEDIAL FEMORAL CONDYLE REPAIR;  Surgeon: Jacki Conesonald A Gioffre, MD;  Location: [facility];  Service: Orthopedics;  Laterality: Right;  . KNEE ARTHROSCOPY WITH MEDIAL PATELLAR FEMORAL LIGAMENT RECONSTRUCTION Right 03/13/2014   Procedure: ABRASION CHONDROPLASTY OF MEDIAL FEMORAL;  Surgeon: Jacki Conesonald A Gioffre, MD;  Location: [facility];  Service: Orthopedics;  Laterality: Right;  . left knee arthroscopy    . polyp removal  2010   uterine  . right leg surgery      secondary to trauma  . SHOULDER SURGERY     Bilaterally X9 SURGERIES  . SYNOVECTOMY Right 03/13/2014   Procedure: SYNOVECTOMY OF THE SUPRAPATELLA POUCH;  Surgeon: Jacki Conesonald A Gioffre, MD;  Location: [facility];   Service: Orthopedics;  Laterality: Right;  . TONSILLECTOMY    . TOTAL KNEE ARTHROPLASTY Right 07/30/2014   Procedure: RIGHT TOTAL KNEE ARTHROPLASTY;  Surgeon: Ranee Gosselinonald Gioffre, MD;  Location: [facility];  Service: Orthopedics;  Laterality: Right;  . TOTAL KNEE REVISION WITH SCAR DEBRIDEMENT/PATELLA REVISION WITH POLY EXCHANGE Right 04/05/2016   Procedure: OPEN SCAR DEBRIDEMENT WITH POLY EXCHANGE reviosion of patella;  Surgeon: Durene RomansMatthew Olin, MD;  Location: [facility];  Service: Orthopedics;  Laterality: Right;  :   Current Outpatient Medications:  .  albuterol (VOSPIRE ER) 8 MG 12 hr tablet, Take 2 tablets by mouth 2 (two) times daily. , Disp: , Rfl:  .  aspirin EC 325 MG EC tablet, Take 1 tablet (325 mg total) by mouth 2 (two) times daily., Disp: 60 tablet, Rfl: 0 .  buPROPion (WELLBUTRIN XL) 150 MG 24 hr tablet, Take 300 mg by mouth every morning. , Disp: , Rfl:  .  celecoxib (CELEBREX) 200 MG capsule, Take 1 capsule (200 mg total) by mouth every 12 (twelve) hours. Take along with Pepcid to help avoid GI upset., Disp: 60 capsule, Rfl: 0 .  diphenhydrAMINE (BENADRYL) 25 MG tablet, Take 25 mg by mouth every 6 (six) hours as needed for itching., Disp: , Rfl:  .  docusate sodium (COLACE) 100 MG capsule, Take 1 capsule (100 mg total) by mouth 2 (two) times daily., Disp: 10 capsule, Rfl: 0 .  EPIPEN 2-PAK 0.3 MG/0.3ML SOAJ injection, Inject 0.3 mg as directed as needed (allergic reaction). , Disp: , Rfl:  .  ferrous sulfate 325 (65 FE) MG tablet, Take 1 tablet (325 mg total) by mouth 3 (three) times daily after meals., Disp: , Rfl: 3 .  hydrOXYzine (ATARAX/VISTARIL) 50 MG tablet, Take 50 mg by mouth 2 (two) times daily as needed for anxiety. , Disp: , Rfl:  .  methocarbamol (ROBAXIN) 500 MG tablet, Take 1 tablet (500 mg total) by mouth every 6 (six) hours as needed for muscle spasms., Disp: 40 tablet, Rfl: 0 .  morphine (MSIR) 15 MG tablet, Take 1-2 tablets (15-30 mg total) by mouth every 4 (four) hours as  needed for severe pain., Disp: 40 tablet, Rfl: 0 .  Multiple Vitamins-Minerals (HAIR SKIN AND NAILS FORMULA) TABS, Take 2 tablets by mouth daily., Disp: , Rfl:  .  ondansetron (ZOFRAN) 8 MG tablet, Take 8 mg by mouth 3 (three) times daily as needed for nausea or vomiting. , Disp: , Rfl:  .  polyethylene glycol (MIRALAX / GLYCOLAX) packet, Take 17 g by mouth 2 (two) times daily., Disp: 14 each, Rfl: 0 .  pregabalin (LYRICA) 225 MG capsule, Take 225 mg by mouth 2 (two) times daily., Disp: , Rfl:  .  rOPINIRole (REQUIP) 2 MG tablet, Take 1 mg by mouth at bedtime. , Disp: , Rfl:  .  topiramate (TOPAMAX) 25 MG tablet, Take 25-50 mg by mouth 2 (two) times daily. Take 50mg  in the morning and 25mg s at night, Disp: , Rfl: :  :  Allergies  Allergen Reactions  . Fish Allergy Anaphylaxis    To all fish, not just shellfish.  Cannot tolerate even the smell of seafood without her airway swelling/closing.  . Other Shortness Of Breath    Whipped cream and pudding  . Nortriptyline Other (See Comments)    hallucinations  . Cyclobenzaprine   . Tylenol [Acetaminophen] Other (See Comments)    Cannot take due to past liver failure  . Bee Venom Hives  . Flexeril [Cyclobenzaprine Hcl] Other (See Comments)    Intense anger  :  Family History  Problem Relation Age of Onset  . Heart disease Mother   . Heart disease Father   . Colon cancer Father   . Heart disease Maternal Grandfather   . Heart disease Maternal Grandmother   . Heart disease Paternal Grandfather   . Heart disease Paternal Grandmother   :  Social History   Socioeconomic History  . Marital status: Divorced    Spouse name: Not on file  . Number of children: 2  . Years of education: Not on file  . Highest education level: Not on file  Occupational History  . Occupation: disabled    Comment: back problems  Social Needs  . Financial resource strain: Not on file  . Food insecurity:    Worry: Not on file    Inability: Not on file  .  Transportation needs:    Medical: Not on file    Non-medical: Not on file  Tobacco Use  . Smoking status: Never Smoker  . Smokeless tobacco: Never Used  Substance and Sexual Activity  . Alcohol use: No  . Drug use: No  . Sexual activity: Not on file  Lifestyle  . Physical activity:    Days per week: Not on file    Minutes per session: Not on file  . Stress: Not on file  Relationships  . Social connections:    Talks on phone: Not on file    Gets together: Not on file    Attends religious service: Not on file    Active member of club or organization: Not on file    Attends meetings of clubs or organizations: Not on file    Relationship status: Not on file  . Intimate partner violence:    Fear of current or ex partner: Not on file    Emotionally abused: Not on file    Physically abused: Not on file    Forced sexual activity: Not on file  Other Topics Concern  . Not on file  Social History Narrative   Divorced and lives with her youngest son. Disabled with back problems.  :  Review of Systems  Constitutional: Positive for malaise/fatigue.  HENT: Negative.   Eyes: Negative.   Respiratory: Negative.   Cardiovascular: Negative.   Gastrointestinal: Negative.   Genitourinary: Negative.   Musculoskeletal: Positive for joint pain and myalgias.  Skin: Negative.   Neurological: Positive for tingling and focal weakness.  Endo/Heme/Allergies: Negative.   Psychiatric/Behavioral: The patient is nervous/anxious.      Exam: Well-developed and well-nourished white female in no obvious distress.  Vital signs show a temperature of 98.  Pulse 64.  Blood pressure 146/81.  Weight is 211 pounds.  Head neck exam shows no ocular or oral lesions.  There are no palpable cervical or supraclavicular lymph nodes.  Lungs are clear bilaterally.  Cardiac exam regular rate and rhythm with no murmurs, rubs or bruits.  Abdomen is soft.  She has good bowel sounds.  There is no fluid wave.  There is no  palpable liver or spleen tip.  Back exam shows no tenderness over the spine, ribs or hips.  Extremities shows no clubbing, cyanosis or edema.  Neurological exam shows no focal neurological deficits.  She does have some tenderness over her long bones.  Skin exam shows no rashes, ecchymoses or petechia. @IPVITALS @   Recent Labs    04/27/18 1104  WBC 5.8  HGB 12.2  HCT 40.8  PLT 313   Recent Labs    04/27/18 1104  NA 142  K 3.6  CL 107  CO2 29  GLUCOSE 85  BUN 14  CREATININE 0.85  CALCIUM 8.8*    Blood smear review: Normochromic and normocytic population of red blood cells.  There may be some slight microcytic red blood cells.  I see no nucleated red blood cells.  There are no teardrop cells.  I see no rouleaux formation.  There are no schistocytes or spherocytes.  White blood cells appear normal in morphology maturation.  There are no immature myeloid or lymphoid forms.  Platelets are adequate and number and size.  Platelets are well granulated.  Pathology: None    Assessment and Plan: [name] is a very nice 57 year old white female.  She has no problems with anemia today.  Her blood smear really is not that revealing.  I am not sure how we are to help her.  We will see what her iron studies show.  She is taking some oral iron supplement.  I just feel bad that she is having a lot of problems.  She has this restless leg syndrome that really is causing issues for her.  I think her fibro-myalgia probably is also causing problems.  Again, we will see what her iron studies show.  I would be surprised if she does have issues with iron deficiency.  We will plan to get her back depending on what we find with her iron studies.  I spent about 45 minutes with her today.  All the time spent face-to-face.  We helped coordinate further appointments.  I answered all of her questions.

## 2018-04-28 LAB — IRON AND TIBC
Iron: 65 ug/dL (ref 41–142)
Saturation Ratios: 19 % — ABNORMAL LOW (ref 21–57)
TIBC: 339 ug/dL (ref 236–444)
UIBC: 274 ug/dL (ref 120–384)

## 2018-04-28 LAB — FERRITIN: Ferritin: 44 ng/mL (ref 11–307)

## 2018-04-28 LAB — ERYTHROPOIETIN: ERYTHROPOIETIN: 8.5 m[IU]/mL (ref 2.6–18.5)

## 2018-05-01 LAB — HEMOGLOBINOPATHY EVALUATION
HGB A: 97.8 % (ref 96.4–98.8)
HGB F QUANT: 0 % (ref 0.0–2.0)
HGB VARIANT: 0 %
Hgb A2 Quant: 2.2 % (ref 1.8–3.2)
Hgb C: 0 %
Hgb S Quant: 0 %

## 2018-05-08 ENCOUNTER — Telehealth: Payer: Self-pay | Admitting: Hematology & Oncology

## 2018-05-08 NOTE — Telephone Encounter (Signed)
LMVM for patient regarding appointment added to her schedule for Iron infusion for 12/26/ per 12/13 staff message

## 2018-05-11 ENCOUNTER — Ambulatory Visit: Payer: Medicare Other

## 2018-05-15 ENCOUNTER — Inpatient Hospital Stay: Payer: Medicare Other

## 2018-05-15 ENCOUNTER — Other Ambulatory Visit: Payer: Self-pay

## 2018-05-15 VITALS — BP 121/65 | HR 60 | Temp 98.4°F | Resp 17

## 2018-05-15 DIAGNOSIS — D649 Anemia, unspecified: Secondary | ICD-10-CM | POA: Diagnosis not present

## 2018-05-15 DIAGNOSIS — N939 Abnormal uterine and vaginal bleeding, unspecified: Secondary | ICD-10-CM

## 2018-05-15 MED ORDER — IBUPROFEN 200 MG PO TABS
400.0000 mg | ORAL_TABLET | Freq: Once | ORAL | Status: AC
  Administered 2018-05-15: 400 mg via ORAL

## 2018-05-15 MED ORDER — SODIUM CHLORIDE 0.9 % IV SOLN
510.0000 mg | Freq: Once | INTRAVENOUS | Status: AC
  Administered 2018-05-15: 510 mg via INTRAVENOUS
  Filled 2018-05-15: qty 17

## 2018-05-15 MED ORDER — IBUPROFEN 200 MG PO TABS
ORAL_TABLET | ORAL | Status: AC
Start: 2018-05-15 — End: 2018-05-15
  Filled 2018-05-15: qty 2

## 2018-05-15 MED ORDER — SODIUM CHLORIDE 0.9 % IV SOLN
INTRAVENOUS | Status: DC
  Administered 2018-05-15: 11:00:00 via INTRAVENOUS
  Filled 2018-05-15: qty 250

## 2018-05-15 NOTE — Progress Notes (Signed)
Pt requesting Ibuprofen for complaints of headache.  Dr. Myna HidalgoEnnever notified and order received for pt to receive Ibuprofen 400 mg PO now.

## 2018-05-15 NOTE — Patient Instructions (Signed)

## 2018-06-02 ENCOUNTER — Encounter: Payer: Self-pay | Admitting: Hematology & Oncology

## 2018-06-06 ENCOUNTER — Other Ambulatory Visit: Payer: Self-pay | Admitting: *Deleted

## 2018-06-06 DIAGNOSIS — D649 Anemia, unspecified: Secondary | ICD-10-CM

## 2018-06-07 ENCOUNTER — Inpatient Hospital Stay: Payer: Medicare Other | Attending: Hematology & Oncology | Admitting: Hematology & Oncology

## 2018-06-07 ENCOUNTER — Other Ambulatory Visit: Payer: Self-pay

## 2018-06-07 ENCOUNTER — Encounter: Payer: Self-pay | Admitting: Hematology & Oncology

## 2018-06-07 ENCOUNTER — Inpatient Hospital Stay: Payer: Medicare Other

## 2018-06-07 DIAGNOSIS — D508 Other iron deficiency anemias: Secondary | ICD-10-CM | POA: Insufficient documentation

## 2018-06-07 DIAGNOSIS — M797 Fibromyalgia: Secondary | ICD-10-CM | POA: Insufficient documentation

## 2018-06-07 DIAGNOSIS — D649 Anemia, unspecified: Secondary | ICD-10-CM

## 2018-06-07 DIAGNOSIS — G2581 Restless legs syndrome: Secondary | ICD-10-CM | POA: Diagnosis not present

## 2018-06-07 DIAGNOSIS — K909 Intestinal malabsorption, unspecified: Secondary | ICD-10-CM | POA: Diagnosis not present

## 2018-06-07 DIAGNOSIS — R1011 Right upper quadrant pain: Secondary | ICD-10-CM

## 2018-06-07 LAB — CBC WITH DIFFERENTIAL (CANCER CENTER ONLY)
Abs Immature Granulocytes: 0.02 10*3/uL (ref 0.00–0.07)
Basophils Absolute: 0.1 10*3/uL (ref 0.0–0.1)
Basophils Relative: 1 %
EOS PCT: 2 %
Eosinophils Absolute: 0.1 10*3/uL (ref 0.0–0.5)
HCT: 39.5 % (ref 36.0–46.0)
HEMOGLOBIN: 12.2 g/dL (ref 12.0–15.0)
Immature Granulocytes: 0 %
Lymphocytes Relative: 29 %
Lymphs Abs: 1.9 10*3/uL (ref 0.7–4.0)
MCH: 27.6 pg (ref 26.0–34.0)
MCHC: 30.9 g/dL (ref 30.0–36.0)
MCV: 89.4 fL (ref 80.0–100.0)
Monocytes Absolute: 1 10*3/uL (ref 0.1–1.0)
Monocytes Relative: 16 %
Neutro Abs: 3.3 10*3/uL (ref 1.7–7.7)
Neutrophils Relative %: 52 %
Platelet Count: 286 10*3/uL (ref 150–400)
RBC: 4.42 MIL/uL (ref 3.87–5.11)
RDW: 13.6 % (ref 11.5–15.5)
WBC Count: 6.3 10*3/uL (ref 4.0–10.5)
nRBC: 0 % (ref 0.0–0.2)

## 2018-06-07 LAB — CMP (CANCER CENTER ONLY)
ALT: 9 U/L (ref 0–44)
AST: 13 U/L — ABNORMAL LOW (ref 15–41)
Albumin: 4.3 g/dL (ref 3.5–5.0)
Alkaline Phosphatase: 99 U/L (ref 38–126)
Anion gap: 6 (ref 5–15)
BUN: 14 mg/dL (ref 6–20)
CO2: 29 mmol/L (ref 22–32)
CREATININE: 0.83 mg/dL (ref 0.44–1.00)
Calcium: 9.6 mg/dL (ref 8.9–10.3)
Chloride: 107 mmol/L (ref 98–111)
GFR, Est AFR Am: >60 mL/min (ref >60)
Glucose, Bld: 114 mg/dL — ABNORMAL HIGH (ref 70–99)
Potassium: 4.4 mmol/L (ref 3.5–5.1)
Sodium: 142 mmol/L (ref 135–145)
Total Bilirubin: 0.4 mg/dL (ref 0.3–1.2)
Total Protein: 6.6 g/dL (ref 6.5–8.1)

## 2018-06-07 NOTE — Progress Notes (Signed)
Hematology and Oncology Follow Up Visit  [name] [id number] [birth year] 58 y.o. 06/07/2018   Principle Diagnosis:   Iron deficiency anemia secondary to malabsorption  Current Therapy:    IV iron as indicated-last dose given on 05/15/2018     Interim History:  [name] is back for follow-up.  This is her second office visit.  We saw her back in December.  At that time, she did have moderate iron deficiency.  Her iron studies showed a ferritin of 44 with an iron saturation of 19%.  She got a dose of Feraheme on 05/15/2018.  She really does not feel all that much better.  She is on quite a few medications.  She does have quite a few health problems.  She just feels tired.  She does not have a lot of energy or stamina.  Her appetite is down a little bit.  She is having some abdominal pain issues.  There is some pain in the upper right quadrant of her abdomen.  Her gallbladder is still in.  I will go ahead and see about getting a gallbladder emptying scan and seeing if that is a problem for her.  If so, then she probably will need to be referred to surgery.  She has had no obvious bleeding.  There is been no change in bowel or bladder habits.  She is had no cough.  She had occasional headaches.  She has fibromyalgia.  She has restless leg syndrome.  These are bothering her quite a bit right now.  Currently, her performance status is ECOG 2.  Medications:  Current Outpatient Medications:  .  morphine (MSIR) 15 MG tablet, Take 15 mg by mouth as needed for severe pain., Disp: , Rfl:  .  albuterol (VOSPIRE ER) 8 MG 12 hr tablet, Take 2 tablets by mouth 2 (two) times daily. , Disp: , Rfl:  .  amitriptyline (ELAVIL) 10 MG tablet, Take 10 mg by mouth daily. Take three tablets, total of 30 mg at bedtime., Disp: , Rfl:  .  buPROPion (WELLBUTRIN XL) 150 MG 24 hr tablet, Take 300 mg by mouth every morning. , Disp: , Rfl:  .  celecoxib (CELEBREX) 200 MG capsule, Take 1 capsule (200  mg total) by mouth every 12 (twelve) hours. Take along with Pepcid to help avoid GI upset., Disp: 60 capsule, Rfl: 0 .  EPIPEN 2-PAK 0.3 MG/0.3ML SOAJ injection, Inject 0.3 mg as directed as needed (allergic reaction). , Disp: , Rfl:  .  hydrOXYzine (ATARAX/VISTARIL) 50 MG tablet, Take 50 mg by mouth 2 (two) times daily as needed for anxiety. , Disp: , Rfl:  .  Multiple Vitamins-Minerals (HAIR SKIN AND NAILS FORMULA) TABS, Take 2 tablets by mouth daily., Disp: , Rfl:  .  ondansetron (ZOFRAN) 8 MG tablet, Take 8 mg by mouth 3 (three) times daily as needed for nausea or vomiting. , Disp: , Rfl:  .  rOPINIRole (REQUIP) 2 MG tablet, Take 1 mg by mouth at bedtime. , Disp: , Rfl:  .  topiramate (TOPAMAX) 25 MG tablet, Take 25-50 mg by mouth 2 (two) times daily. Take 50mg  in the morning and 25mg s at night, Disp: , Rfl:  .  trimethoprim-polymyxin b (POLYTRIM) ophthalmic solution, polymyxin B sulfate 10,000 unit-trimethoprim 1 mg/mL eye drops, Disp: , Rfl:   Allergies:  Allergies  Allergen Reactions  . Fish Allergy Anaphylaxis    To all fish, not just shellfish.  Cannot tolerate even the smell of seafood without her airway  swelling/closing.  . Other Shortness Of Breath    Whipped cream and pudding  . Nortriptyline Other (See Comments)    hallucinations  . Cyclobenzaprine   . Tylenol [Acetaminophen] Other (See Comments)    Cannot take due to past liver failure  . Bee Venom Hives  . Flexeril [Cyclobenzaprine Hcl] Other (See Comments)    Intense anger    Past Medical History, Surgical history, Social history, and Family History were reviewed and updated.  Review of Systems: Review of Systems  Constitutional: Positive for fatigue.  HENT:  Negative.   Eyes: Negative.   Respiratory: Negative.   Cardiovascular: Positive for palpitations.  Gastrointestinal: Positive for nausea and vomiting.  Genitourinary: Positive for dysuria.   Musculoskeletal: Positive for arthralgias, back pain, flank pain,  myalgias and neck pain.  Skin: Negative.   Neurological: Positive for light-headedness.  Hematological: Negative.   Psychiatric/Behavioral: Positive for sleep disturbance.    Physical Exam:  weight is 210 lb (95.3 kg). Her oral temperature is 99.1 F (37.3 C). Her blood pressure is 128/67 and her pulse is 71. Her respiration is 18 and oxygen saturation is 99%.   Wt Readings from Last 3 Encounters:  06/07/18 210 lb (95.3 kg)  04/27/18 211 lb 4 oz (95.8 kg)  04/05/16 209 lb (94.8 kg)    Physical Exam Vitals signs reviewed.  HENT:     Head: Normocephalic and atraumatic.  Eyes:     Pupils: Pupils are equal, round, and reactive to light.  Neck:     Musculoskeletal: Normal range of motion.  Cardiovascular:     Rate and Rhythm: Normal rate and regular rhythm.     Heart sounds: Normal heart sounds.  Pulmonary:     Effort: Pulmonary effort is normal.     Breath sounds: Normal breath sounds.  Abdominal:     General: Bowel sounds are normal.     Palpations: Abdomen is soft.  Musculoskeletal: Normal range of motion.        General: No tenderness or deformity.  Lymphadenopathy:     Cervical: No cervical adenopathy.  Skin:    General: Skin is warm and dry.     Findings: No erythema or rash.  Neurological:     Mental Status: She is alert and oriented to person, place, and time.  Psychiatric:        Behavior: Behavior normal.        Thought Content: Thought content normal.        Judgment: Judgment normal.      Lab Results  Component Value Date   WBC 6.3 06/07/2018   HGB 12.2 06/07/2018   HCT 39.5 06/07/2018   MCV 89.4 06/07/2018   PLT 286 06/07/2018     Chemistry      Component Value Date/Time   NA 142 06/07/2018 0958   K 4.4 06/07/2018 0958   CL 107 06/07/2018 0958   CO2 29 06/07/2018 0958   BUN 14 06/07/2018 0958   CREATININE 0.83 06/07/2018 0958      Component Value Date/Time   CALCIUM 9.6 06/07/2018 0958   ALKPHOS 99 06/07/2018 0958   AST 13 (L) 06/07/2018  0958   ALT 9 06/07/2018 0958   BILITOT 0.4 06/07/2018 0958       Impression and Plan: [name] is a 58 year old white female.  She has iron deficiency anemia.  She has multiple other problems.  I just wish that she would feel better.  I thought the iron that we gave her  would have made her feel better but it has not.  We will see what the hepatobiliary scan is.  We will see what it shows.  I would like to see her back in about 5 weeks.  I just feel that we have to stay in close follow-up with her given some of the issues that she is dealing with right now.   Josph Macho, MD 1/22/20203:15 PM

## 2018-06-16 ENCOUNTER — Telehealth: Payer: Self-pay | Admitting: Family

## 2018-06-16 ENCOUNTER — Other Ambulatory Visit: Payer: Self-pay | Admitting: Family

## 2018-06-16 DIAGNOSIS — R1011 Right upper quadrant pain: Secondary | ICD-10-CM

## 2018-06-16 NOTE — Telephone Encounter (Signed)
Called and LMVM for patient with date/time/location of [place] [employer] with instructions-NPO 6 hrs prior

## 2018-06-19 ENCOUNTER — Ambulatory Visit (HOSPITAL_COMMUNITY): Payer: Medicare Other

## 2018-06-22 ENCOUNTER — Ambulatory Visit
Admission: RE | Admit: 2018-06-22 | Discharge: 2018-06-22 | Disposition: A | Discharge status: Home / Self Care | Payer: Medicare Other | Source: Ambulatory Visit | Attending: Family | Admitting: Family

## 2018-06-22 DIAGNOSIS — R1011 Right upper quadrant pain: Secondary | ICD-10-CM | POA: Diagnosis present

## 2018-06-23 ENCOUNTER — Telehealth: Payer: Self-pay | Admitting: *Deleted

## 2018-06-23 NOTE — Telephone Encounter (Signed)
As noted below by Dr. Myna Hidalgo, I informed patient that the abdominal US was normal. Instructed her to call if she has any questions or concerns.

## 2018-06-23 NOTE — Telephone Encounter (Signed)
-----   Message from Josph Macho, MD sent at 06/22/2018  4:00 PM EST ----- Call - the U/S of the abd is normal!!  Cindee Lame

## 2018-07-14 ENCOUNTER — Ambulatory Visit: Payer: Medicare Other | Admitting: Hematology & Oncology

## 2018-07-14 ENCOUNTER — Other Ambulatory Visit: Payer: Medicare Other

## 2018-08-23 ENCOUNTER — Ambulatory Visit: Payer: Medicare Other | Admitting: Hematology & Oncology

## 2018-08-23 ENCOUNTER — Other Ambulatory Visit: Payer: Medicare Other

## 2019-01-10 ENCOUNTER — Other Ambulatory Visit: Payer: Self-pay | Admitting: Neurosurgery

## 2019-01-10 DIAGNOSIS — M4316 Spondylolisthesis, lumbar region: Secondary | ICD-10-CM

## 2019-01-30 ENCOUNTER — Other Ambulatory Visit: Payer: Medicare Other

## 2019-08-03 ENCOUNTER — Other Ambulatory Visit: Payer: Self-pay | Admitting: Neurosurgery

## 2019-08-03 DIAGNOSIS — M4316 Spondylolisthesis, lumbar region: Secondary | ICD-10-CM

## 2019-08-31 ENCOUNTER — Other Ambulatory Visit: Payer: Self-pay

## 2019-08-31 ENCOUNTER — Ambulatory Visit
Admission: RE | Admit: 2019-08-31 | Discharge: 2019-08-31 | Disposition: A | Discharge status: Home / Self Care | Payer: Medicare Other | Source: Ambulatory Visit | Attending: Neurosurgery | Admitting: Neurosurgery

## 2019-08-31 DIAGNOSIS — M4316 Spondylolisthesis, lumbar region: Secondary | ICD-10-CM

## 2019-08-31 MED ORDER — GADOBENATE DIMEGLUMINE 529 MG/ML IV SOLN
19.0000 mL | Freq: Once | INTRAVENOUS | Status: AC | PRN
  Administered 2019-08-31: 19 mL via INTRAVENOUS

## 2019-11-14 ENCOUNTER — Ambulatory Visit: Payer: Medicare Other | Admitting: Registered"

## 2020-01-11 ENCOUNTER — Encounter: Payer: Self-pay | Admitting: Emergency Medicine

## 2020-01-11 ENCOUNTER — Other Ambulatory Visit: Payer: Self-pay

## 2020-01-11 ENCOUNTER — Emergency Department: Payer: Medicare Other

## 2020-01-11 ENCOUNTER — Inpatient Hospital Stay
Admission: EM | Admit: 2020-01-11 | Discharge: 2020-01-13 | DRG: 372 | Disposition: A | Discharge status: Home / Self Care | Payer: Medicare Other | Attending: Internal Medicine | Admitting: Internal Medicine

## 2020-01-11 ENCOUNTER — Observation Stay: Payer: Medicare Other

## 2020-01-11 DIAGNOSIS — Z96651 Presence of right artificial knee joint: Secondary | ICD-10-CM | POA: Diagnosis present

## 2020-01-11 DIAGNOSIS — Z90710 Acquired absence of both cervix and uterus: Secondary | ICD-10-CM

## 2020-01-11 DIAGNOSIS — Z91030 Bee allergy status: Secondary | ICD-10-CM

## 2020-01-11 DIAGNOSIS — G629 Polyneuropathy, unspecified: Secondary | ICD-10-CM | POA: Diagnosis present

## 2020-01-11 DIAGNOSIS — Z862 Personal history of diseases of the blood and blood-forming organs and certain disorders involving the immune mechanism: Secondary | ICD-10-CM

## 2020-01-11 DIAGNOSIS — Z79899 Other long term (current) drug therapy: Secondary | ICD-10-CM

## 2020-01-11 DIAGNOSIS — A040 Enteropathogenic Escherichia coli infection: Secondary | ICD-10-CM | POA: Diagnosis present

## 2020-01-11 DIAGNOSIS — Z79891 Long term (current) use of opiate analgesic: Secondary | ICD-10-CM

## 2020-01-11 DIAGNOSIS — E669 Obesity, unspecified: Secondary | ICD-10-CM | POA: Diagnosis present

## 2020-01-11 DIAGNOSIS — R1013 Epigastric pain: Secondary | ICD-10-CM | POA: Diagnosis not present

## 2020-01-11 DIAGNOSIS — Z886 Allergy status to analgesic agent status: Secondary | ICD-10-CM

## 2020-01-11 DIAGNOSIS — Z791 Long term (current) use of non-steroidal anti-inflammatories (NSAID): Secondary | ICD-10-CM

## 2020-01-11 DIAGNOSIS — Z888 Allergy status to other drugs, medicaments and biological substances status: Secondary | ICD-10-CM

## 2020-01-11 DIAGNOSIS — D72829 Elevated white blood cell count, unspecified: Secondary | ICD-10-CM

## 2020-01-11 DIAGNOSIS — D509 Iron deficiency anemia, unspecified: Secondary | ICD-10-CM | POA: Diagnosis present

## 2020-01-11 DIAGNOSIS — R109 Unspecified abdominal pain: Secondary | ICD-10-CM

## 2020-01-11 DIAGNOSIS — Z20822 Contact with and (suspected) exposure to covid-19: Secondary | ICD-10-CM | POA: Diagnosis present

## 2020-01-11 DIAGNOSIS — R112 Nausea with vomiting, unspecified: Secondary | ICD-10-CM | POA: Diagnosis not present

## 2020-01-11 DIAGNOSIS — G43909 Migraine, unspecified, not intractable, without status migrainosus: Secondary | ICD-10-CM | POA: Diagnosis present

## 2020-01-11 DIAGNOSIS — M1990 Unspecified osteoarthritis, unspecified site: Secondary | ICD-10-CM | POA: Diagnosis present

## 2020-01-11 DIAGNOSIS — A044 Other intestinal Escherichia coli infections: Secondary | ICD-10-CM | POA: Diagnosis not present

## 2020-01-11 DIAGNOSIS — R1010 Upper abdominal pain, unspecified: Secondary | ICD-10-CM

## 2020-01-11 DIAGNOSIS — M549 Dorsalgia, unspecified: Secondary | ICD-10-CM | POA: Diagnosis present

## 2020-01-11 DIAGNOSIS — Z8669 Personal history of other diseases of the nervous system and sense organs: Secondary | ICD-10-CM

## 2020-01-11 DIAGNOSIS — Z91013 Allergy to seafood: Secondary | ICD-10-CM

## 2020-01-11 DIAGNOSIS — Z6835 Body mass index (BMI) 35.0-35.9, adult: Secondary | ICD-10-CM

## 2020-01-11 DIAGNOSIS — F32A Depression, unspecified: Secondary | ICD-10-CM | POA: Diagnosis present

## 2020-01-11 DIAGNOSIS — K529 Noninfective gastroenteritis and colitis, unspecified: Secondary | ICD-10-CM | POA: Diagnosis present

## 2020-01-11 DIAGNOSIS — F329 Major depressive disorder, single episode, unspecified: Secondary | ICD-10-CM | POA: Diagnosis present

## 2020-01-11 DIAGNOSIS — G8929 Other chronic pain: Secondary | ICD-10-CM | POA: Diagnosis present

## 2020-01-11 DIAGNOSIS — R188 Other ascites: Secondary | ICD-10-CM | POA: Diagnosis present

## 2020-01-11 DIAGNOSIS — Z91018 Allergy to other foods: Secondary | ICD-10-CM

## 2020-01-11 DIAGNOSIS — M797 Fibromyalgia: Secondary | ICD-10-CM | POA: Diagnosis present

## 2020-01-11 DIAGNOSIS — G2581 Restless legs syndrome: Secondary | ICD-10-CM | POA: Diagnosis present

## 2020-01-11 DIAGNOSIS — F419 Anxiety disorder, unspecified: Secondary | ICD-10-CM | POA: Diagnosis present

## 2020-01-11 LAB — URINALYSIS, COMPLETE (UACMP) WITH MICROSCOPIC
Bacteria, UA: NONE SEEN
Bilirubin Urine: NEGATIVE
Glucose, UA: NEGATIVE mg/dL
Hgb urine dipstick: NEGATIVE
Ketones, ur: NEGATIVE mg/dL
Leukocytes,Ua: NEGATIVE
Nitrite: NEGATIVE
Protein, ur: NEGATIVE mg/dL
Specific Gravity, Urine: 1.011 (ref 1.005–1.030)
pH: 9 — ABNORMAL HIGH (ref 5.0–8.0)

## 2020-01-11 LAB — CBC
HCT: 41.8 % (ref 36.0–46.0)
Hemoglobin: 13.5 g/dL (ref 12.0–15.0)
MCH: 28.2 pg (ref 26.0–34.0)
MCHC: 32.3 g/dL (ref 30.0–36.0)
MCV: 87.4 fL (ref 80.0–100.0)
Platelets: 362 10*3/uL (ref 150–400)
RBC: 4.78 MIL/uL (ref 3.87–5.11)
RDW: 13.2 % (ref 11.5–15.5)
WBC: 15.2 10*3/uL — ABNORMAL HIGH (ref 4.0–10.5)
nRBC: 0 % (ref 0.0–0.2)

## 2020-01-11 LAB — COMPREHENSIVE METABOLIC PANEL
ALT: 13 U/L (ref 0–44)
AST: 18 U/L (ref 15–41)
Albumin: 4.2 g/dL (ref 3.5–5.0)
Alkaline Phosphatase: 99 U/L (ref 38–126)
Anion gap: 11 (ref 5–15)
BUN: 9 mg/dL (ref 6–20)
CO2: 24 mmol/L (ref 22–32)
Calcium: 9.1 mg/dL (ref 8.9–10.3)
Chloride: 105 mmol/L (ref 98–111)
Creatinine, Ser: 0.68 mg/dL (ref 0.44–1.00)
GFR calc Af Amer: >60 mL/min (ref >60)
GFR calc non Af Amer: >60 mL/min (ref >60)
Glucose, Bld: 97 mg/dL (ref 70–99)
Potassium: 4.4 mmol/L (ref 3.5–5.1)
Sodium: 140 mmol/L (ref 135–145)
Total Bilirubin: 0.8 mg/dL (ref 0.3–1.2)
Total Protein: 7.8 g/dL (ref 6.5–8.1)

## 2020-01-11 LAB — LACTIC ACID, PLASMA: Lactic Acid, Venous: 1.1 mmol/L (ref 0.5–1.9)

## 2020-01-11 LAB — SARS CORONAVIRUS 2 BY RT PCR (HOSPITAL ORDER, PERFORMED IN ~~LOC~~ HOSPITAL LAB): SARS Coronavirus 2: NEGATIVE

## 2020-01-11 LAB — LIPASE, BLOOD: Lipase: 22 U/L (ref 11–51)

## 2020-01-11 MED ORDER — SODIUM CHLORIDE 0.9 % IV SOLN
2.0000 g | INTRAVENOUS | Status: DC
  Administered 2020-01-11 – 2020-01-12 (×2): 2 g via INTRAVENOUS
  Filled 2020-01-11: qty 20
  Filled 2020-01-11: qty 2

## 2020-01-11 MED ORDER — CAPSAICIN 0.025 % EX CREA
TOPICAL_CREAM | Freq: Two times a day (BID) | CUTANEOUS | Status: DC
  Filled 2020-01-11: qty 60

## 2020-01-11 MED ORDER — HYDROMORPHONE HCL 1 MG/ML IJ SOLN
1.0000 mg | Freq: Once | INTRAMUSCULAR | Status: AC
  Administered 2020-01-11: 1 mg via INTRAVENOUS
  Filled 2020-01-11: qty 1

## 2020-01-11 MED ORDER — ONDANSETRON HCL 4 MG/2ML IJ SOLN
4.0000 mg | Freq: Once | INTRAMUSCULAR | Status: AC
  Administered 2020-01-11: 4 mg via INTRAVENOUS
  Filled 2020-01-11: qty 2

## 2020-01-11 MED ORDER — HYDROMORPHONE HCL 1 MG/ML IJ SOLN: 0.5000 mg | INTRAMUSCULAR | Status: DC | PRN

## 2020-01-11 MED ORDER — OXYCODONE HCL 5 MG PO TABS
5.0000 mg | ORAL_TABLET | ORAL | Status: DC | PRN
  Administered 2020-01-11 – 2020-01-12 (×3): 5 mg via ORAL
  Filled 2020-01-11 (×3): qty 1

## 2020-01-11 MED ORDER — ENOXAPARIN SODIUM 40 MG/0.4ML ~~LOC~~ SOLN
40.0000 mg | SUBCUTANEOUS | Status: DC
  Administered 2020-01-11 – 2020-01-12 (×2): 40 mg via SUBCUTANEOUS
  Filled 2020-01-11 (×2): qty 0.4

## 2020-01-11 MED ORDER — MORPHINE SULFATE (PF) 4 MG/ML IV SOLN
4.0000 mg | Freq: Once | INTRAVENOUS | Status: AC
  Administered 2020-01-11: 4 mg via INTRAVENOUS
  Filled 2020-01-11: qty 1

## 2020-01-11 MED ORDER — ONDANSETRON HCL 4 MG/2ML IJ SOLN
4.0000 mg | Freq: Four times a day (QID) | INTRAMUSCULAR | Status: DC | PRN
  Administered 2020-01-11 – 2020-01-13 (×4): 4 mg via INTRAVENOUS
  Filled 2020-01-11 (×4): qty 2

## 2020-01-11 MED ORDER — ACETAMINOPHEN 325 MG PO TABS: 650.0000 mg | ORAL_TABLET | Freq: Four times a day (QID) | ORAL | Status: DC | PRN

## 2020-01-11 MED ORDER — METRONIDAZOLE IN NACL 5-0.79 MG/ML-% IV SOLN
500.0000 mg | Freq: Three times a day (TID) | INTRAVENOUS | Status: DC
  Administered 2020-01-11 – 2020-01-13 (×5): 500 mg via INTRAVENOUS
  Filled 2020-01-11 (×6): qty 100

## 2020-01-11 MED ORDER — HYDROMORPHONE HCL 1 MG/ML PO LIQD
2.0000 mg | ORAL | Status: DC | PRN
  Administered 2020-01-11: 2 mg via ORAL
  Filled 2020-01-11: qty 2

## 2020-01-11 MED ORDER — ACETAMINOPHEN 650 MG RE SUPP: 650.0000 mg | Freq: Four times a day (QID) | RECTAL | Status: DC | PRN

## 2020-01-11 MED ORDER — ONDANSETRON HCL 4 MG PO TABS
4.0000 mg | ORAL_TABLET | Freq: Four times a day (QID) | ORAL | Status: DC | PRN
  Administered 2020-01-12: 4 mg via ORAL
  Filled 2020-01-11: qty 1

## 2020-01-11 MED ORDER — LACTATED RINGERS IV SOLN: INTRAVENOUS | Status: DC

## 2020-01-11 MED ORDER — MORPHINE SULFATE (PF) 2 MG/ML IV SOLN: 2.0000 mg | INTRAVENOUS | Status: DC | PRN

## 2020-01-11 MED ORDER — IOHEXOL 300 MG/ML  SOLN
100.0000 mL | Freq: Once | INTRAMUSCULAR | Status: AC | PRN
  Administered 2020-01-11: 100 mL via INTRAVENOUS
  Filled 2020-01-11: qty 100

## 2020-01-11 MED ORDER — SODIUM CHLORIDE 0.9 % IV SOLN
1000.0000 mL | Freq: Once | INTRAVENOUS | Status: AC
  Administered 2020-01-11: 1000 mL via INTRAVENOUS

## 2020-01-11 NOTE — ED Notes (Signed)
US at the bedside

## 2020-01-11 NOTE — ED Triage Notes (Signed)
Pt here with c/o lower abd cramping and pain that began this am, denies any pain with urination, however, did notice some blood in her urine at few days ago. Has had a full hysterectomy, states when she stands up the pain is so intense it makes her double over, some nausea, and diarrhea today. NAD.

## 2020-01-11 NOTE — ED Notes (Signed)
Pt transported to CT ?

## 2020-01-11 NOTE — ED Notes (Signed)
Pt states she has abdominal pain in the center of her abdomen above her belly button that is radiating upwards. Pt states this morning that her pain came on suddenly and that it is sharp. Pt states at the time she began having diarrhea.

## 2020-01-11 NOTE — H&P (Signed)
History and Physical    [name] ZOX:[id number] DOB: 26-Apr-1961 DOA: 01/11/2020  PCP: Kaleen Mask, MD  Patient coming from: home  I have personally briefly reviewed patient's old medical records in [facility]  Chief Complaint: abdominal pain  HPI: [name] is a 59 y.o. female with medical history significant of migraines, iron deficiency, depression/anxiety, chronic back pain s/p multiple surgeries who presented to the ED with abdominal pain, nausea with dry heaving and loose stool.  Reports the pain work her up suddenly from sleep this AM around 7:00 and has been constant ever since.  Describes pain as sharp in nature.  When it started, says it was midline just above her belly button, and later moved up more epigastric and spreading across her upper abdomen more so to the right side.  Has been 10 out of 10 in severity all day, so severe she almost fell to the ground at home.  The pain is worse with almost any movement, especially standing up straight or leaning backward (stretches the abdomen).  She reported dry heaves, no actual emesis.  Took Zofran which helped it ease up.  Denies watery diarrhea, but has had loose stool (most recently about 2 hours ago).  She has prescribed morphine for her back which she took without any relief.  Denies any fevers, but some chill which she says is due to the pain.  No unusual/new foods, undercooked foods.  No sick contacts.  No other recent illnesses.  She does have history of ventral hernia repaired with mesh, has never had issues with that.  Despite IV morphine and Dilaudid in the ED, she remains in significant pain.  Otherwise has been well recently.  Denies cough, congestion, sore throat, dysuria or other symptoms.  ED Course: vitals within normal aside from BP 150/88.  CBC with leukocytosis of 15.2k, otherwise normal. CMP and lipase were normal.  Lactic acid 1.1.  UA negative.    CT abdomen/pelvis impression: There are several  loops of distal ileum with bowel wall thickening and adjacent mesenteric fat stranding, consistent with enteritis, which may be infectious or inflammatory in etiology. Small amount of abdominopelvic ascites, likely reactive.  She was given morphine and dilaudid in the ED with no relief of her pain.  Admitted to hospitalist service with GI consulted for further evaluation and management.    Review of Systems: As per HPI otherwise 10 point review of systems negative.    Past Medical History:  Diagnosis Date  . Anemia   . Anxiety   . Arthritis    multiple areas  . Bone marrow disease    DX many years ago - pt unsure of name and unable to find info on type of disease  . Complication of anesthesia 2011   slow to awaken after hysterectomy  . Depression   . Dyspnea    if does not take albuterol she states she can not breath   . Family history of adverse reaction to anesthesia    pts mother has nausea and vomiting; pts sons become aggressive and have hallicunations  . Fibromyalgia   . H/O iron deficiency anemia   . Headache    MIGRAINES  . History of blood transfusion    day pt was born  . History of bronchitis   . Hx of pneumothorax    X2 IN PAST  . Kidney disease    only has one functioning kidney - no problems  x 23 yrs  .  Liver failure, acute 5 years ago   cause unknown, after hysterectomy  . Neuropathy   . Numbness    MULTIPLE AREAS - "from all the surgeries I've had"  . PONV (postoperative nausea and vomiting)     Past Surgical History:  Procedure Laterality Date  . ABDOMINAL HYSTERECTOMY    . BACK SURGERY     multiple  . CERVICAL  SPINE SURG     SEVERAL TIMES  . CERVICAL DISCECTOMY     with fusion  . DILATION AND CURETTAGE OF UTERUS    . FOOT SURGERY  1989  . HERNIA REPAIR  2011  . KNEE ARTHROSCOPY WITH DRILLING/MICROFRACTURE Right 03/13/2014   Procedure: RIGHT MICROFRACTURE TECHNIQUE;  Surgeon: Jacki Cones, MD;  Location: [facility];  Service: Orthopedics;   Laterality: Right;  . KNEE ARTHROSCOPY WITH MEDIAL MENISECTOMY Right 03/13/2014   Procedure: RIGHT KNEE ARTHROSCOPY WITH MEDIAL FEMORAL CONDYLE REPAIR;  Surgeon: Jacki Cones, MD;  Location: [facility];  Service: Orthopedics;  Laterality: Right;  . KNEE ARTHROSCOPY WITH MEDIAL PATELLAR FEMORAL LIGAMENT RECONSTRUCTION Right 03/13/2014   Procedure: ABRASION CHONDROPLASTY OF MEDIAL FEMORAL;  Surgeon: Jacki Cones, MD;  Location: [facility];  Service: Orthopedics;  Laterality: Right;  . left knee arthroscopy    . polyp removal  2010   uterine  . right leg surgery      secondary to trauma  . SHOULDER SURGERY     Bilaterally X9 SURGERIES  . SYNOVECTOMY Right 03/13/2014   Procedure: SYNOVECTOMY OF THE SUPRAPATELLA POUCH;  Surgeon: Jacki Cones, MD;  Location: [facility];  Service: Orthopedics;  Laterality: Right;  . TONSILLECTOMY    . TOTAL KNEE ARTHROPLASTY Right 07/30/2014   Procedure: RIGHT TOTAL KNEE ARTHROPLASTY;  Surgeon: Ranee Gosselin, MD;  Location: [facility];  Service: Orthopedics;  Laterality: Right;  . TOTAL KNEE REVISION WITH SCAR DEBRIDEMENT/PATELLA REVISION WITH POLY EXCHANGE Right 04/05/2016   Procedure: OPEN SCAR DEBRIDEMENT WITH POLY EXCHANGE reviosion of patella;  Surgeon: Durene Romans, MD;  Location: [facility];  Service: Orthopedics;  Laterality: Right;     reports that she has never smoked. She has never used smokeless tobacco. She reports that she does not drink alcohol and does not use drugs.  Allergies  Allergen Reactions  . Fish Allergy Anaphylaxis    To all fish, not just shellfish.  Cannot tolerate even the smell of seafood without her airway swelling/closing.  . Other Shortness Of Breath    Whipped cream and pudding  . Nortriptyline Other (See Comments)    hallucinations  . Cyclobenzaprine   . Tylenol [Acetaminophen] Other (See Comments)    Cannot take due to past liver failure  . Bee Venom Hives  . Flexeril [Cyclobenzaprine Hcl] Other (See Comments)    Intense anger     Family History  Problem Relation Age of Onset  . Heart disease Mother   . Heart disease Father   . Colon cancer Father   . Heart disease Maternal Grandfather   . Heart disease Maternal Grandmother   . Heart disease Paternal Grandfather   . Heart disease Paternal Grandmother      Prior to Admission medications   Medication Sig Start Date End Date Taking? Authorizing Provider  albuterol (VOSPIRE ER) 8 MG 12 hr tablet Take 2 tablets by mouth 2 (two) times daily.  07/30/10   [provider]  amitriptyline (ELAVIL) 10 MG tablet Take 10 mg by mouth daily. Take three tablets, total of 30 mg at bedtime.  [provider]  buPROPion (WELLBUTRIN XL) 150 MG 24 hr tablet Take 300 mg by mouth every morning.     [provider]  celecoxib (CELEBREX) 200 MG capsule Take 1 capsule (200 mg total) by mouth every 12 (twelve) hours. Take along with Pepcid to help avoid GI upset. 04/06/16   Lanney Gins, PA-C  EPIPEN 2-PAK 0.3 MG/0.3ML SOAJ injection Inject 0.3 mg as directed as needed (allergic reaction).  02/18/16   [provider]  hydrOXYzine (ATARAX/VISTARIL) 50 MG tablet Take 50 mg by mouth 2 (two) times daily as needed for anxiety.     [provider]  morphine (MSIR) 15 MG tablet Take 15 mg by mouth as needed for severe pain.    [provider]  Multiple Vitamins-Minerals (HAIR SKIN AND NAILS FORMULA) TABS Take 2 tablets by mouth daily.    [provider]  ondansetron (ZOFRAN) 8 MG tablet Take 8 mg by mouth 3 (three) times daily as needed for nausea or vomiting.  02/18/16   [provider]  rOPINIRole (REQUIP) 2 MG tablet Take 1 mg by mouth at bedtime.     [provider]  topiramate (TOPAMAX) 25 MG tablet Take 25-50 mg by mouth 2 (two) times daily. Take 50mg  in the morning and 25mg s at night 01/02/16   [provider]  trimethoprim-polymyxin b (POLYTRIM) ophthalmic solution polymyxin B sulfate 10,000  unit-trimethoprim 1 mg/mL eye drops    [provider]    Physical Exam: Vitals:   01/11/20 1408 01/11/20 1409  BP: (!) 150/88   Pulse: 74   Resp: 16   Temp: 98.3 F (36.8 C)   TempSrc: Oral   SpO2: 100%   Weight:  94.3 kg  Height:  5\' 4"  (1.626 m)     Constitutional: NAD, calm, appears uncomfortable (restless, winces intermittently with movement) Eyes: EOMI, lids and conjunctivae normal ENMT: Mucous membranes are moist. Hearing grossly normal. Respiratory: CTAB, no wheezing, no crackles. Normal respiratory effort. No accessory muscle use.  Cardiovascular: RRR, no murmurs / rubs / gallops. No extremity edema.  Abdomen: soft, tenderness with guarding of mid/upper epigastric and worse toward the right mid/upper, RLQ mildly tender but no rebound tenderness.  Unable to appreciate bowel sounds.  No organomegaly appreciated. Musculoskeletal: no clubbing / cyanosis. No joint deformity upper and lower extremities. Normal muscle tone.  Skin: dry, intact, normal color, normal temperature Neurologic: CN 2-12 grossly intact. Normal speech.  Grossly non-focal exam. Psychiatric: Alert and oriented x 3. Normal mood. Congruent affect.  Normal judgement and insight.    Labs on Admission: I have personally reviewed following labs and imaging studies  CBC: Recent Labs  Lab 01/11/20 1411  WBC 15.2*  HGB 13.5  HCT 41.8  MCV 87.4  PLT 362   Basic Metabolic Panel: Recent Labs  Lab 01/11/20 1411  NA 140  K 4.4  CL 105  CO2 24  GLUCOSE 97  BUN 9  CREATININE 0.68  CALCIUM 9.1   GFR: Estimated Creatinine Clearance: 86.4 mL/min (by C-G formula based on SCr of 0.68 mg/dL). Liver Function Tests: Recent Labs  Lab 01/11/20 1411  AST 18  ALT 13  ALKPHOS 99  BILITOT 0.8  PROT 7.8  ALBUMIN 4.2   Recent Labs  Lab 01/11/20 1411  LIPASE 22   No results for input(s): AMMONIA in the last 168 hours. Coagulation Profile: No results for input(s): INR, PROTIME in the last 168  hours. Cardiac Enzymes: No results for input(s): CKTOTAL, CKMB, CKMBINDEX,  TROPONINI in the last 168 hours. BNP (last 3 results) No results for input(s): PROBNP in the last 8760 hours. HbA1C: No results for input(s): HGBA1C in the last 72 hours. CBG: No results for input(s): GLUCAP in the last 168 hours. Lipid Profile: No results for input(s): CHOL, HDL, LDLCALC, TRIG, CHOLHDL, LDLDIRECT in the last 72 hours. Thyroid Function Tests: No results for input(s): TSH, T4TOTAL, FREET4, T3FREE, THYROIDAB in the last 72 hours. Anemia Panel: No results for input(s): VITAMINB12, FOLATE, FERRITIN, TIBC, IRON, RETICCTPCT in the last 72 hours. Urine analysis:    Component Value Date/Time   COLORURINE YELLOW (A) 01/11/2020 1411   APPEARANCEUR CLEAR (A) 01/11/2020 1411   LABSPEC 1.011 01/11/2020 1411   PHURINE 9.0 (H) 01/11/2020 1411   GLUCOSEU NEGATIVE 01/11/2020 1411   HGBUR NEGATIVE 01/11/2020 1411   BILIRUBINUR NEGATIVE 01/11/2020 1411   KETONESUR NEGATIVE 01/11/2020 1411   PROTEINUR NEGATIVE 01/11/2020 1411   UROBILINOGEN 1.0 01/03/2009 0008   NITRITE NEGATIVE 01/11/2020 1411   LEUKOCYTESUR NEGATIVE 01/11/2020 1411    Radiological Exams on Admission: CT ABDOMEN PELVIS W CONTRAST  Result Date: 01/11/2020 CLINICAL DATA:  Pt here with c/o lower abd cramping and pain that began this am, denies any pain with urination, however, did notice some blood in her urine at few days ago. H EXAM: CT ABDOMEN AND PELVIS WITH CONTRAST TECHNIQUE: Multidetector CT imaging of the abdomen and pelvis was performed using the standard protocol following bolus administration of intravenous contrast. CONTRAST:  OMNIPAQUE IOHEXOL 300 MG/ML  SOLN COMPARISON:  CT abdomen pelvis 02/21/2017 FINDINGS: Lower chest: No acute abnormality. Hepatobiliary: No focal liver abnormality is seen. No gallstones, gallbladder wall thickening, or biliary dilatation. Pancreas: Unremarkable. No pancreatic ductal dilatation or  surrounding inflammatory changes. Spleen: Normal in size without focal abnormality. Adrenals/Urinary Tract: Adrenal glands are unremarkable. Kidneys are normal, without renal calculi, focal lesion, or hydronephrosis. Bladder is unremarkable. Stomach/Bowel: Stomach is within normal limits. Appendix is not visualized. There are several loops of distal ileum with bowel wall thickening and adjacent mesenteric fat stranding. No dilation to suggest obstruction. No free air. No pneumatosis. The remainder of the bowel is normal in appearance. Vascular/Lymphatic: No significant vascular findings are present. No enlarged abdominal or pelvic lymph nodes. Reproductive: Status post hysterectomy. No adnexal masses. Other: Small amount of abdominopelvic ascites, likely reactive. Musculoskeletal: Lumbar spine posterior fusion hardware. IMPRESSION: There are several loops of distal ileum with bowel wall thickening and adjacent mesenteric fat stranding, consistent with enteritis, which may be infectious or inflammatory in etiology. Small amount of abdominopelvic ascites, likely reactive. Electronically Signed   By: Emmaline Kluver M.D.   On: 01/11/2020 15:21    EKG: None  Assessment/Plan Principal Problem:   Intractable abdominal pain Active Problems:   Nausea & vomiting   Leukocytosis   Obesity   History of migraine   Chronic back pain   Hx of iron deficiency anemia   Depression    Intractable abdominal pain - present on admission.  Sudden onset, woke her from sleep, constant since onset, sharp, almost no relief from IV pain meds given in the ED.  CT findings as above, consistent with enteritis.  Appendix was not visualized on CT (no hx of appendectomy). --GI consulted --Empiric Rocephin and Flagyl for now --Stool studies ordered --IV fluids --RUQ ultrasound --IV Dilaudid PRN, if not helpful would try Fentanyl  --Antiemetics --Clear liquid diet for now  Nausea & vomiting - IV Zofran PRN  Leukocytosis  - WBC 15.2k on  admission.  Possibly reactive, but cannot otherwise rule out infection yet.  Antibiotics as above for now.  CBC's daily.  History of migraine - will resume home meds pending med history  Chronic back pain - seems stable.  Pain medications as above.  Hold home med for now.  Hx of iron deficiency anemia - not anemic at this time.  Has followed with heme/onc for iron infusions, last one was about 2 years ago.  Will check iron studies.  Depression / Anxiety - hold home meds for now  Obesity - Body mass index is 35.7 kg/m.  Has been losing weight intentionally.  Complicates overall care and prognosis.   DVT prophylaxis: enoxaparin (LOVENOX) injection 40 mg Start: 01/11/20 2200   Code Status: Full  Family Communication: husband at bedside during admission encounter  Disposition Plan: home pending improvement, anticipate 1-3 days  Consults called: GI, Dr. Allegra LaiVanga   Admission status:  Status is: Observation  The patient remains OBS appropriate and will d/c before 2 midnights.  Dispo: The patient is from: Home              Anticipated d/c is to: Home              Anticipated d/c date is: 1 day              Patient currently is not medically stable to d/c.     Pennie BanterKelly A Xylan Sheils, DO [facility]  01/11/2020, 6:01 PM    If 7PM-7AM, please contact night-coverage. How to contact the [facility] Attending or Consulting provider 7A - 7P or covering provider during after hours 7P -7A, for this patient?    1. Check the care team in [facility] and look for a) attending/consulting [facility] provider listed and b) the [facility] team listed 2. Log into [email or web address] and use [facility]'s universal password to access. If you do not have the password, please contact the hospital operator. 3. Locate the [facility] provider you are looking for under [facility] and page to a number that you can be directly reached. 4. If you still have difficulty reaching the provider, please page the [facility] (Director on  Call) for the Hospitalists listed on amion for assistance.

## 2020-01-11 NOTE — ED Provider Notes (Signed)
[facility] Emergency Department Provider Note   ____________________________________________    I have reviewed the triage vital signs and the nursing notes.   HISTORY  Chief Complaint Abdominal Pain     HPI [name] is a 59 y.o. female who presents with complaints of abdominal pain.  Patient notes pain started this morning at approximately 7:30 AM.  She felt well yesterday.  She reports sharp pain in her epigastrium extending to the sides bilaterally and some discomfort periumbilically.  No history of GERD PUD.  Mild nausea no vomiting.  No fevers or chills.  Normal stools.  Has not take anything for this.  No radiation of pain.  History of hysterectomy, no other abdominal surgeries.   Past Medical History:  Diagnosis Date  . Anemia   . Anxiety   . Arthritis    multiple areas  . Bone marrow disease    DX many years ago - pt unsure of name and unable to find info on type of disease  . Complication of anesthesia 2011   slow to awaken after hysterectomy  . Depression   . Dyspnea    if does not take albuterol she states she can not breath   . Family history of adverse reaction to anesthesia    pts mother has nausea and vomiting; pts sons become aggressive and have hallicunations  . Fibromyalgia   . H/O iron deficiency anemia   . Headache    MIGRAINES  . History of blood transfusion    day pt was born  . History of bronchitis   . Hx of pneumothorax    X2 IN PAST  . Kidney disease    only has one functioning kidney - no problems  x 23 yrs  . Liver failure, acute 5 years ago   cause unknown, after hysterectomy  . Neuropathy   . Numbness    MULTIPLE AREAS - "from all the surgeries I've had"  . PONV (postoperative nausea and vomiting)     Patient Active Problem List   Diagnosis Date Noted  . Intractable abdominal pain 01/11/2020  . 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 History of total knee arthroplasty 07/30/2014  .  Osteoarthritis of right knee 03/13/2014  . Tear of medial meniscus of right knee, current 03/13/2014  . Kidney disease 08/13/2010  . Anemia 08/13/2010  . SOB (shortness of breath) 08/13/2010  . Abnormal uterine bleeding 08/13/2010  . Morbid obesity (HCC) 08/13/2010  . Depression     Past Surgical History:  Procedure Laterality Date  . ABDOMINAL HYSTERECTOMY    . BACK SURGERY     multiple  . CERVICAL  SPINE SURG     SEVERAL TIMES  . CERVICAL DISCECTOMY     with fusion  . DILATION AND CURETTAGE OF UTERUS    . FOOT SURGERY  1989  . HERNIA REPAIR  2011  . KNEE ARTHROSCOPY WITH DRILLING/MICROFRACTURE Right 03/13/2014   Procedure: RIGHT MICROFRACTURE TECHNIQUE;  Surgeon: Jacki Cones, MD;  Location: [facility];  Service: Orthopedics;  Laterality: Right;  . KNEE ARTHROSCOPY WITH MEDIAL MENISECTOMY Right 03/13/2014   Procedure: RIGHT KNEE ARTHROSCOPY WITH MEDIAL FEMORAL CONDYLE REPAIR;  Surgeon: Jacki Cones, MD;  Location: [facility];  Service: Orthopedics;  Laterality: Right;  . KNEE ARTHROSCOPY WITH MEDIAL PATELLAR FEMORAL LIGAMENT RECONSTRUCTION Right 03/13/2014   Procedure: ABRASION CHONDROPLASTY OF MEDIAL FEMORAL;  Surgeon: Jacki Cones, MD;  Location: [facility];  Service: Orthopedics;  Laterality: Right;  .  left knee arthroscopy    . polyp removal  2010   uterine  . right leg surgery      secondary to trauma  . SHOULDER SURGERY     Bilaterally X9 SURGERIES  . SYNOVECTOMY Right 03/13/2014   Procedure: SYNOVECTOMY OF THE SUPRAPATELLA POUCH;  Surgeon: Jacki Cones, MD;  Location: [facility];  Service: Orthopedics;  Laterality: Right;  . TONSILLECTOMY    . TOTAL KNEE ARTHROPLASTY Right 07/30/2014   Procedure: RIGHT TOTAL KNEE ARTHROPLASTY;  Surgeon: Ranee Gosselin, MD;  Location: [facility];  Service: Orthopedics;  Laterality: Right;  . TOTAL KNEE REVISION WITH SCAR DEBRIDEMENT/PATELLA REVISION WITH POLY EXCHANGE Right 04/05/2016   Procedure: OPEN SCAR DEBRIDEMENT WITH POLY EXCHANGE  reviosion of patella;  Surgeon: Durene Romans, MD;  Location: [facility];  Service: Orthopedics;  Laterality: Right;    Prior to Admission medications   Medication Sig Start Date End Date Taking? Authorizing Provider  albuterol (VOSPIRE ER) 8 MG 12 hr tablet Take 2 tablets by mouth 2 (two) times daily.  07/30/10   [provider]  amitriptyline (ELAVIL) 10 MG tablet Take 10 mg by mouth daily. Take three tablets, total of 30 mg at bedtime.    [provider]  buPROPion (WELLBUTRIN XL) 150 MG 24 hr tablet Take 300 mg by mouth every morning.     [provider]  celecoxib (CELEBREX) 200 MG capsule Take 1 capsule (200 mg total) by mouth every 12 (twelve) hours. Take along with Pepcid to help avoid GI upset. 04/06/16   Lanney Gins, PA-C  EPIPEN 2-PAK 0.3 MG/0.3ML SOAJ injection Inject 0.3 mg as directed as needed (allergic reaction).  02/18/16   [provider]  hydrOXYzine (ATARAX/VISTARIL) 50 MG tablet Take 50 mg by mouth 2 (two) times daily as needed for anxiety.     [provider]  morphine (MSIR) 15 MG tablet Take 15 mg by mouth as needed for severe pain.    [provider]  Multiple Vitamins-Minerals (HAIR SKIN AND NAILS FORMULA) TABS Take 2 tablets by mouth daily.    [provider]  ondansetron (ZOFRAN) 8 MG tablet Take 8 mg by mouth 3 (three) times daily as needed for nausea or vomiting.  02/18/16   [provider]  rOPINIRole (REQUIP) 2 MG tablet Take 1 mg by mouth at bedtime.     [provider]  topiramate (TOPAMAX) 25 MG tablet Take 25-50 mg by mouth 2 (two) times daily. Take 50mg  in the morning and 25mg s at night 01/02/16   [provider]  trimethoprim-polymyxin b (POLYTRIM) ophthalmic solution polymyxin B sulfate 10,000 unit-trimethoprim 1 mg/mL eye drops    [provider]     Allergies Fish allergy, Other, Nortriptyline, Cyclobenzaprine, Tylenol [acetaminophen], Bee venom, and Flexeril  [cyclobenzaprine hcl]  Family History  Problem Relation Age of Onset  . Heart disease Mother   . Heart disease Father   . Colon cancer Father   . Heart disease Maternal Grandfather   . Heart disease Maternal Grandmother   . Heart disease Paternal Grandfather   . Heart disease Paternal Grandmother     Social History Social History   Tobacco Use  . Smoking status: Never Smoker  . Smokeless tobacco: Never Used  Vaping Use  . Vaping Use: Never used  Substance Use Topics  . Alcohol use: No  . Drug use: No    Review of Systems  Constitutional: No fever/chills Eyes: No visual changes.  ENT: No sore throat. Cardiovascular:  Denies chest pain. Respiratory: Denies shortness of breath. Gastrointestinal: As above Genitourinary: No dysuria Musculoskeletal: Negative for back pain. Skin: Negative for rash. Neurological: Negative for headaches or weakness   ____________________________________________   PHYSICAL EXAM:  VITAL SIGNS: ED Triage Vitals  Enc Vitals Group     BP 01/11/20 1408 (!) 150/88     Pulse Rate 01/11/20 1408 74     Resp 01/11/20 1408 16     Temp 01/11/20 1408 98.3 F (36.8 C)     Temp Source 01/11/20 1408 Oral     SpO2 01/11/20 1408 100 %     Weight 01/11/20 1409 94.3 kg (208 lb)     Height 01/11/20 1409 1.626 m (5\' 4" )     Head Circumference --      Peak Flow --      Pain Score 01/11/20 1409 9     Pain Loc --      Pain Edu? --      Excl. in GC? --     Constitutional: Alert and oriented.   Nose: No congestion/rhinnorhea. Mouth/Throat: Mucous membranes are moist.   Neck:  Painless ROM Cardiovascular: Normal rate, regular rhythm. Grossly normal heart sounds.  Good peripheral circulation. Respiratory: Normal respiratory effort.  No retractions. Lungs CTAB. Gastrointestinal: Tenderness in the epigastrium and periumbilically, mild distention.   No CVA tenderness. Genitourinary: deferred Musculoskeletal: .  Warm and well perfused Neurologic:   Normal speech and language. No gross focal neurologic deficits are appreciated.  Skin:  Skin is warm, dry and intact. No rash noted. Psychiatric: Mood and affect are normal. Speech and behavior are normal.  ____________________________________________   LABS (all labs ordered are listed, but only abnormal results are displayed)  Labs Reviewed  CBC - Abnormal; Notable for the following components:      Result Value   WBC 15.2 (*)    All other components within normal limits  URINALYSIS, COMPLETE (UACMP) WITH MICROSCOPIC - Abnormal; Notable for the following components:   Color, Urine YELLOW (*)    APPearance CLEAR (*)    pH 9.0 (*)    All other components within normal limits  SARS CORONAVIRUS 2 BY RT PCR (HOSPITAL ORDER, PERFORMED IN [facility])  GASTROINTESTINAL PANEL BY PCR, STOOL (REPLACES STOOL CULTURE)  LIPASE, BLOOD  COMPREHENSIVE METABOLIC PANEL  LACTIC ACID, PLASMA  HIV ANTIBODY (ROUTINE TESTING W REFLEX)  CBC  COMPREHENSIVE METABOLIC PANEL  CREATININE, SERUM  CBC   ____________________________________________  EKG  None ____________________________________________  RADIOLOGY  CT abdomen pelvis demonstrates bowel wall thickening in the distal ileum with mesenteric fat stranding possible enteritis ____________________________________________   PROCEDURES  Procedure(s) performed: No  Procedures   Critical Care performed: No ____________________________________________   INITIAL IMPRESSION / ASSESSMENT AND PLAN / ED COURSE  Pertinent labs & imaging results that were available during my care of the patient were reviewed by me and considered in my medical decision making (see chart for details).  Patient presents with epigastric nominal pain as described above.  Differential includes gastritis/PUD, pancreatitis, enteritis/colitis  We'll treat with IV morphine, IV Zofran, IV fluids  Lab work demonstrates normal lipase, normal LFTs.  CBC  demonstrates elevated white blood cell count of 15.2.  Urinalysis is unremarkable.  Will obtain CT abdomen pelvis to evaluate further.  CT scan demonstrates thickened loops of bowel most consistent with enteritis, no other abnormal findings noted.  Reevaluated patient, continues to have pain despite IV morphine, will give IV Dilaudid and reevaluate.  Will send  lactic acid as well.  Lactic acid normal.  Patient with continued pain after dilaudid, have discussed with the hospitalist for admission given intractable abd pain      ____________________________________________   FINAL CLINICAL IMPRESSION(S) / ED DIAGNOSES  Final diagnoses:  Pain of upper abdomen  Intractable abdominal pain        Note:  This document was prepared using Dragon voice recognition software and may include unintentional dictation errors.   Jene Every, MD 01/11/20 1755

## 2020-01-12 ENCOUNTER — Other Ambulatory Visit: Payer: Self-pay

## 2020-01-12 ENCOUNTER — Encounter: Payer: Self-pay | Admitting: Internal Medicine

## 2020-01-12 DIAGNOSIS — Z90710 Acquired absence of both cervix and uterus: Secondary | ICD-10-CM | POA: Diagnosis not present

## 2020-01-12 DIAGNOSIS — Z8669 Personal history of other diseases of the nervous system and sense organs: Secondary | ICD-10-CM | POA: Diagnosis not present

## 2020-01-12 DIAGNOSIS — K529 Noninfective gastroenteritis and colitis, unspecified: Secondary | ICD-10-CM | POA: Diagnosis not present

## 2020-01-12 DIAGNOSIS — Z79891 Long term (current) use of opiate analgesic: Secondary | ICD-10-CM | POA: Diagnosis not present

## 2020-01-12 DIAGNOSIS — M1990 Unspecified osteoarthritis, unspecified site: Secondary | ICD-10-CM | POA: Diagnosis present

## 2020-01-12 DIAGNOSIS — D509 Iron deficiency anemia, unspecified: Secondary | ICD-10-CM | POA: Diagnosis present

## 2020-01-12 DIAGNOSIS — G629 Polyneuropathy, unspecified: Secondary | ICD-10-CM | POA: Diagnosis present

## 2020-01-12 DIAGNOSIS — G2581 Restless legs syndrome: Secondary | ICD-10-CM | POA: Diagnosis present

## 2020-01-12 DIAGNOSIS — R109 Unspecified abdominal pain: Secondary | ICD-10-CM | POA: Diagnosis not present

## 2020-01-12 DIAGNOSIS — E669 Obesity, unspecified: Secondary | ICD-10-CM | POA: Diagnosis present

## 2020-01-12 DIAGNOSIS — Z96651 Presence of right artificial knee joint: Secondary | ICD-10-CM | POA: Diagnosis present

## 2020-01-12 DIAGNOSIS — Z888 Allergy status to other drugs, medicaments and biological substances status: Secondary | ICD-10-CM | POA: Diagnosis not present

## 2020-01-12 DIAGNOSIS — F419 Anxiety disorder, unspecified: Secondary | ICD-10-CM | POA: Diagnosis present

## 2020-01-12 DIAGNOSIS — Z91018 Allergy to other foods: Secondary | ICD-10-CM | POA: Diagnosis not present

## 2020-01-12 DIAGNOSIS — Z791 Long term (current) use of non-steroidal anti-inflammatories (NSAID): Secondary | ICD-10-CM | POA: Diagnosis not present

## 2020-01-12 DIAGNOSIS — Z91030 Bee allergy status: Secondary | ICD-10-CM | POA: Diagnosis not present

## 2020-01-12 DIAGNOSIS — G43909 Migraine, unspecified, not intractable, without status migrainosus: Secondary | ICD-10-CM | POA: Diagnosis present

## 2020-01-12 DIAGNOSIS — R112 Nausea with vomiting, unspecified: Secondary | ICD-10-CM

## 2020-01-12 DIAGNOSIS — Z20822 Contact with and (suspected) exposure to covid-19: Secondary | ICD-10-CM | POA: Diagnosis present

## 2020-01-12 DIAGNOSIS — F329 Major depressive disorder, single episode, unspecified: Secondary | ICD-10-CM | POA: Diagnosis present

## 2020-01-12 DIAGNOSIS — D72829 Elevated white blood cell count, unspecified: Secondary | ICD-10-CM | POA: Diagnosis not present

## 2020-01-12 DIAGNOSIS — M797 Fibromyalgia: Secondary | ICD-10-CM | POA: Diagnosis present

## 2020-01-12 DIAGNOSIS — R1010 Upper abdominal pain, unspecified: Secondary | ICD-10-CM | POA: Diagnosis not present

## 2020-01-12 DIAGNOSIS — R1013 Epigastric pain: Secondary | ICD-10-CM | POA: Diagnosis present

## 2020-01-12 DIAGNOSIS — G8929 Other chronic pain: Secondary | ICD-10-CM | POA: Diagnosis present

## 2020-01-12 DIAGNOSIS — Z6835 Body mass index (BMI) 35.0-35.9, adult: Secondary | ICD-10-CM | POA: Diagnosis not present

## 2020-01-12 DIAGNOSIS — R188 Other ascites: Secondary | ICD-10-CM | POA: Diagnosis present

## 2020-01-12 DIAGNOSIS — Z91013 Allergy to seafood: Secondary | ICD-10-CM | POA: Diagnosis not present

## 2020-01-12 DIAGNOSIS — Z886 Allergy status to analgesic agent status: Secondary | ICD-10-CM | POA: Diagnosis not present

## 2020-01-12 DIAGNOSIS — A044 Other intestinal Escherichia coli infections: Secondary | ICD-10-CM | POA: Diagnosis present

## 2020-01-12 DIAGNOSIS — A040 Enteropathogenic Escherichia coli infection: Secondary | ICD-10-CM | POA: Diagnosis not present

## 2020-01-12 DIAGNOSIS — M549 Dorsalgia, unspecified: Secondary | ICD-10-CM | POA: Diagnosis present

## 2020-01-12 LAB — IRON AND TIBC
Iron: 80 ug/dL (ref 28–170)
Saturation Ratios: 22 % (ref 10.4–31.8)
TIBC: 364 ug/dL (ref 250–450)
UIBC: 284 ug/dL

## 2020-01-12 LAB — COMPREHENSIVE METABOLIC PANEL
ALT: 13 U/L (ref 0–44)
AST: 20 U/L (ref 15–41)
Albumin: 4.1 g/dL (ref 3.5–5.0)
Alkaline Phosphatase: 90 U/L (ref 38–126)
Anion gap: 9 (ref 5–15)
BUN: 7 mg/dL (ref 6–20)
CO2: 25 mmol/L (ref 22–32)
Calcium: 8.8 mg/dL — ABNORMAL LOW (ref 8.9–10.3)
Chloride: 105 mmol/L (ref 98–111)
Creatinine, Ser: 0.69 mg/dL (ref 0.44–1.00)
GFR calc Af Amer: >60 mL/min (ref >60)
GFR calc non Af Amer: >60 mL/min (ref >60)
Glucose, Bld: 97 mg/dL (ref 70–99)
Potassium: 3.9 mmol/L (ref 3.5–5.1)
Sodium: 139 mmol/L (ref 135–145)
Total Bilirubin: 0.9 mg/dL (ref 0.3–1.2)
Total Protein: 7.2 g/dL (ref 6.5–8.1)

## 2020-01-12 LAB — CBC
HCT: 40.1 % (ref 36.0–46.0)
Hemoglobin: 12.7 g/dL (ref 12.0–15.0)
MCH: 28 pg (ref 26.0–34.0)
MCHC: 31.7 g/dL (ref 30.0–36.0)
MCV: 88.5 fL (ref 80.0–100.0)
Platelets: 363 10*3/uL (ref 150–400)
RBC: 4.53 MIL/uL (ref 3.87–5.11)
RDW: 13.2 % (ref 11.5–15.5)
WBC: 13.4 10*3/uL — ABNORMAL HIGH (ref 4.0–10.5)
nRBC: 0 % (ref 0.0–0.2)

## 2020-01-12 LAB — HIV ANTIBODY (ROUTINE TESTING W REFLEX): HIV Screen 4th Generation wRfx: NONREACTIVE

## 2020-01-12 LAB — FERRITIN: Ferritin: 58 ng/mL (ref 11–307)

## 2020-01-12 MED ORDER — BUPROPION HCL ER (XL) 150 MG PO TB24
150.0000 mg | ORAL_TABLET | Freq: Every day | ORAL | Status: DC
  Administered 2020-01-13: 150 mg via ORAL
  Filled 2020-01-12: qty 1

## 2020-01-12 MED ORDER — ALBUTEROL SULFATE ER 4 MG PO TB12
8.0000 mg | ORAL_TABLET | Freq: Two times a day (BID) | ORAL | Status: DC
  Filled 2020-01-12: qty 2

## 2020-01-12 MED ORDER — METOCLOPRAMIDE HCL 5 MG/ML IJ SOLN
10.0000 mg | Freq: Four times a day (QID) | INTRAMUSCULAR | Status: DC | PRN
  Administered 2020-01-13: 10 mg via INTRAVENOUS
  Filled 2020-01-12: qty 2

## 2020-01-12 MED ORDER — PANTOPRAZOLE SODIUM 40 MG IV SOLR
40.0000 mg | Freq: Two times a day (BID) | INTRAVENOUS | Status: DC
  Administered 2020-01-12 – 2020-01-13 (×3): 40 mg via INTRAVENOUS
  Filled 2020-01-12 (×3): qty 40

## 2020-01-12 MED ORDER — AMITRIPTYLINE HCL 25 MG PO TABS
25.0000 mg | ORAL_TABLET | Freq: Every day | ORAL | Status: DC
  Administered 2020-01-12: 25 mg via ORAL
  Filled 2020-01-12: qty 1

## 2020-01-12 MED ORDER — TOPIRAMATE 25 MG PO TABS: 25.0000 mg | ORAL_TABLET | Freq: Two times a day (BID) | ORAL | Status: DC

## 2020-01-12 MED ORDER — MORPHINE SULFATE 15 MG PO TABS
15.0000 mg | ORAL_TABLET | Freq: Four times a day (QID) | ORAL | Status: DC | PRN
  Administered 2020-01-13: 15 mg via ORAL
  Filled 2020-01-12: qty 1

## 2020-01-12 MED ORDER — PROMETHAZINE HCL 25 MG/ML IJ SOLN
12.5000 mg | Freq: Four times a day (QID) | INTRAMUSCULAR | Status: DC | PRN
  Administered 2020-01-12 (×2): 12.5 mg via INTRAVENOUS
  Filled 2020-01-12 (×2): qty 1

## 2020-01-12 MED ORDER — SODIUM CHLORIDE 0.9 % IV SOLN: INTRAVENOUS | Status: DC

## 2020-01-12 MED ORDER — TOPIRAMATE 25 MG PO TABS
25.0000 mg | ORAL_TABLET | Freq: Every day | ORAL | Status: DC
  Administered 2020-01-12: 25 mg via ORAL
  Filled 2020-01-12 (×2): qty 1

## 2020-01-12 MED ORDER — IBUPROFEN 400 MG PO TABS
400.0000 mg | ORAL_TABLET | Freq: Once | ORAL | Status: AC
  Administered 2020-01-12: 400 mg via ORAL
  Filled 2020-01-12: qty 1

## 2020-01-12 MED ORDER — ROPINIROLE HCL 1 MG PO TABS
1.0000 mg | ORAL_TABLET | Freq: Once | ORAL | Status: AC
  Administered 2020-01-12: 1 mg via ORAL
  Filled 2020-01-12: qty 1

## 2020-01-12 MED ORDER — TOPIRAMATE 25 MG PO TABS
50.0000 mg | ORAL_TABLET | Freq: Every day | ORAL | Status: DC
  Administered 2020-01-13: 50 mg via ORAL
  Filled 2020-01-12: qty 2

## 2020-01-12 MED ORDER — KETOROLAC TROMETHAMINE 30 MG/ML IJ SOLN
30.0000 mg | Freq: Four times a day (QID) | INTRAMUSCULAR | Status: DC | PRN
  Administered 2020-01-12 – 2020-01-13 (×2): 30 mg via INTRAVENOUS
  Filled 2020-01-12 (×2): qty 1

## 2020-01-12 MED ORDER — MAGNESIUM CITRATE PO SOLN
1.0000 | Freq: Once | ORAL | Status: AC
  Administered 2020-01-12: 1 via ORAL
  Filled 2020-01-12: qty 296

## 2020-01-12 MED ORDER — ALBUTEROL SULFATE 2 MG PO TABS
8.0000 mg | ORAL_TABLET | Freq: Two times a day (BID) | ORAL | Status: DC
  Administered 2020-01-12 – 2020-01-13 (×2): 8 mg via ORAL
  Filled 2020-01-12 (×3): qty 4

## 2020-01-12 MED ORDER — PEG 3350-KCL-NA BICARB-NACL 420 G PO SOLR
4000.0000 mL | Freq: Once | ORAL | Status: AC
  Administered 2020-01-12: 4000 mL via ORAL
  Filled 2020-01-12: qty 4000

## 2020-01-12 MED ORDER — ROPINIROLE HCL 1 MG PO TABS
1.0000 mg | ORAL_TABLET | Freq: Every day | ORAL | Status: DC
  Administered 2020-01-12: 1 mg via ORAL
  Filled 2020-01-12: qty 1

## 2020-01-12 NOTE — Consult Note (Signed)
Cephas Darby, MD [address]  [place]  [place], [place] [address]  Main: [phone]  Fax: [phone] Pager: [phone]   Consultation  Referring Provider:     No ref. provider found Primary Care Physician:  Leonard Downing, MD Primary Gastroenterologist: Althia Forts         Reason for Consultation:     Enteritis  Date of Admission:  01/11/2020 Date of Consultation:  01/12/2020         HPI:   [name] is a 59 y.o. female with past medical history as listed below, presented to ER yesterday with severe lower abdominal pain, loose stool as well as nausea and vomiting.  She underwent CT abdomen and pelvis which revealed several loops of distal ileum with bowel wall thickening and adjacent mesenteric fat stranding with no evidence of dilation or stricture.  No evidence of pneumoperitoneum.  She did have mild leukocytosis, WBC count 15 K on presentation.  Normal lactate.  CMP normal, no anemia.  She was started on IV fluids, clear liquids as well as antibiotics and GI is consulted for further evaluation  Patient reports that her pain is actually above the umbilicus and epigastric region associated with nausea and vomiting.  Her pain is persistent.  She is on chronic morphine at home for chronic back pain.  She reports having bowel movement every other day at baseline, denies constipation.  She reports having diarrhea yesterday which was nonbloody.  She did not have any bowel movements today.  She denies consuming contaminated food or any outside food  NSAIDs: Ibuprofen and celecoxib as needed  Patient does not smoke or drink alcohol  Antiplts/Anticoagulants/Anti thrombotics: None  GI Procedures: None She denies family history of GI malignancy or inflammatory bowel disease  Past Medical History:  Diagnosis Date  . Anemia   . Anxiety   . Arthritis    multiple areas  . Bone marrow disease    DX many years ago - pt unsure of name and unable to find info  on type of disease  . Complication of anesthesia 2011   slow to awaken after hysterectomy  . Depression   . Dyspnea    if does not take albuterol she states she can not breath   . Family history of adverse reaction to anesthesia    pts mother has nausea and vomiting; pts sons become aggressive and have hallicunations  . Fibromyalgia   . H/O iron deficiency anemia   . Headache    MIGRAINES  . History of blood transfusion    day pt was born  . History of bronchitis   . Hx of pneumothorax    X2 IN PAST  . Kidney disease    only has one functioning kidney - no problems  x 23 yrs  . Liver failure, acute 5 years ago   cause unknown, after hysterectomy  . Neuropathy   . Numbness    MULTIPLE AREAS - "from all the surgeries I've had"  . PONV (postoperative nausea and vomiting)     Past Surgical History:  Procedure Laterality Date  . ABDOMINAL HYSTERECTOMY    . BACK SURGERY     multiple  . CERVICAL  SPINE SURG     SEVERAL TIMES  . CERVICAL DISCECTOMY     with fusion  . DILATION AND CURETTAGE OF UTERUS    . FOOT SURGERY  1989  . HERNIA REPAIR  2011  . KNEE ARTHROSCOPY WITH DRILLING/MICROFRACTURE Right  03/13/2014   Procedure: RIGHT MICROFRACTURE TECHNIQUE;  Surgeon: Tobi Bastos, MD;  Location: [facility];  Service: Orthopedics;  Laterality: Right;  . KNEE ARTHROSCOPY WITH MEDIAL MENISECTOMY Right 03/13/2014   Procedure: RIGHT KNEE ARTHROSCOPY WITH MEDIAL FEMORAL CONDYLE REPAIR;  Surgeon: Tobi Bastos, MD;  Location: [facility];  Service: Orthopedics;  Laterality: Right;  . KNEE ARTHROSCOPY WITH MEDIAL PATELLAR FEMORAL LIGAMENT RECONSTRUCTION Right 03/13/2014   Procedure: ABRASION CHONDROPLASTY OF MEDIAL FEMORAL;  Surgeon: Tobi Bastos, MD;  Location: [facility];  Service: Orthopedics;  Laterality: Right;  . left knee arthroscopy    . polyp removal  2010   uterine  . right leg surgery      secondary to trauma  . SHOULDER SURGERY     Bilaterally X9 SURGERIES  . SYNOVECTOMY  Right 03/13/2014   Procedure: SYNOVECTOMY OF THE SUPRAPATELLA POUCH;  Surgeon: Tobi Bastos, MD;  Location: [facility];  Service: Orthopedics;  Laterality: Right;  . TONSILLECTOMY    . TOTAL KNEE ARTHROPLASTY Right 07/30/2014   Procedure: RIGHT TOTAL KNEE ARTHROPLASTY;  Surgeon: Latanya Maudlin, MD;  Location: [facility];  Service: Orthopedics;  Laterality: Right;  . TOTAL KNEE REVISION WITH SCAR DEBRIDEMENT/PATELLA REVISION WITH POLY EXCHANGE Right 04/05/2016   Procedure: OPEN SCAR DEBRIDEMENT WITH POLY EXCHANGE reviosion of patella;  Surgeon: Paralee Cancel, MD;  Location: [facility];  Service: Orthopedics;  Laterality: Right;    Prior to Admission medications   Medication Sig Start Date End Date Taking? Authorizing Provider  albuterol (VOSPIRE ER) 4 MG 12 hr tablet Take 8 mg by mouth 2 (two) times daily.   Yes [provider]  amitriptyline (ELAVIL) 25 MG tablet Take 25 mg by mouth at bedtime.  01/02/20  Yes [provider]  buPROPion (WELLBUTRIN XL) 150 MG 24 hr tablet every morning. TAKE 1 TO 3 TABLETS BY MOUTH EVERY DAY   Yes [provider]  celecoxib (CELEBREX) 200 MG capsule Take 1 capsule (200 mg total) by mouth every 12 (twelve) hours. Take along with Pepcid to help avoid GI upset. Patient taking differently: Take 200 mg by mouth daily. Take along with Pepcid to help avoid GI upset. 04/06/16  Yes Babish, Rodman Key, PA-C  EPIPEN 2-PAK 0.3 MG/0.3ML SOAJ injection Inject 0.3 mg as directed as needed (allergic reaction).  02/18/16  Yes [provider]  ibuprofen (ADVIL) 200 MG tablet Take 200 mg by mouth every 6 (six) hours as needed.   Yes [provider]  morphine (MSIR) 15 MG tablet Take 15 mg by mouth every 6 (six) hours as needed for severe pain.    Yes [provider]  ondansetron (ZOFRAN) 8 MG tablet Take 8 mg by mouth 3 (three) times daily as needed for nausea or vomiting.  02/18/16  Yes [provider]  rOPINIRole (REQUIP) 2 MG tablet  Take 1 mg by mouth at bedtime.    Yes [provider]  topiramate (TOPAMAX) 25 MG tablet Take 25-50 mg by mouth 2 (two) times daily. Take 46m in the morning and 260m at night 01/02/16  Yes [provider]    Current Facility-Administered Medications:  .  0.9 %  sodium chloride infusion, , Intravenous, Continuous, Estefanie Cornforth, RoTally DueMD .  acetaminophen (TYLENOL) tablet 650 mg, 650 mg, Oral, Q6H PRN **OR** acetaminophen (TYLENOL) suppository 650 mg, 650 mg, Rectal, Q6H PRN, GrNicole Kindred, DO .  capsaicin (ZOSTRIX) 0.025 % cream, , Topical, BID, MoSharion SettlerNP, Given at 01/12/20 [phone]  cefTRIAXone (  ROCEPHIN) 2 g in sodium chloride 0.9 % 100 mL IVPB, 2 g, Intravenous, Q24H, Ezekiel Slocumb, DO, Stopped at 01/11/20 2058 .  enoxaparin (LOVENOX) injection 40 mg, 40 mg, Subcutaneous, Q24H, Nicole Kindred A, DO, 40 mg at 01/11/20 2212 .  ketorolac (TORADOL) 30 MG/ML injection 30 mg, 30 mg, Intravenous, Q6H PRN, Nicole Kindred A, DO, 30 mg at 01/12/20 1057 .  magnesium citrate solution 1 Bottle, 1 Bottle, Oral, Once, Avyukth Bontempo, Tally Due, MD .  metroNIDAZOLE (FLAGYL) IVPB 500 mg, 500 mg, Intravenous, Q8H, Nicole Kindred A, DO, Stopped at 01/12/20 1443 .  ondansetron (ZOFRAN) tablet 4 mg, 4 mg, Oral, Q6H PRN **OR** ondansetron (ZOFRAN) injection 4 mg, 4 mg, Intravenous, Q6H PRN, Nicole Kindred A, DO, 4 mg at 01/12/20 0908 .  oxyCODONE (Oxy IR/ROXICODONE) immediate release tablet 5 mg, 5 mg, Oral, Q4H PRN, Nicole Kindred A, DO, 5 mg at 01/11/20 2328 .  pantoprazole (PROTONIX) injection 40 mg, 40 mg, Intravenous, Q12H, Lurene Robley, Tally Due, MD .  polyethylene glycol-electrolytes (NuLYTELY) solution 4,000 mL, 4,000 mL, Oral, Once, Netta Fodge, Tally Due, MD .  promethazine (PHENERGAN) injection 12.5 mg, 12.5 mg, Intravenous, Q6H PRN, Nicole Kindred A, DO, 12.5 mg at 01/12/20 1211  Current Outpatient Medications:  .  albuterol (VOSPIRE ER) 4 MG 12 hr tablet, Take 8 mg by mouth  2 (two) times daily., Disp: , Rfl:  .  amitriptyline (ELAVIL) 25 MG tablet, Take 25 mg by mouth at bedtime. , Disp: , Rfl:  .  buPROPion (WELLBUTRIN XL) 150 MG 24 hr tablet, every morning. TAKE 1 TO 3 TABLETS BY MOUTH EVERY DAY, Disp: , Rfl:  .  celecoxib (CELEBREX) 200 MG capsule, Take 1 capsule (200 mg total) by mouth every 12 (twelve) hours. Take along with Pepcid to help avoid GI upset. (Patient taking differently: Take 200 mg by mouth daily. Take along with Pepcid to help avoid GI upset.), Disp: 60 capsule, Rfl: 0 .  EPIPEN 2-PAK 0.3 MG/0.3ML SOAJ injection, Inject 0.3 mg as directed as needed (allergic reaction). , Disp: , Rfl:  .  ibuprofen (ADVIL) 200 MG tablet, Take 200 mg by mouth every 6 (six) hours as needed., Disp: , Rfl:  .  morphine (MSIR) 15 MG tablet, Take 15 mg by mouth every 6 (six) hours as needed for severe pain. , Disp: , Rfl:  .  ondansetron (ZOFRAN) 8 MG tablet, Take 8 mg by mouth 3 (three) times daily as needed for nausea or vomiting. , Disp: , Rfl:  .  rOPINIRole (REQUIP) 2 MG tablet, Take 1 mg by mouth at bedtime. , Disp: , Rfl:  .  topiramate (TOPAMAX) 25 MG tablet, Take 25-50 mg by mouth 2 (two) times daily. Take 33m in the morning and 234m at night, Disp: , Rfl:   Family History  Problem Relation Age of Onset  . Heart disease Mother   . Heart disease Father   . Colon cancer Father   . Heart disease Maternal Grandfather   . Heart disease Maternal Grandmother   . Heart disease Paternal Grandfather   . Heart disease Paternal Grandmother      Social History   Tobacco Use  . Smoking status: Never Smoker  . Smokeless tobacco: Never Used  Vaping Use  . Vaping Use: Never used  Substance Use Topics  . Alcohol use: No  . Drug use: No    Allergies as of 01/11/2020 - Review Complete 01/11/2020  Allergen Reaction Noted  . Fish allergy Anaphylaxis 02/14/2013  .  Other Shortness Of Breath 03/24/2016  . Nortriptyline Other (See Comments) 08/13/2010  .  Cyclobenzaprine  04/27/2018  . Tylenol [acetaminophen] Other (See Comments) 07/25/2014  . Bee venom Hives 02/14/2013  . Flexeril [cyclobenzaprine hcl] Other (See Comments) 08/13/2010    Review of Systems:    All systems reviewed and negative except where noted in HPI.   Physical Exam:  Vital signs in last 24 hours: Temp:  [98.4 F (36.9 C)] 98.4 F (36.9 C) (08/27 2000) Pulse Rate:  [62-77] 62 (08/28 0908) Resp:  [16-20] 18 (08/28 0908) BP: (120-139)/(65-89) 130/65 (08/28 0908) SpO2:  [96 %-100 %] 96 % (08/28 0908)   General:   Pleasant, cooperative in NAD Head:  Normocephalic and atraumatic. Eyes:   No icterus.   Conjunctiva pink. PERRLA. Ears:  Normal auditory acuity. Neck:  Supple; no masses or thyroidomegaly Lungs: Respirations even and unlabored. Lungs clear to auscultation bilaterally.   No wheezes, crackles, or rhonchi.  Heart:  Regular rate and rhythm;  Without murmur, clicks, rubs or gallops Abdomen:  Soft, nondistended, tender epigastric area. Normal bowel sounds. No appreciable masses or hepatomegaly.  No rebound or guarding.  Rectal:  Not performed. Msk:  Symmetrical without gross deformities.  Strength normal Extremities:  Without edema, cyanosis or clubbing. Neurologic:  Alert and oriented x3;  grossly normal neurologically. Skin:  Intact without significant lesions or rashes. Psych:  Alert and cooperative. Normal affect.  LAB RESULTS: CBC Latest Ref Rng & Units 01/12/2020 01/11/2020 06/07/2018  WBC 4.0 - 10.5 K/uL 13.4(H) 15.2(H) 6.3  Hemoglobin 12.0 - 15.0 g/dL 12.7 13.5 12.2  Hematocrit 36 - 46 % 40.1 41.8 39.5  Platelets 150 - 400 K/uL 363 362 286    BMET BMP Latest Ref Rng & Units 01/12/2020 01/11/2020 06/07/2018  Glucose 70 - 99 mg/dL 97 97 114(H)  BUN 6 - 20 mg/dL '7 9 14  ' Creatinine 0.44 - 1.00 mg/dL 0.69 0.68 0.83  Sodium 135 - 145 mmol/L 139 140 142  Potassium 3.5 - 5.1 mmol/L 3.9 4.4 4.4  Chloride 98 - 111 mmol/L 105 105 107  CO2 22 - 32 mmol/L '25  24 29  ' Calcium 8.9 - 10.3 mg/dL 8.8(L) 9.1 9.6    LFT Hepatic Function Latest Ref Rng & Units 01/12/2020 01/11/2020 06/07/2018  Total Protein 6.5 - 8.1 g/dL 7.2 7.8 6.6  Albumin 3.5 - 5.0 g/dL 4.1 4.2 4.3  AST 15 - 41 U/L 20 18 13(L)  ALT 0 - 44 U/L '13 13 9  ' Alk Phosphatase 38 - 126 U/L 90 99 99  Total Bilirubin 0.3 - 1.2 mg/dL 0.9 0.8 0.4  Bilirubin, Direct 0.0 - 0.3 mg/dL - - -     STUDIES: CT ABDOMEN PELVIS W CONTRAST  Result Date: 01/11/2020 CLINICAL DATA:  Pt here with c/o lower abd cramping and pain that began this am, denies any pain with urination, however, did notice some blood in her urine at few days ago. H EXAM: CT ABDOMEN AND PELVIS WITH CONTRAST TECHNIQUE: Multidetector CT imaging of the abdomen and pelvis was performed using the standard protocol following bolus administration of intravenous contrast. CONTRAST:  143m OMNIPAQUE IOHEXOL 300 MG/ML  SOLN COMPARISON:  CT abdomen pelvis 02/21/2017 FINDINGS: Lower chest: No acute abnormality. Hepatobiliary: No focal liver abnormality is seen. No gallstones, gallbladder wall thickening, or biliary dilatation. Pancreas: Unremarkable. No pancreatic ductal dilatation or surrounding inflammatory changes. Spleen: Normal in size without focal abnormality. Adrenals/Urinary Tract: Adrenal glands are unremarkable. Kidneys are normal, without renal calculi, focal  lesion, or hydronephrosis. Bladder is unremarkable. Stomach/Bowel: Stomach is within normal limits. Appendix is not visualized. There are several loops of distal ileum with bowel wall thickening and adjacent mesenteric fat stranding. No dilation to suggest obstruction. No free air. No pneumatosis. The remainder of the bowel is normal in appearance. Vascular/Lymphatic: No significant vascular findings are present. No enlarged abdominal or pelvic lymph nodes. Reproductive: Status post hysterectomy. No adnexal masses. Other: Small amount of abdominopelvic ascites, likely reactive.  Musculoskeletal: Lumbar spine posterior fusion hardware. IMPRESSION: There are several loops of distal ileum with bowel wall thickening and adjacent mesenteric fat stranding, consistent with enteritis, which may be infectious or inflammatory in etiology. Small amount of abdominopelvic ascites, likely reactive. Electronically Signed   By: Audie Pinto M.D.   On: 01/11/2020 15:21   US Abdomen Limited RUQ  Result Date: 01/11/2020 CLINICAL DATA:  Abdominal pain EXAM: ULTRASOUND ABDOMEN LIMITED RIGHT UPPER QUADRANT COMPARISON:  CT from earlier in the same day. FINDINGS: Gallbladder: No gallstones or wall thickening visualized. No sonographic Murphy sign noted by sonographer. Common bile duct: Diameter: 4.5 mm. Liver: No focal lesion identified. Within normal limits in parenchymal echogenicity. Portal vein is patent on color Doppler imaging with normal direction of blood flow towards the liver. Other: None. IMPRESSION: No acute abnormality noted. Electronically Signed   By: Inez Catalina M.D.   On: 01/11/2020 19:52      Impression / Plan:   [name] is a 59 y.o. female with history of anxiety, depression, fibromyalgia, chronic migraine, chronic opioid use admitted with acute central and upper abdominal pain associated with nausea, vomiting and loose stools.  Mild leukocytosis as well as CT revealed enteritis in the distal ileum  Enteritis: Improving leukocytosis, normal lactic acid levels Recommend stool studies to rule out infection if patient is still having diarrhea Okay with empiric antibiotics Recommend colonoscopy with TI evaluation, will plan for tomorrow if she finishes bowel prep Continue clear liquid diet  Upper abdominal pain Peptic ulcer disease or narcotic bowel Start Protonix 40 mg p.o. twice daily Recommend EGD along with colonoscopy tomorrow  Thank you for involving me in the care of this patient.  GI will follow along with you    LOS: 0 days   Sherri Sear, MD   01/12/2020, 3:27 PM   Note: This dictation was prepared with Dragon dictation along with smaller phrase technology. Any transcriptional errors that result from this process are unintentional.

## 2020-01-12 NOTE — Hospital Course (Signed)
[name] is a 59 y.o. female with medical history significant of migraines, iron deficiency, depression/anxiety, chronic back pain s/p multiple surgeries who presented to the ED with severe abdominal pain, nausea with dry heaving and loose stool.  The pain woke her up from sleep and had remained constant, sharp, located mid-abdomen above umbilicus and radiates upward and bilaterally.  ED Course: vitals within normal aside from BP 150/88.  CBC with leukocytosis of 15.2k, otherwise normal. CMP and lipase were normal.  Lactic acid 1.1.  UA negative.     CT abdomen/pelvis impression: There are several loops of distal ileum with bowel wall thickening and adjacent mesenteric fat stranding, consistent with enteritis, which may be infectious or inflammatory in etiology. Small amount of abdominopelvic ascites, likely reactive.   Treated with morphine and dilaudid in the ED with no relief of her pain.  Admitted to hospitalist service with GI consulted for further evaluation and management.

## 2020-01-12 NOTE — Progress Notes (Addendum)
PROGRESS NOTE    Tara Fullingatricia R Deats   BWG:[id number]  DOB: [birth year]  PCP: Kaleen MaskElkins, Wilson Oliver, MD    DOA: 01/11/2020 LOS: 0   Brief Narrative   [name] is a 59 y.o. female with medical history significant of migraines, iron deficiency, depression/anxiety, chronic back pain s/p multiple surgeries who presented to the ED with severe abdominal pain, nausea with dry heaving and loose stool.  The pain woke her up from sleep and had remained constant, sharp, located mid-abdomen above umbilicus and radiates upward and bilaterally.  ED Course: vitals within normal aside from BP 150/88.  CBC with leukocytosis of 15.2k, otherwise normal. CMP and lipase were normal.  Lactic acid 1.1.  UA negative.     CT abdomen/pelvis impression: There are several loops of distal ileum with bowel wall thickening and adjacent mesenteric fat stranding, consistent with enteritis, which may be infectious or inflammatory in etiology. Small amount of abdominopelvic ascites, likely reactive.   Treated with morphine and dilaudid in the ED with no relief of her pain.  Admitted to hospitalist service with GI consulted for further evaluation and management.      Assessment & Plan   Principal Problem:   Intractable abdominal pain Active Problems:   Nausea & vomiting   Leukocytosis   Obesity   History of migraine   Chronic back pain   Hx of iron deficiency anemia   Depression   Enteritis   Intractable abdominal pain - present on admission.  Sudden onset, woke her from sleep, constant since onset, sharp, almost no relief from IV pain meds given in the ED.  CT findings as above, consistent with enteritis.  Appendix was not visualized on CT (no hx of appendectomy).  Right upper quadrant ultrasound was normal. Symptoms essentially unchanged since admission, leukocytosis is improving. --GI consulted --EGD and colonoscopy with examination of terminal ileum planned for tomorrow  --Continue empiric Rocephin  and Flagyl for now --Stool studies pending (no BM since admission) --IV Toradol PRN  --Pt declined further IV opioids as of this morning (not helpful and worsen headache), orders d/c'd.   --Would use IV Dilaudid or Fentanyl if needed --IV Antiemetics --Clear liquid diet for now, n.p.o. after midnight --Started on oral Protonix 40 mg BID  Nausea & vomiting - IV Zofran or Phenergan PRN  Leukocytosis - WBC 15.2k on admission.  Possibly reactive, but cannot otherwise rule out infection yet.  Antibiotics as above for now.  CBC's daily.  History of migraine -continue Topamax  Chronic back pain - seems stable.  Resume home oral morphine PRN.  Otherwise pain meds as above.  Hold NSAIDs.  Hx of iron deficiency anemia - not anemic at this time.  Has followed with heme/onc for iron infusions, last one was about 2 years ago.  Iron studies checked and within normal limits.  Restless leg syndrome -resume home Requip  Depression / Anxiety -continue home Elavil, Wellbutrin  Obesity - Body mass index is 35.7 kg/m.  Has been losing weight intentionally.  Complicates overall care and prognosis.   Patient BMI: Body mass index is 35.7 kg/m.   DVT prophylaxis: enoxaparin (LOVENOX) injection 40 mg Start: 01/11/20 2200   Diet:  Diet Orders (From admission, onward)    Start     Ordered   01/13/20 0500  Diet NPO time specified Except for: [employer]ce Chips, Sips with Meds  Diet effective 0500       Question Answer Comment  Except for Ice Chips   Except  for Sips with Meds      01/12/20 1416   01/12/20 1417  Diet clear liquid Room service appropriate? Yes; Fluid consistency: Thin  Diet effective now       Question Answer Comment  Room service appropriate? Yes   Fluid consistency: Thin      01/12/20 1416            Code Status: Full Code    Subjective 01/12/20    Patient seen at bedside this morning in the ED on hold for bed.  Says she had a terrible night.  Had nausea vomiting around 3  AM or so.  She reported no relief and worsened headache with IV Dilaudid.  She was given Advil for headache several hours ago and reports relief.  Abdominal pain is unchanged.  Denies any fevers or chills.  No BMs or flatulence since admission abdomen is not distended on exam or subjectively.   Disposition Plan & Communication   Status is: Inpatient  Remains inpatient appropriate because:Ongoing diagnostic testing needed not appropriate for outpatient work up   Dispo: The patient is from: Home              Anticipated d/c is to: Home              Anticipated d/c date is: 2 days              Patient currently is not medically stable to d/c.   Family Communication: None at bedside, will attempt to call husband   Consults, Procedures, Significant Events   Consultants:   Gastroenterology  Procedures:   None  Antimicrobials:   Rocephin and Flagyl 8/27 >>   Objective   Vitals:   01/12/20 0245 01/12/20 0908 01/12/20 1600 01/12/20 1619  BP: 129/78 130/65 (!) 140/94 137/81  Pulse: 71 62 72 62  Resp: 20 18 18 16   Temp:    98.5 F (36.9 C)  TempSrc:    Oral  SpO2: 98% 96% 98% 98%  Weight:      Height:        Intake/Output Summary (Last 24 hours) at 01/12/2020 1652 Last data filed at 01/12/2020 1600 Gross per 24 hour  Intake 392.05 ml  Output --  Net 392.05 ml   Filed Weights   01/11/20 1409  Weight: 94.3 kg    Physical Exam:  General exam: awake, alert, no acute distress Respiratory system: CTAB, no wheezes, rales or rhonchi, normal respiratory effort. Cardiovascular system: normal S1/S2, RRR, no pedal edema.   Gastrointestinal system: soft, tender on palpation, not distended, minimal bowel sounds Central nervous system: A&O x4. no gross focal neurologic deficits, normal speech Extremities: moves all, no edema, normal tone Psychiatry: normal mood, congruent affect, judgement and insight appear normal  Labs   Data Reviewed: I have personally reviewed  following labs and imaging studies  CBC: Recent Labs  Lab 01/11/20 1411 01/12/20 0337  WBC 15.2* 13.4*  HGB 13.5 12.7  HCT 41.8 40.1  MCV 87.4 88.5  PLT 362 363   Basic Metabolic Panel: Recent Labs  Lab 01/11/20 1411 01/12/20 0337  NA 140 139  K 4.4 3.9  CL 105 105  CO2 24 25  GLUCOSE 97 97  BUN 9 7  CREATININE 0.68 0.69  CALCIUM 9.1 8.8*   GFR: Estimated Creatinine Clearance: 86.4 mL/min (by C-G formula based on SCr of 0.69 mg/dL). Liver Function Tests: Recent Labs  Lab 01/11/20 1411 01/12/20 0337  AST 18 20  ALT 13  13  ALKPHOS 99 90  BILITOT 0.8 0.9  PROT 7.8 7.2  ALBUMIN 4.2 4.1   Recent Labs  Lab 01/11/20 1411  LIPASE 22   No results for input(s): AMMONIA in the last 168 hours. Coagulation Profile: No results for input(s): INR, PROTIME in the last 168 hours. Cardiac Enzymes: No results for input(s): CKTOTAL, CKMB, CKMBINDEX, TROPONINI in the last 168 hours. BNP (last 3 results) No results for input(s): PROBNP in the last 8760 hours. HbA1C: No results for input(s): HGBA1C in the last 72 hours. CBG: No results for input(s): GLUCAP in the last 168 hours. Lipid Profile: No results for input(s): CHOL, HDL, LDLCALC, TRIG, CHOLHDL, LDLDIRECT in the last 72 hours. Thyroid Function Tests: No results for input(s): TSH, T4TOTAL, FREET4, T3FREE, THYROIDAB in the last 72 hours. Anemia Panel: Recent Labs    01/12/20 0337  FERRITIN 58  TIBC 364  IRON 80   Sepsis Labs: Recent Labs  Lab 01/11/20 1608  LATICACIDVEN 1.1    Recent Results (from the past 240 hour(s))  SARS Coronavirus 2 by RT PCR (hospital order, performed in [facility] hospital lab) Nasopharyngeal Nasopharyngeal Swab     Status: None   Collection Time: 01/11/20  4:08 PM   Specimen: Nasopharyngeal Swab  Result Value Ref Range Status   SARS Coronavirus 2 NEGATIVE NEGATIVE Final    Comment: (NOTE) SARS-CoV-2 target nucleic acids are NOT DETECTED.  The SARS-CoV-2 RNA is generally  detectable in upper and lower respiratory specimens during the acute phase of infection. The lowest concentration of SARS-CoV-2 viral copies this assay can detect is 250 copies / mL. A negative result does not preclude SARS-CoV-2 infection and should not be used as the sole basis for treatment or other patient management decisions.  A negative result may occur with improper specimen collection / handling, submission of specimen other than nasopharyngeal swab, presence of viral mutation(s) within the areas targeted by this assay, and inadequate number of viral copies (<250 copies / mL). A negative result must be combined with clinical observations, patient history, and epidemiological information.  Fact Sheet for Patients:   BoilerBrush.com.cy  Fact Sheet for Healthcare Providers: https://pope.com/  This test is not yet approved or  cleared by the [place] FDA and has been authorized for detection and/or diagnosis of SARS-CoV-2 by FDA under an Emergency Use Authorization (EUA).  This EUA will remain in effect (meaning this test can be used) for the duration of the COVID-19 declaration under Section 564(b)(1) of the Act, 21 U.S.C. section 360bbb-3(b)(1), unless the authorization is terminated or revoked sooner.  Performed at [facility], [address] [place], [place] [address]       Imaging Studies   CT ABDOMEN PELVIS W CONTRAST  Result Date: 01/11/2020 CLINICAL DATA:  Pt here with c/o lower abd cramping and pain that began this am, denies any pain with urination, however, did notice some blood in her urine at few days ago. H EXAM: CT ABDOMEN AND PELVIS WITH CONTRAST TECHNIQUE: Multidetector CT imaging of the abdomen and pelvis was performed using the standard protocol following bolus administration of intravenous contrast. CONTRAST:  OMNIPAQUE IOHEXOL 300 MG/ML  SOLN COMPARISON:  CT abdomen pelvis 02/21/2017  FINDINGS: Lower chest: No acute abnormality. Hepatobiliary: No focal liver abnormality is seen. No gallstones, gallbladder wall thickening, or biliary dilatation. Pancreas: Unremarkable. No pancreatic ductal dilatation or surrounding inflammatory changes. Spleen: Normal in size without focal abnormality. Adrenals/Urinary Tract: Adrenal glands are unremarkable. Kidneys are normal, without  renal calculi, focal lesion, or hydronephrosis. Bladder is unremarkable. Stomach/Bowel: Stomach is within normal limits. Appendix is not visualized. There are several loops of distal ileum with bowel wall thickening and adjacent mesenteric fat stranding. No dilation to suggest obstruction. No free air. No pneumatosis. The remainder of the bowel is normal in appearance. Vascular/Lymphatic: No significant vascular findings are present. No enlarged abdominal or pelvic lymph nodes. Reproductive: Status post hysterectomy. No adnexal masses. Other: Small amount of abdominopelvic ascites, likely reactive. Musculoskeletal: Lumbar spine posterior fusion hardware. IMPRESSION: There are several loops of distal ileum with bowel wall thickening and adjacent mesenteric fat stranding, consistent with enteritis, which may be infectious or inflammatory in etiology. Small amount of abdominopelvic ascites, likely reactive. Electronically Signed   By: Emmaline Kluver M.D.   On: 01/11/2020 15:21   US Abdomen Limited RUQ  Result Date: 01/11/2020 CLINICAL DATA:  Abdominal pain EXAM: ULTRASOUND ABDOMEN LIMITED RIGHT UPPER QUADRANT COMPARISON:  CT from earlier in the same day. FINDINGS: Gallbladder: No gallstones or wall thickening visualized. No sonographic Murphy sign noted by sonographer. Common bile duct: Diameter: 4.5 mm. Liver: No focal lesion identified. Within normal limits in parenchymal echogenicity. Portal vein is patent on color Doppler imaging with normal direction of blood flow towards the liver. Other: None. IMPRESSION: No acute  abnormality noted. Electronically Signed   By: Alcide Clever M.D.   On: 01/11/2020 19:52     Medications   Scheduled Meds: . albuterol  8 mg Oral BID  . amitriptyline  25 mg Oral QHS  . [START ON 01/13/2020] buPROPion  150 mg Oral Daily  . capsaicin   Topical BID  . enoxaparin (LOVENOX) injection  40 mg Subcutaneous Q24H  . magnesium citrate  1 Bottle Oral Once  . pantoprazole (PROTONIX) IV  40 mg Intravenous Q12H  . polyethylene glycol-electrolytes  4,000 mL Oral Once  . rOPINIRole  1 mg Oral QHS  . rOPINIRole  1 mg Oral Once  . topiramate  25 mg Oral QHS  . [START ON 01/13/2020] topiramate  50 mg Oral Daily   Continuous Infusions: . sodium chloride    . cefTRIAXone (ROCEPHIN)  IV Stopped (01/11/20 5945)  . metronidazole Stopped (01/12/20 1443)       LOS: 0 days    Time spent: 25 minutes    Pennie Banter, DO [facility]  01/12/2020, 4:52 PM    If 7PM-7AM, please contact night-coverage. How to contact the [facility] Attending or Consulting provider 7A - 7P or covering provider during after hours 7P -7A, for this patient?    1. Check the care team in [facility] and look for a) attending/consulting [facility] provider listed and b) the [facility] team listed 2. Log into [email or web address] and use [place] universal password to access. If you do not have the password, please contact the hospital operator. 3. Locate the [facility] provider you are looking for under [facility] and page to a number that you can be directly reached. 4. If you still have difficulty reaching the provider, please page the [facility] (Director on Call) for the Hospitalists listed on amion for assistance.

## 2020-01-13 DIAGNOSIS — A044 Other intestinal Escherichia coli infections: Principal | ICD-10-CM

## 2020-01-13 DIAGNOSIS — A040 Enteropathogenic Escherichia coli infection: Secondary | ICD-10-CM

## 2020-01-13 LAB — GASTROINTESTINAL PANEL BY PCR, STOOL (REPLACES STOOL CULTURE)

## 2020-01-13 LAB — CBC
HCT: 37.7 % (ref 36.0–46.0)
Hemoglobin: 12.6 g/dL (ref 12.0–15.0)
MCH: 28.5 pg (ref 26.0–34.0)
MCHC: 33.4 g/dL (ref 30.0–36.0)
MCV: 85.3 fL (ref 80.0–100.0)
Platelets: 371 10*3/uL (ref 150–400)
RBC: 4.42 MIL/uL (ref 3.87–5.11)
RDW: 13.1 % (ref 11.5–15.5)
WBC: 13.7 10*3/uL — ABNORMAL HIGH (ref 4.0–10.5)
nRBC: 0 % (ref 0.0–0.2)

## 2020-01-13 SURGERY — ESOPHAGOGASTRODUODENOSCOPY (EGD) WITH PROPOFOL
Anesthesia: General

## 2020-01-13 MED ORDER — ONDANSETRON 8 MG PO TBDP: 8.0000 mg | ORAL_TABLET | Freq: Three times a day (TID) | ORAL | 1 refills | Status: DC | PRN

## 2020-01-13 MED ORDER — SODIUM CHLORIDE 0.9 % IV SOLN
500.0000 mg | INTRAVENOUS | Status: DC
  Administered 2020-01-13: 500 mg via INTRAVENOUS
  Filled 2020-01-13 (×2): qty 500

## 2020-01-13 NOTE — Discharge Summary (Signed)
Physician Discharge Summary  [name] YQI:[id number] DOB: 1960/06/08 DOA: 01/11/2020  PCP: Kaleen MaskElkins, Wilson Oliver, MD  Admit date: 01/11/2020 Discharge date: 01/13/2020  Admitted From: home Disposition:  home  Recommendations for Outpatient Follow-up:  1. Follow up with PCP in 1-2 weeks 2. Please obtain BMP/CBC in one week    Home Health: No  Equipment/Devices: None  Discharge Condition: Stable CODE STATUS: Full Diet recommendation: Regular   Discharge Diagnoses: Principal Problem:   Intractable abdominal pain Active Problems:   Nausea & vomiting   Leukocytosis   Obesity   History of migraine   Chronic back pain   Hx of iron deficiency anemia   Depression   Enteritis    Summary of HPI and Hospital Course:  Tara Fullingatricia R Chowning is a 59 y.o. female with medical history significant of migraines, iron deficiency, depression/anxiety, chronic back pain s/p multiple surgeries who presented to the ED with severe abdominal pain, nausea with dry heaving and loose stool.  The pain woke her up from sleep and had remained constant, sharp, located mid-abdomen above umbilicus and radiates upward and bilaterally.  ED Course: vitals within normal aside from BP 150/88.  CBC with leukocytosis of 15.2k, otherwise normal. CMP and lipase were normal.  Lactic acid 1.1.  UA negative.     CT abdomen/pelvis impression: There are several loops of distal ileum with bowel wall thickening and adjacent mesenteric fat stranding, consistent with enteritis, which may be infectious or inflammatory in etiology. Small amount of abdominopelvic ascites, likely reactive.   Treated with morphine and dilaudid in the ED with no relief of her pain.  Admitted to hospitalist service with GI consulted for further evaluation and management.     Stool studies were obtained.  Patient was started on empiric IV Rocephin and Flagyl.  Plan was for colonoscopy.  Bowel prep was limited by patients ongoing nausea.  Stool  studies subsequently returned positive for Enteropathogenic E. Coli.   Antibiotics were transitioned, treated with 1g IV Zithromax due to her ongoing significant pain and nausea.    Patient tolerating oral intake and nausea fairly well controlled with PO antiemetics, she requested discharge home to continue recovery.  Husband was at bedside during encounter.  Both agreeable to return to the ED if unable to take in adequate hydration or if worsening pain or fevers.  She was discharged in stable condition and clinically improving.     Discharge Instructions    Allergies as of 01/13/2020      Reactions   Fish Allergy Anaphylaxis   To all fish, not just shellfish.  Cannot tolerate even the smell of seafood without her airway swelling/closing.   Other Shortness Of Breath   Whipped cream and pudding   Nortriptyline Other (See Comments)   hallucinations   Cyclobenzaprine    Tylenol [acetaminophen] Other (See Comments)   Cannot take due to past liver failure   Bee Venom Hives   Flexeril [cyclobenzaprine Hcl] Other (See Comments)   Intense anger      Medication List    TAKE these medications   albuterol 4 MG 12 hr tablet Commonly known as: VOSPIRE ER Take 8 mg by mouth 2 (two) times daily.   amitriptyline 25 MG tablet Commonly known as: ELAVIL Take 25 mg by mouth at bedtime.   buPROPion 150 MG 24 hr tablet Commonly known as: WELLBUTRIN XL every morning. TAKE 1 TO 3 TABLETS BY MOUTH EVERY DAY   celecoxib 200 MG capsule Commonly known as: CELEBREX Take  1 capsule (200 mg total) by mouth every 12 (twelve) hours. Take along with Pepcid to help avoid GI upset. What changed: when to take this   EpiPen 2-Pak 0.3 mg/0.3 mL Soaj injection Generic drug: EPINEPHrine Inject 0.3 mg as directed as needed (allergic reaction).   ibuprofen 200 MG tablet Commonly known as: ADVIL Take 200 mg by mouth every 6 (six) hours as needed.   morphine 15 MG tablet Commonly known as: MSIR Take 15 mg by  mouth every 6 (six) hours as needed for severe pain.   ondansetron 8 MG disintegrating tablet Commonly known as: Zofran ODT Take 1 tablet (8 mg total) by mouth every 8 (eight) hours as needed for nausea or vomiting.   ondansetron 8 MG tablet Commonly known as: ZOFRAN Take 8 mg by mouth 3 (three) times daily as needed for nausea or vomiting.   rOPINIRole 2 MG tablet Commonly known as: REQUIP Take 1 mg by mouth at bedtime.   topiramate 25 MG tablet Commonly known as: TOPAMAX Take 25-50 mg by mouth 2 (two) times daily. Take  in the morning and s at night       Allergies  Allergen Reactions  . Fish Allergy Anaphylaxis    To all fish, not just shellfish.  Cannot tolerate even the smell of seafood without her airway swelling/closing.  . Other Shortness Of Breath    Whipped cream and pudding  . Nortriptyline Other (See Comments)    hallucinations  . Cyclobenzaprine   . Tylenol [Acetaminophen] Other (See Comments)    Cannot take due to past liver failure  . Bee Venom Hives  . Flexeril [Cyclobenzaprine Hcl] Other (See Comments)    Intense anger    Consultations:  Gastroenterology   Procedures/Studies: CT ABDOMEN PELVIS W CONTRAST  Result Date: 01/11/2020 CLINICAL DATA:  Pt here with c/o lower abd cramping and pain that began this am, denies any pain with urination, however, did notice some blood in her urine at few days ago. H EXAM: CT ABDOMEN AND PELVIS WITH CONTRAST TECHNIQUE: Multidetector CT imaging of the abdomen and pelvis was performed using the standard protocol following bolus administration of intravenous contrast. CONTRAST:  OMNIPAQUE IOHEXOL 300 MG/ML  SOLN COMPARISON:  CT abdomen pelvis 02/21/2017 FINDINGS: Lower chest: No acute abnormality. Hepatobiliary: No focal liver abnormality is seen. No gallstones, gallbladder wall thickening, or biliary dilatation. Pancreas: Unremarkable. No pancreatic ductal dilatation or surrounding inflammatory changes.  Spleen: Normal in size without focal abnormality. Adrenals/Urinary Tract: Adrenal glands are unremarkable. Kidneys are normal, without renal calculi, focal lesion, or hydronephrosis. Bladder is unremarkable. Stomach/Bowel: Stomach is within normal limits. Appendix is not visualized. There are several loops of distal ileum with bowel wall thickening and adjacent mesenteric fat stranding. No dilation to suggest obstruction. No free air. No pneumatosis. The remainder of the bowel is normal in appearance. Vascular/Lymphatic: No significant vascular findings are present. No enlarged abdominal or pelvic lymph nodes. Reproductive: Status post hysterectomy. No adnexal masses. Other: Small amount of abdominopelvic ascites, likely reactive. Musculoskeletal: Lumbar spine posterior fusion hardware. IMPRESSION: There are several loops of distal ileum with bowel wall thickening and adjacent mesenteric fat stranding, consistent with enteritis, which may be infectious or inflammatory in etiology. Small amount of abdominopelvic ascites, likely reactive. Electronically Signed   By: Emmaline Kluver M.D.   On: 01/11/2020 15:21   US Abdomen Limited RUQ  Result Date: 01/11/2020 CLINICAL DATA:  Abdominal pain EXAM: ULTRASOUND ABDOMEN LIMITED RIGHT UPPER QUADRANT COMPARISON:  CT from earlier  in the same day. FINDINGS: Gallbladder: No gallstones or wall thickening visualized. No sonographic Murphy sign noted by sonographer. Common bile duct: Diameter: 4.5 mm. Liver: No focal lesion identified. Within normal limits in parenchymal echogenicity. Portal vein is patent on color Doppler imaging with normal direction of blood flow towards the liver. Other: None. IMPRESSION: No acute abnormality noted. Electronically Signed   By: Alcide Clever M.D.   On: 01/11/2020 19:52       Subjective: Patient seen with husband at bedside.  She reports ongoing nausea, but gets fair relief with antiemetics.  Thinks Zofran at home will be sufficient.   Reports able to keep down liquids.  Doesn't eat much at baseline, tolerating some food.  Thinks undercooked bacon from a restaurant caused her illness.  No fevers or chills.  Pain little better, tolerating mobility and ambulation better today.    Discharge Exam: Vitals:   01/13/20 0002 01/13/20 0445  BP: (!) 142/74 126/81  Pulse: (!) 107 85  Resp: 20 20  Temp: 99 F (37.2 C) 98 F (36.7 C)  SpO2: 99% 98%   Vitals:   01/12/20 1600 01/12/20 1619 01/13/20 0002 01/13/20 0445  BP: (!) 140/94 137/81 (!) 142/74 126/81  Pulse: 72 62 (!) 107 85  Resp: Temp:  98.5 F (36.9 C) 99 F (37.2 C) 98 F (36.7 C)  TempSrc:  Oral Oral Oral  SpO2: 98% 98% 99% 98%  Weight:      Height:        General: Pt is alert, awake, not in acute distress Cardiovascular: RRR, S1/S2 +, no rubs, no gallops Respiratory: CTA bilaterally, no wheezing, no rhonchi Abdominal: Soft, mild tenderness on palpation without guarding or rebound, ND, bowel sounds + Extremities: no edema, no cyanosis    The results of significant diagnostics from this hospitalization (including imaging, microbiology, ancillary and laboratory) are listed below for reference.     Microbiology: Recent Results (from the past 240 hour(s))  Gastrointestinal Panel by PCR , Stool     Status: Abnormal   Collection Time: 01/11/20  1:08 AM   Specimen: Stool  Result Value Ref Range Status   Campylobacter species NOT DETECTED NOT DETECTED Final   Plesimonas shigelloides NOT DETECTED NOT DETECTED Final   Salmonella species NOT DETECTED NOT DETECTED Final   Yersinia enterocolitica NOT DETECTED NOT DETECTED Final   Vibrio species NOT DETECTED NOT DETECTED Final   Vibrio cholerae NOT DETECTED NOT DETECTED Final   Enteroaggregative E coli (EAEC) NOT DETECTED NOT DETECTED Final   Enteropathogenic E coli (EPEC) DETECTED (A) NOT DETECTED Final    Comment: RESULT CALLED TO, READ BACK BY AND VERIFIED WITH: Lyndel Pleasure WARD RN AT [phone]  ON 01/13/20 SNG    Enterotoxigenic E coli (ETEC) NOT DETECTED NOT DETECTED Final   Shiga like toxin producing E coli (STEC) NOT DETECTED NOT DETECTED Final   Shigella/Enteroinvasive E coli (EIEC) NOT DETECTED NOT DETECTED Final   Cryptosporidium NOT DETECTED NOT DETECTED Final   Cyclospora cayetanensis NOT DETECTED NOT DETECTED Final   Entamoeba histolytica NOT DETECTED NOT DETECTED Final   Giardia lamblia NOT DETECTED NOT DETECTED Final   Adenovirus F40/41 NOT DETECTED NOT DETECTED Final   Astrovirus NOT DETECTED NOT DETECTED Final   Norovirus GI/GII NOT DETECTED NOT DETECTED Final   Rotavirus A NOT DETECTED NOT DETECTED Final   Sapovirus (I, II, IV, and V) NOT DETECTED NOT DETECTED Final    Comment: Performed at [facility],  [address], [place], [place] [address]  SARS Coronavirus 2 by RT PCR (hospital order, performed in [facility] hospital lab) Nasopharyngeal Nasopharyngeal Swab     Status: None   Collection Time: 01/11/20  4:08 PM   Specimen: Nasopharyngeal Swab  Result Value Ref Range Status   SARS Coronavirus 2 NEGATIVE NEGATIVE Final    Comment: (NOTE) SARS-CoV-2 target nucleic acids are NOT DETECTED.  The SARS-CoV-2 RNA is generally detectable in upper and lower respiratory specimens during the acute phase of infection. The lowest concentration of SARS-CoV-2 viral copies this assay can detect is 250 copies / mL. A negative result does not preclude SARS-CoV-2 infection and should not be used as the sole basis for treatment or other patient management decisions.  A negative result may occur with improper specimen collection / handling, submission of specimen other than nasopharyngeal swab, presence of viral mutation(s) within the areas targeted by this assay, and inadequate number of viral copies (<250 copies / mL). A negative result must be combined with clinical observations, patient history, and epidemiological information.  Fact Sheet for Patients:    BoilerBrush.com.cy  Fact Sheet for Healthcare Providers: https://pope.com/  This test is not yet approved or  cleared by the [place] FDA and has been authorized for detection and/or diagnosis of SARS-CoV-2 by FDA under an Emergency Use Authorization (EUA).  This EUA will remain in effect (meaning this test can be used) for the duration of the COVID-19 declaration under Section 564(b)(1) of the Act, 21 U.S.C. section 360bbb-3(b)(1), unless the authorization is terminated or revoked sooner.  Performed at [facility], [address] [place], [place] [address]      Labs: BNP (last 3 results) No results for input(s): BNP in the last 8760 hours. Basic Metabolic Panel: Recent Labs  Lab 01/11/20 1411 01/12/20 0337  NA 140 139  K 4.4 3.9  CL 105 105  CO2 24 25  GLUCOSE 97 97  BUN 9 7  CREATININE 0.68 0.69  CALCIUM 9.1 8.8*   Liver Function Tests: Recent Labs  Lab 01/11/20 1411 01/12/20 0337  AST 18 20  ALT 13 13  ALKPHOS 99 90  BILITOT 0.8 0.9  PROT 7.8 7.2  ALBUMIN 4.2 4.1   Recent Labs  Lab 01/11/20 1411  LIPASE 22   No results for input(s): AMMONIA in the last 168 hours. CBC: Recent Labs  Lab 01/11/20 1411 01/12/20 0337 01/13/20 0412  WBC 15.2* 13.4* 13.7*  HGB 13.5 12.7 12.6  HCT 41.8 40.1 37.7  MCV 87.4 88.5 85.3  PLT 362 363 371   Cardiac Enzymes: No results for input(s): CKTOTAL, CKMB, CKMBINDEX, TROPONINI in the last 168 hours. BNP: Invalid input(s): POCBNP CBG: No results for input(s): GLUCAP in the last 168 hours. D-Dimer No results for input(s): DDIMER in the last 72 hours. Hgb A1c No results for input(s): HGBA1C in the last 72 hours. Lipid Profile No results for input(s): CHOL, HDL, LDLCALC, TRIG, CHOLHDL, LDLDIRECT in the last 72 hours. Thyroid function studies No results for input(s): TSH, T4TOTAL, T3FREE, THYROIDAB in the last 72 hours.  Invalid input(s):  FREET3 Anemia work up Recent Labs    01/12/20 0337  FERRITIN 58  TIBC 364  IRON 80   Urinalysis    Component Value Date/Time   COLORURINE YELLOW (A) 01/11/2020 1411   APPEARANCEUR CLEAR (A) 01/11/2020 1411   LABSPEC 1.011 01/11/2020 1411   PHURINE 9.0 (H) 01/11/2020 1411   GLUCOSEU NEGATIVE 01/11/2020 1411   HGBUR NEGATIVE  01/11/2020 1411   BILIRUBINUR NEGATIVE 01/11/2020 1411   KETONESUR NEGATIVE 01/11/2020 1411   PROTEINUR NEGATIVE 01/11/2020 1411   UROBILINOGEN 1.0 01/03/2009 0008   NITRITE NEGATIVE 01/11/2020 1411   LEUKOCYTESUR NEGATIVE 01/11/2020 1411   Sepsis Labs Invalid input(s): PROCALCITONIN,  WBC,  LACTICIDVEN Microbiology Recent Results (from the past 240 hour(s))  Gastrointestinal Panel by PCR , Stool     Status: Abnormal   Collection Time: 01/11/20  1:08 AM   Specimen: Stool  Result Value Ref Range Status   Campylobacter species NOT DETECTED NOT DETECTED Final   Plesimonas shigelloides NOT DETECTED NOT DETECTED Final   Salmonella species NOT DETECTED NOT DETECTED Final   Yersinia enterocolitica NOT DETECTED NOT DETECTED Final   Vibrio species NOT DETECTED NOT DETECTED Final   Vibrio cholerae NOT DETECTED NOT DETECTED Final   Enteroaggregative E coli (EAEC) NOT DETECTED NOT DETECTED Final   Enteropathogenic E coli (EPEC) DETECTED (A) NOT DETECTED Final    Comment: RESULT CALLED TO, READ BACK BY AND VERIFIED WITH: Lyndel Pleasure WARD RN AT 0622 ON 01/13/20 SNG    Enterotoxigenic E coli (ETEC) NOT DETECTED NOT DETECTED Final   Shiga like toxin producing E coli (STEC) NOT DETECTED NOT DETECTED Final   Shigella/Enteroinvasive E coli (EIEC) NOT DETECTED NOT DETECTED Final   Cryptosporidium NOT DETECTED NOT DETECTED Final   Cyclospora cayetanensis NOT DETECTED NOT DETECTED Final   Entamoeba histolytica NOT DETECTED NOT DETECTED Final   Giardia lamblia NOT DETECTED NOT DETECTED Final   Adenovirus F40/41 NOT DETECTED NOT DETECTED Final   Astrovirus NOT  DETECTED NOT DETECTED Final   Norovirus GI/GII NOT DETECTED NOT DETECTED Final   Rotavirus A NOT DETECTED NOT DETECTED Final   Sapovirus (I, II, IV, and V) NOT DETECTED NOT DETECTED Final    Comment: Performed at [facility], [address] [place], [place] [address]  SARS Coronavirus 2 by RT PCR (hospital order, performed in [facility] hospital lab) Nasopharyngeal Nasopharyngeal Swab     Status: None   Collection Time: 01/11/20  4:08 PM   Specimen: Nasopharyngeal Swab  Result Value Ref Range Status   SARS Coronavirus 2 NEGATIVE NEGATIVE Final    Comment: (NOTE) SARS-CoV-2 target nucleic acids are NOT DETECTED.  The SARS-CoV-2 RNA is generally detectable in upper and lower respiratory specimens during the acute phase of infection. The lowest concentration of SARS-CoV-2 viral copies this assay can detect is 250 copies / mL. A negative result does not preclude SARS-CoV-2 infection and should not be used as the sole basis for treatment or other patient management decisions.  A negative result may occur with improper specimen collection / handling, submission of specimen other than nasopharyngeal swab, presence of viral mutation(s) within the areas targeted by this assay, and inadequate number of viral copies (<250 copies / mL). A negative result must be combined with clinical observations, patient history, and epidemiological information.  Fact Sheet for Patients:   BoilerBrush.com.cy  Fact Sheet for Healthcare Providers: https://pope.com/  This test is not yet approved or  cleared by the [place] FDA and has been authorized for detection and/or diagnosis of SARS-CoV-2 by FDA under an Emergency Use Authorization (EUA).  This EUA will remain in effect (meaning this test can be used) for the duration of the COVID-19 declaration under Section 564(b)(1) of the Act, 21 U.S.C. section 360bbb-3(b)(1), unless the authorization  is terminated or revoked sooner.  Performed at [facility], [address], [place], [place] [address]  Time coordinating discharge: Over 30 minutes  SIGNED:   Pennie Banter, DO [facility] 01/13/2020, 12:10 PM   If 7PM-7AM, please contact night-coverage [email or web address]

## 2020-01-13 NOTE — Plan of Care (Signed)

## 2020-01-13 NOTE — Progress Notes (Signed)
Patient c/o unrelieved nausea. Provider notified, order placed for reglan IV. Patient reports that her nausea has improved significantly

## 2020-01-13 NOTE — Progress Notes (Signed)
Arlyss Repressohini R Bret Stamour, MD [address]  [address]  [place], [place] [address]  Main: [phone]  Fax: [phone] Pager: [phone]   Subjective: No acute events overnight.  Stool studies came back positive for E. Coli    Objective: Vital signs in last 24 hours: Vitals:   01/12/20 1600 01/12/20 1619 01/13/20 0002 01/13/20 0445  BP: (!) 140/94 137/81 (!) 142/74 126/81  Pulse: 72 62 (!) 107 85  Resp: 18 16 20 20   Temp:  98.5 F (36.9 C) 99 F (37.2 C) 98 F (36.7 C)  TempSrc:  Oral Oral Oral  SpO2: 98% 98% 99% 98%  Weight:      Height:       Weight change:   Intake/Output Summary (Last 24 hours) at 01/13/2020 1501 Last data filed at 01/12/2020 1800 Gross per 24 hour  Intake 410.4 ml  Output --  Net 410.4 ml     Exam: Heart:: Regular rate and rhythm, S1S2 present or without murmur or extra heart sounds Lungs: normal and clear to auscultation Abdomen: soft, nontender, normal bowel sounds   Lab Results: CBC Latest Ref Rng & Units 01/13/2020 01/12/2020 01/11/2020  WBC 4.0 - 10.5 K/uL 13.7(H) 13.4(H) 15.2(H)  Hemoglobin 12.0 - 15.0 g/dL 36.612.6 44.012.7 34.713.5  Hematocrit 36 - 46 % 37.7 40.1 41.8  Platelets 150 - 400 K/uL 371 363 362   CMP Latest Ref Rng & Units 01/12/2020 01/11/2020 06/07/2018  Glucose 70 - 99 mg/dL 97 97 425(Z114(H)  BUN 6 - 20 mg/dL 7 9 14   Creatinine 0.44 - 1.00 mg/dL 5.630.69 8.750.68 6.430.83  Sodium 135 - 145 mmol/L 139 140 142  Potassium 3.5 - 5.1 mmol/L 3.9 4.4 4.4  Chloride 98 - 111 mmol/L 105 105 107  CO2 22 - 32 mmol/L 25 24 29   Calcium 8.9 - 10.3 mg/dL 3.2(R8.8(L) 9.1 9.6  Total Protein 6.5 - 8.1 g/dL 7.2 7.8 6.6  Total Bilirubin 0.3 - 1.2 mg/dL 0.9 0.8 0.4  Alkaline Phos 38 - 126 U/L 90 99 99  AST 15 - 41 U/L 20 18 13(L)  ALT 0 - 44 U/L 13 13 9     Micro Results: Recent Results (from the past 240 hour(s))  Gastrointestinal Panel by PCR , Stool     Status: Abnormal   Collection Time: 01/11/20  1:08 AM   Specimen: Stool  Result Value Ref Range Status    Campylobacter species NOT DETECTED NOT DETECTED Final   Plesimonas shigelloides NOT DETECTED NOT DETECTED Final   Salmonella species NOT DETECTED NOT DETECTED Final   Yersinia enterocolitica NOT DETECTED NOT DETECTED Final   Vibrio species NOT DETECTED NOT DETECTED Final   Vibrio cholerae NOT DETECTED NOT DETECTED Final   Enteroaggregative E coli (EAEC) NOT DETECTED NOT DETECTED Final   Enteropathogenic E coli (EPEC) DETECTED (A) NOT DETECTED Final    Comment: RESULT CALLED TO, READ BACK BY AND VERIFIED WITH: Lyndel PleasureJASEY VAUGHANS WARD RN AT [phone] ON 01/13/20 SNG    Enterotoxigenic E coli (ETEC) NOT DETECTED NOT DETECTED Final   Shiga like toxin producing E coli (STEC) NOT DETECTED NOT DETECTED Final   Shigella/Enteroinvasive E coli (EIEC) NOT DETECTED NOT DETECTED Final   Cryptosporidium NOT DETECTED NOT DETECTED Final   Cyclospora cayetanensis NOT DETECTED NOT DETECTED Final   Entamoeba histolytica NOT DETECTED NOT DETECTED Final   Giardia lamblia NOT DETECTED NOT DETECTED Final   Adenovirus F40/41 NOT DETECTED NOT DETECTED Final   Astrovirus NOT DETECTED NOT DETECTED Final  Norovirus GI/GII NOT DETECTED NOT DETECTED Final   Rotavirus A NOT DETECTED NOT DETECTED Final   Sapovirus (I, II, IV, and V) NOT DETECTED NOT DETECTED Final    Comment: Performed at [facility], [address] [place], [place] [address]  SARS Coronavirus 2 by RT PCR (hospital order, performed in [facility] hospital lab) Nasopharyngeal Nasopharyngeal Swab     Status: None   Collection Time: 01/11/20  4:08 PM   Specimen: Nasopharyngeal Swab  Result Value Ref Range Status   SARS Coronavirus 2 NEGATIVE NEGATIVE Final    Comment: (NOTE) SARS-CoV-2 target nucleic acids are NOT DETECTED.  The SARS-CoV-2 RNA is generally detectable in upper and lower respiratory specimens during the acute phase of infection. The lowest concentration of SARS-CoV-2 viral copies this assay can detect is 250 copies / mL. A  negative result does not preclude SARS-CoV-2 infection and should not be used as the sole basis for treatment or other patient management decisions.  A negative result may occur with improper specimen collection / handling, submission of specimen other than nasopharyngeal swab, presence of viral mutation(s) within the areas targeted by this assay, and inadequate number of viral copies (<250 copies / mL). A negative result must be combined with clinical observations, patient history, and epidemiological information.  Fact Sheet for Patients:   BoilerBrush.com.cy  Fact Sheet for Healthcare Providers: https://pope.com/  This test is not yet approved or  cleared by the [place] FDA and has been authorized for detection and/or diagnosis of SARS-CoV-2 by FDA under an Emergency Use Authorization (EUA).  This EUA will remain in effect (meaning this test can be used) for the duration of the COVID-19 declaration under Section 564(b)(1) of the Act, 21 U.S.C. section 360bbb-3(b)(1), unless the authorization is terminated or revoked sooner.  Performed at [facility], [address] [place], [place] [address]    Studies/Results: CT ABDOMEN PELVIS W CONTRAST  Result Date: 01/11/2020 CLINICAL DATA:  Pt here with c/o lower abd cramping and pain that began this am, denies any pain with urination, however, did notice some blood in her urine at few days ago. H EXAM: CT ABDOMEN AND PELVIS WITH CONTRAST TECHNIQUE: Multidetector CT imaging of the abdomen and pelvis was performed using the standard protocol following bolus administration of intravenous contrast. CONTRAST:  OMNIPAQUE IOHEXOL 300 MG/ML  SOLN COMPARISON:  CT abdomen pelvis 02/21/2017 FINDINGS: Lower chest: No acute abnormality. Hepatobiliary: No focal liver abnormality is seen. No gallstones, gallbladder wall thickening, or biliary dilatation. Pancreas: Unremarkable. No  pancreatic ductal dilatation or surrounding inflammatory changes. Spleen: Normal in size without focal abnormality. Adrenals/Urinary Tract: Adrenal glands are unremarkable. Kidneys are normal, without renal calculi, focal lesion, or hydronephrosis. Bladder is unremarkable. Stomach/Bowel: Stomach is within normal limits. Appendix is not visualized. There are several loops of distal ileum with bowel wall thickening and adjacent mesenteric fat stranding. No dilation to suggest obstruction. No free air. No pneumatosis. The remainder of the bowel is normal in appearance. Vascular/Lymphatic: No significant vascular findings are present. No enlarged abdominal or pelvic lymph nodes. Reproductive: Status post hysterectomy. No adnexal masses. Other: Small amount of abdominopelvic ascites, likely reactive. Musculoskeletal: Lumbar spine posterior fusion hardware. IMPRESSION: There are several loops of distal ileum with bowel wall thickening and adjacent mesenteric fat stranding, consistent with enteritis, which may be infectious or inflammatory in etiology. Small amount of abdominopelvic ascites, likely reactive. Electronically Signed   By: Emmaline Kluver M.D.   On: 01/11/2020 15:21  US Abdomen Limited RUQ  Result Date: 01/11/2020 CLINICAL DATA:  Abdominal pain EXAM: ULTRASOUND ABDOMEN LIMITED RIGHT UPPER QUADRANT COMPARISON:  CT from earlier in the same day. FINDINGS: Gallbladder: No gallstones or wall thickening visualized. No sonographic Murphy sign noted by sonographer. Common bile duct: Diameter: 4.5 mm. Liver: No focal lesion identified. Within normal limits in parenchymal echogenicity. Portal vein is patent on color Doppler imaging with normal direction of blood flow towards the liver. Other: None. IMPRESSION: No acute abnormality noted. Electronically Signed   By: Alcide Clever M.D.   On: 01/11/2020 19:52   Medications:  I have reviewed the patient's current medications. Prior to Admission:  Medications  Prior to Admission  Medication Sig Dispense Refill Last Dose  . albuterol (VOSPIRE ER) 4 MG 12 hr tablet Take 8 mg by mouth 2 (two) times daily.   01/10/2020 at 2100  . amitriptyline (ELAVIL) 25 MG tablet Take 25 mg by mouth at bedtime.    01/10/2020 at 2100  . buPROPion (WELLBUTRIN XL) 150 MG 24 hr tablet every morning. TAKE 1 TO 3 TABLETS BY MOUTH EVERY DAY   01/10/2020 at 0900  . celecoxib (CELEBREX) 200 MG capsule Take 1 capsule (200 mg total) by mouth every 12 (twelve) hours. Take along with Pepcid to help avoid GI upset. (Patient taking differently: Take 200 mg by mouth daily. Take along with Pepcid to help avoid GI upset.) 60 capsule 0 01/10/2020 at 0900  . EPIPEN 2-PAK 0.3 MG/0.3ML SOAJ injection Inject 0.3 mg as directed as needed (allergic reaction).    prn at prn  . ibuprofen (ADVIL) 200 MG tablet Take 200 mg by mouth every 6 (six) hours as needed.   Past Month at PRN  . morphine (MSIR) 15 MG tablet Take 15 mg by mouth every 6 (six) hours as needed for severe pain.    01/11/2020 at 0700  . ondansetron (ZOFRAN) 8 MG tablet Take 8 mg by mouth 3 (three) times daily as needed for nausea or vomiting.    01/11/2020 at 0700  . rOPINIRole (REQUIP) 2 MG tablet Take 1 mg by mouth at bedtime.    01/10/2020 at 2100  . topiramate (TOPAMAX) 25 MG tablet Take 25-50 mg by mouth 2 (two) times daily. Take 50mg  in the morning and 25mg s at night   01/10/2020 at 2100   Scheduled: . albuterol  8 mg Oral BID  . amitriptyline  25 mg Oral QHS  . buPROPion  150 mg Oral Daily  . capsaicin   Topical BID  . enoxaparin (LOVENOX) injection  40 mg Subcutaneous Q24H  . pantoprazole (PROTONIX) IV  40 mg Intravenous Q12H  . rOPINIRole  1 mg Oral QHS  . topiramate  25 mg Oral QHS  . topiramate  50 mg Oral Daily   Continuous: . sodium chloride 20 mL/hr at 01/12/20 1704  . azithromycin 500 mg (01/13/20 0927)   01/14/20 **OR** acetaminophen, ketorolac, metoCLOPramide (REGLAN) injection, morphine, ondansetron **OR**  ondansetron (ZOFRAN) IV, oxyCODONE, promethazine Anti-infectives (From admission, onward)   Start     Dose/Rate Route Frequency Ordered Stop   01/13/20 0800  azithromycin (ZITHROMAX) 500 mg in sodium chloride 0.9 % 250 mL IVPB        500 mg 250 mL/hr over 60 Minutes Intravenous Every 24 hours 01/13/20 0729 01/16/20 0759   01/11/20 2000  cefTRIAXone (ROCEPHIN) 2 g in sodium chloride 0.9 % 100 mL IVPB  Status:  Discontinued        2 g 200 mL/hr  over 30 Minutes Intravenous Every 24 hours 01/11/20 1759 01/13/20 0728   01/11/20 2000  metroNIDAZOLE (FLAGYL) IVPB 500 mg  Status:  Discontinued        500 mg 100 mL/hr over 60 Minutes Intravenous Every 8 hours 01/11/20 1759 01/13/20 0728     Scheduled Meds: . albuterol  8 mg Oral BID  . amitriptyline  25 mg Oral QHS  . buPROPion  150 mg Oral Daily  . capsaicin   Topical BID  . enoxaparin (LOVENOX) injection  40 mg Subcutaneous Q24H  . pantoprazole (PROTONIX) IV  40 mg Intravenous Q12H  . rOPINIRole  1 mg Oral QHS  . topiramate  25 mg Oral QHS  . topiramate  50 mg Oral Daily   Continuous Infusions: . sodium chloride 20 mL/hr at 01/12/20 1704  . azithromycin 500 mg (01/13/20 0927)   PRN Meds:.acetaminophen **OR** acetaminophen, ketorolac, metoCLOPramide (REGLAN) injection, morphine, ondansetron **OR** ondansetron (ZOFRAN) IV, oxyCODONE, promethazine   Assessment: Principal Problem:   Intractable abdominal pain Active Problems:   Depression   Obesity   Nausea & vomiting   History of migraine   Chronic back pain   Leukocytosis   Hx of iron deficiency anemia   Enteritis  Enteritis secondary to E. coli infection  Plan: Do not recommend endoscopy and colonoscopy at this time Switch from IV antibiotics to azithromycin Recommend screening colonoscopy as outpatient Diet as tolerated Okay to discharge home from GI standpoint   LOS: 1 day   Leetta Hendriks 01/13/2020, 3:01 PM

## 2020-01-13 NOTE — Discharge Instructions (Signed)
You can eat whatever foods you can tolerate.  I would avoid things that make your nausea or pain worse. Be sure to stay well hydrated with water, and if not eating much food would drink Gatorade or Pedialyte.  I prescribed Zofran (ondansetron) oral dissolving tablets for your nausea.  If they are not enough to control the nausea, please call your primary care doctor. You should follow up with primary care in 1-2 weeks after discharge from the hospital.

## 2020-01-13 NOTE — Progress Notes (Signed)
Patient positive for Enteropathogenic E coli. Provider notified. Patient already on IV abx.  Sensitivity pending. No new orders at this time.

## 2020-01-13 NOTE — Progress Notes (Signed)
Patient encouraged to drink bowel prep for EGD and colonoscopy however patient reports that she has only been able to drink the magnesium citrate because of the nausea.

## 2020-01-14 ENCOUNTER — Telehealth: Payer: Self-pay

## 2020-01-14 ENCOUNTER — Other Ambulatory Visit: Payer: Self-pay

## 2020-01-14 DIAGNOSIS — Z1211 Encounter for screening for malignant neoplasm of colon: Secondary | ICD-10-CM

## 2020-01-14 MED ORDER — NA SULFATE-K SULFATE-MG SULF 17.5-3.13-1.6 GM/177ML PO SOLN: 354.0000 mL | Freq: Once | ORAL | 0 refills | Status: AC

## 2020-01-14 NOTE — Telephone Encounter (Signed)
Scheduled patient for colonoscopy on 02/04/2020. Went over instructions and mailed them to patient.

## 2020-01-14 NOTE — Telephone Encounter (Signed)
-----   Message from Toney Reil, MD sent at 01/13/2020  2:58 PM EDT ----- Regarding: Schedule screening colonoscopy Recommend screening colonoscopy in 2 to 3 weeks  RV

## 2020-01-22 DIAGNOSIS — A040 Enteropathogenic Escherichia coli infection: Secondary | ICD-10-CM | POA: Diagnosis present

## 2020-02-04 ENCOUNTER — Other Ambulatory Visit
Admission: RE | Admit: 2020-02-04 | Discharge: 2020-02-04 | Disposition: A | Discharge status: Home / Self Care | Payer: Medicare Other | Source: Ambulatory Visit | Attending: Gastroenterology | Admitting: Gastroenterology

## 2020-02-04 ENCOUNTER — Other Ambulatory Visit: Payer: Self-pay

## 2020-02-04 DIAGNOSIS — Z01812 Encounter for preprocedural laboratory examination: Secondary | ICD-10-CM | POA: Diagnosis present

## 2020-02-04 DIAGNOSIS — Z20822 Contact with and (suspected) exposure to covid-19: Secondary | ICD-10-CM | POA: Insufficient documentation

## 2020-02-05 ENCOUNTER — Telehealth: Payer: Self-pay | Admitting: Gastroenterology

## 2020-02-05 LAB — SARS CORONAVIRUS 2 (TAT 6-24 HRS): SARS Coronavirus 2: NEGATIVE

## 2020-02-05 NOTE — Telephone Encounter (Signed)
Patient called stating that she can not keep the prep medicine down and keeps vomiting it up. Patient has decided to cancel procedure scheduled on 02/06/20 and wants to reschedule. Per patient. Clinical staff was informed and will follow up with patient.

## 2020-02-06 ENCOUNTER — Ambulatory Visit: Admission: RE | Admit: 2020-02-06 | Payer: Medicare Other | Source: Home / Self Care | Admitting: Gastroenterology

## 2020-02-06 ENCOUNTER — Encounter: Admission: RE | Payer: Self-pay | Source: Home / Self Care

## 2020-02-06 ENCOUNTER — Other Ambulatory Visit: Payer: Self-pay

## 2020-02-06 DIAGNOSIS — Z1211 Encounter for screening for malignant neoplasm of colon: Secondary | ICD-10-CM

## 2020-02-06 SURGERY — COLONOSCOPY WITH PROPOFOL
Anesthesia: General

## 2020-02-06 MED ORDER — SUTAB 1479-225-188 MG PO TABS: 12.0000 | ORAL_TABLET | Freq: Two times a day (BID) | ORAL | 0 refills | Status: DC

## 2020-02-06 MED ORDER — PROPOFOL 10 MG/ML IV BOLUS
INTRAVENOUS | Status: AC
  Filled 2020-02-06: qty 20

## 2020-02-06 NOTE — Telephone Encounter (Signed)
Called Endo and informed ENDO of this information. Will call patient back and rescheduled later today

## 2020-02-06 NOTE — Telephone Encounter (Signed)
Called and moved patient procedure to 02/13/2020. Sent patient Sutab to the pharmacy. Patient verbalized understanding of instructions and sent them to her mychart also.

## 2020-02-08 ENCOUNTER — Telehealth: Payer: Self-pay

## 2020-02-08 NOTE — Telephone Encounter (Signed)
-----   Message from Wilnette Kales, [place] sent at 02/08/2020 11:36 AM EDT ----- Regarding: Patient question Raelyn Number,  Mrs. Galati left a voicemail saying she can not drink the liquid prep. I believe you sent in an order for the sutabs but her insurance will not cover it. We are out of sample here. Can you reach out to her. She can be reached at [phone].    Thanks, Jovon

## 2020-02-08 NOTE — Telephone Encounter (Signed)
Called and left a message for call back  

## 2020-02-11 ENCOUNTER — Other Ambulatory Visit: Admission: RE | Admit: 2020-02-11 | Payer: Medicare Other | Source: Ambulatory Visit

## 2020-02-11 ENCOUNTER — Telehealth: Payer: Self-pay

## 2020-02-11 NOTE — Telephone Encounter (Signed)
Patients call was returned in regards to her SuTab Bowel Prep Rx.  She states that [name] is not covered by her insurance and she would like to cancel procedure.  I offered alternative liquid bowel prep and she stated she can not tolerate the liquid bowel prep.  I checked to see if we had sample of Sutab but we did not have any.  She said she would like to cancel her colonoscopy since we do not have SuTab Samples and her insurance does not cover it.  Colonoscopy has been canceled per patient request with Endoscopy.  Thanks,  [place], [place]

## 2020-02-11 NOTE — Telephone Encounter (Signed)
Called and left a message for call back  

## 2020-02-13 ENCOUNTER — Encounter: Admission: RE | Payer: Self-pay | Source: Home / Self Care

## 2020-02-13 ENCOUNTER — Ambulatory Visit: Admission: RE | Admit: 2020-02-13 | Payer: Medicare Other | Source: Home / Self Care | Admitting: Gastroenterology

## 2020-02-13 SURGERY — COLONOSCOPY WITH PROPOFOL
Anesthesia: General

## 2020-07-18 ENCOUNTER — Encounter: Payer: Self-pay | Admitting: Emergency Medicine

## 2020-07-18 ENCOUNTER — Other Ambulatory Visit: Payer: Self-pay

## 2020-07-18 ENCOUNTER — Emergency Department
Admission: EM | Admit: 2020-07-18 | Discharge: 2020-07-18 | Disposition: A | Discharge status: Home / Self Care | Payer: Medicare Other | Attending: Emergency Medicine | Admitting: Emergency Medicine

## 2020-07-18 ENCOUNTER — Emergency Department: Payer: Medicare Other

## 2020-07-18 DIAGNOSIS — S0990XA Unspecified injury of head, initial encounter: Secondary | ICD-10-CM | POA: Insufficient documentation

## 2020-07-18 DIAGNOSIS — M542 Cervicalgia: Secondary | ICD-10-CM | POA: Diagnosis not present

## 2020-07-18 DIAGNOSIS — W01198A Fall on same level from slipping, tripping and stumbling with subsequent striking against other object, initial encounter: Secondary | ICD-10-CM | POA: Diagnosis not present

## 2020-07-18 DIAGNOSIS — Z96651 Presence of right artificial knee joint: Secondary | ICD-10-CM | POA: Diagnosis not present

## 2020-07-18 DIAGNOSIS — W19XXXA Unspecified fall, initial encounter: Secondary | ICD-10-CM

## 2020-07-18 MED ORDER — MELOXICAM 15 MG PO TABS: 15.0000 mg | ORAL_TABLET | Freq: Every day | ORAL | 2 refills | Status: AC

## 2020-07-18 NOTE — ED Provider Notes (Signed)
[facility]-EMERGENCY DEPARTMENT  ____________________________________________  Time seen: Approximately 9:28 PM  I have reviewed the triage vital signs and the nursing notes.   HISTORY  Chief Complaint Fall and Head Injury   Historian Patient     HPI [name] is a 60 y.o. female presents to the emergency department after a mechanical fall. Patient reports that she was pumping gas and tripped. She fell along her left temple. 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 She is also complaining of neck pain. No numbness or tingling in the upper and lower extremities. She denies chest pain, chest tightness or abdominal pain.   Past Medical History:  Diagnosis Date  . Anemia   . Anxiety   . Arthritis    multiple areas  . Bone marrow disease    DX many years ago - pt unsure of name and unable to find info on type of disease  . Complication of anesthesia 2011   slow to awaken after hysterectomy  . Depression   . Dyspnea    if does not take albuterol she states she can not breath   . Family history of adverse reaction to anesthesia    pts mother has nausea and vomiting; pts sons become aggressive and have hallicunations  . Fibromyalgia   . H/O iron deficiency anemia   . Headache    MIGRAINES  . History of blood transfusion    day pt was born  . History of bronchitis   . Hx of pneumothorax    X2 IN PAST  . Kidney disease    only has one functioning kidney - no problems  x 23 yrs  . Liver failure, acute 5 years ago   cause unknown, after hysterectomy  . Neuropathy   . Numbness    MULTIPLE AREAS - "from all the surgeries I've had"  . PONV (postoperative nausea and vomiting)      Immunizations up to date:  Yes.     Past Medical History:  Diagnosis Date  . Anemia   . Anxiety   . Arthritis    multiple areas  . Bone marrow disease    DX many years ago - pt unsure of name and unable to find info on type of disease  .  Complication of anesthesia 2011   slow to awaken after hysterectomy  . Depression   . Dyspnea    if does not take albuterol she states she can not breath   . Family history of adverse reaction to anesthesia    pts mother has nausea and vomiting; pts sons become aggressive and have hallicunations  . Fibromyalgia   . H/O iron deficiency anemia   . Headache    MIGRAINES  . History of blood transfusion    day pt was born  . History of bronchitis   . Hx of pneumothorax    X2 IN PAST  . Kidney disease    only has one functioning kidney - no problems  x 23 yrs  . Liver failure, acute 5 years ago   cause unknown, after hysterectomy  . Neuropathy   . Numbness    MULTIPLE AREAS - "from all the surgeries I've had"  . PONV (postoperative nausea and vomiting)     Patient Active Problem List   Diagnosis Date Noted  . Enteritis, enteropathogenic E. coli 01/22/2020  . Enteritis 01/12/2020  . Intractable abdominal pain 01/11/2020  . Nausea & vomiting 01/11/2020  . History  of migraine 01/11/2020  . Chronic back pain 01/11/2020  . Leukocytosis 01/11/2020  . Hx of iron deficiency anemia 01/11/2020  . 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 History of total knee arthroplasty 07/30/2014  . Osteoarthritis of right knee 03/13/2014  . Tear of medial meniscus of right knee, current 03/13/2014  . Kidney disease 08/13/2010  . Anemia 08/13/2010  . SOB (shortness of breath) 08/13/2010  . Abnormal uterine bleeding 08/13/2010  . Obesity 08/13/2010  . Depression     Past Surgical History:  Procedure Laterality Date  . ABDOMINAL HYSTERECTOMY    . BACK SURGERY     multiple  . CERVICAL  SPINE SURG     SEVERAL TIMES  . CERVICAL DISCECTOMY     with fusion  . DILATION AND CURETTAGE OF UTERUS    . FOOT SURGERY  1989  . HERNIA REPAIR  2011  . KNEE ARTHROSCOPY WITH DRILLING/MICROFRACTURE Right 03/13/2014   Procedure: RIGHT MICROFRACTURE TECHNIQUE;  Surgeon: Jacki Cones, MD;  Location: [facility];   Service: Orthopedics;  Laterality: Right;  . KNEE ARTHROSCOPY WITH MEDIAL MENISECTOMY Right 03/13/2014   Procedure: RIGHT KNEE ARTHROSCOPY WITH MEDIAL FEMORAL CONDYLE REPAIR;  Surgeon: Jacki Cones, MD;  Location: [facility];  Service: Orthopedics;  Laterality: Right;  . KNEE ARTHROSCOPY WITH MEDIAL PATELLAR FEMORAL LIGAMENT RECONSTRUCTION Right 03/13/2014   Procedure: ABRASION CHONDROPLASTY OF MEDIAL FEMORAL;  Surgeon: Jacki Cones, MD;  Location: [facility];  Service: Orthopedics;  Laterality: Right;  . left knee arthroscopy    . polyp removal  2010   uterine  . right leg surgery      secondary to trauma  . SHOULDER SURGERY     Bilaterally X9 SURGERIES  . SYNOVECTOMY Right 03/13/2014   Procedure: SYNOVECTOMY OF THE SUPRAPATELLA POUCH;  Surgeon: Jacki Cones, MD;  Location: [facility];  Service: Orthopedics;  Laterality: Right;  . TONSILLECTOMY    . TOTAL KNEE ARTHROPLASTY Right 07/30/2014   Procedure: RIGHT TOTAL KNEE ARTHROPLASTY;  Surgeon: Ranee Gosselin, MD;  Location: [facility];  Service: Orthopedics;  Laterality: Right;  . TOTAL KNEE REVISION WITH SCAR DEBRIDEMENT/PATELLA REVISION WITH POLY EXCHANGE Right 04/05/2016   Procedure: OPEN SCAR DEBRIDEMENT WITH POLY EXCHANGE reviosion of patella;  Surgeon: Durene Romans, MD;  Location: [facility];  Service: Orthopedics;  Laterality: Right;    Prior to Admission medications   Medication Sig Start Date End Date Taking? Authorizing Provider  meloxicam (MOBIC) 15 MG tablet Take 1 tablet (15 mg total) by mouth daily. 07/18/20 07/18/21 Yes Pia Mau M, PA-C  albuterol (VOSPIRE ER) 4 MG 12 hr tablet Take 8 mg by mouth 2 (two) times daily.    [provider]  amitriptyline (ELAVIL) 25 MG tablet Take 25 mg by mouth at bedtime.  01/02/20   [provider]  buPROPion (WELLBUTRIN XL) 150 MG 24 hr tablet every morning. TAKE 1 TO 3 TABLETS BY MOUTH EVERY DAY    [provider]  celecoxib (CELEBREX) 200 MG capsule Take 1 capsule (200 mg  total) by mouth every 12 (twelve) hours. Take along with Pepcid to help avoid GI upset. Patient taking differently: Take 200 mg by mouth daily. Take along with Pepcid to help avoid GI upset. 04/06/16   Lanney Gins, PA-C  EPIPEN 2-PAK 0.3 MG/0.3ML SOAJ injection Inject 0.3 mg as directed as needed (allergic reaction).  02/18/16   [provider]  ibuprofen (ADVIL) 200 MG tablet Take 200 mg by mouth every 6 (six) hours  as needed.    [provider]  morphine (MSIR) 15 MG tablet Take 15 mg by mouth every 6 (six) hours as needed for severe pain.     [provider]  ondansetron (ZOFRAN ODT) 8 MG disintegrating tablet Take 1 tablet (8 mg total) by mouth every 8 (eight) hours as needed for nausea or vomiting. 01/13/20   Esaw Grandchild A, DO  ondansetron (ZOFRAN) 8 MG tablet Take 8 mg by mouth 3 (three) times daily as needed for nausea or vomiting.  02/18/16   [provider]  rOPINIRole (REQUIP) 2 MG tablet Take 1 mg by mouth at bedtime.     [provider]  Sodium Sulfate-Mag Sulfate-KCl (SUTAB) [phone] MG TABS Take 12 tablets by mouth in the morning and at bedtime. At 5pm take 12 tablets and then 5 hours prior to colonoscopy take the other 12. 02/06/20   Vanga, Loel Dubonnet, MD  topiramate (TOPAMAX) 25 MG tablet Take 25-50 mg by mouth 2 (two) times daily. Take 50mg  in the morning and 25mg s at night 01/02/16   [provider]    Allergies Fish allergy, Other, Nortriptyline, Cyclobenzaprine, Tylenol [acetaminophen], Bee venom, and Flexeril [cyclobenzaprine hcl]  Family History  Problem Relation Age of Onset  . Heart disease Mother   . Heart disease Father   . Colon cancer Father   . Heart disease Maternal Grandfather   . Heart disease Maternal Grandmother   . Heart disease Paternal Grandfather   . Heart disease Paternal Grandmother     Social History Social History   Tobacco Use  . Smoking status: Never Smoker  . Smokeless tobacco:  Never Used  Vaping Use  . Vaping Use: Never used  Substance Use Topics  . Alcohol use: No  . Drug use: No     Review of Systems  Constitutional: No fever/chills Eyes:  No discharge ENT: No upper respiratory complaints. Respiratory: no cough. No SOB/ use of accessory muscles to breath Gastrointestinal:   No nausea, no vomiting.  No diarrhea.  No constipation. Musculoskeletal: Negative for musculoskeletal pain. Neuro: Patient has headache.  Skin: Negative for rash, abrasions, lacerations, ecchymosis.    ____________________________________________   PHYSICAL EXAM:  VITAL SIGNS: ED Triage Vitals  Enc Vitals Group     BP 07/18/20 1837 (!) 154/92     Pulse Rate 07/18/20 1837 72     Resp 07/18/20 1837 20     Temp 07/18/20 1837 98.5 F (36.9 C)     Temp Source 07/18/20 1837 Oral     SpO2 07/18/20 1837 99 %     Weight 07/18/20 1838 212 lb (96.2 kg)     Height 07/18/20 1838 5\' 4"  (1.626 m)     Head Circumference --      Peak Flow --      Pain Score 07/18/20 1837 10     Pain Loc --      Pain Edu? --      Excl. in GC? --      Constitutional: Alert and oriented. Well appearing and in no acute distress. Eyes: Conjunctivae are normal. PERRL. EOMI. Head: Atraumatic.  Patient has small hematoma at left temple. ENT:      Nose: No congestion/rhinnorhea.      Mouth/Throat: Mucous membranes are moist.  Neck: No stridor.  Full range of motion.  No midline C-spine tenderness to palpation. Cardiovascular: Normal rate, regular rhythm. Normal S1 and S2.  Good peripheral circulation. Respiratory: Normal respiratory effort without tachypnea or retractions.  Lungs CTAB. Good air entry to the bases with no decreased or absent breath sounds Gastrointestinal: Bowel sounds x 4 quadrants. Soft and nontender to palpation. No guarding or rigidity. No distention. Musculoskeletal: Full range of motion to all extremities. No obvious deformities noted Neurologic:  Normal for age. No gross focal  neurologic deficits are appreciated.  Skin:  Skin is warm, dry and intact. No rash noted. Psychiatric: Mood and affect are normal for age. Speech and behavior are normal.   ____________________________________________   LABS (all labs ordered are listed, but only abnormal results are displayed)  Labs Reviewed - No data to display ____________________________________________  EKG   ____________________________________________  RADIOLOGY Geraldo Pitter, personally viewed and evaluated these images (plain radiographs) as part of my medical decision making, as well as reviewing the written report by the radiologist.  CT Head Wo Contrast  Result Date: 07/18/2020 CLINICAL DATA:  Head trauma, minor, normal mental status. Additional history provided: Fall, head injury, hitting left face, swelling noted in the region of the left eyebrow, patient reports blurred vision of left eye. EXAM: CT HEAD WITHOUT CONTRAST TECHNIQUE: Contiguous axial images were obtained from the base of the skull through the vertex without intravenous contrast. COMPARISON:  Brain MRI 11/23/2015.  Head CT 12/20/2010. FINDINGS: Brain: Cerebral volume is normal for age. There is no acute intracranial hemorrhage. No demarcated cortical infarct. No extra-axial fluid collection. No evidence of intracranial mass. No midline shift. Vascular: No hyperdense vessel. Skull: Normal. Negative for fracture or focal lesion. Sinuses/Orbits: Visualized orbits show no acute finding. No significant paranasal sinus disease at the imaged levels. Other: Left periorbital soft tissue swelling. IMPRESSION: No evidence of acute intracranial abnormality. Left periorbital soft tissue swelling. Electronically Signed   By: Jackey Loge DO   On: 07/18/2020 19:13   CT Cervical Spine Wo Contrast  Result Date: 07/18/2020 CLINICAL DATA:  Neck trauma, dangerous injury mechanism (Age 82-64y) Mechanical fall, tripped over a gas hose. EXAM: CT CERVICAL SPINE  WITHOUT CONTRAST TECHNIQUE: Multidetector CT imaging of the cervical spine was performed without intravenous contrast. Multiplanar CT image reconstructions were also generated. COMPARISON:  Cervical MRI 03/24/2016 FINDINGS: Alignment: No traumatic subluxation. Chronic straightening of normal lordosis. Trace anterolisthesis of C3 on C4. Skull base and vertebrae: C7-T1 anterior and posterior fusion. Hardware is intact. Arthrodesis C5 through C7, similar to prior MRI. There is no acute fracture. The dens and skull base are intact. Soft tissues and spinal canal: No prevertebral fluid or swelling. No visible canal hematoma. Disc levels: Disc space narrowing and anterior spurring C3-C4 and C4-C5. Diffuse disc spaces C5-C6 through C7-T1. facet hypertrophy at C4-C5. Upper chest: Nonacute. Other: None. IMPRESSION: 1. No acute fracture or subluxation of the cervical spine. 2. Postsurgical and degenerative change in the spine without hardware complication. Electronically Signed   By: Narda Rutherford M.D.   On: 07/18/2020 20:55   DG Knee Complete 4 Views Left  Result Date: 07/18/2020 CLINICAL DATA:  Fall EXAM: RIGHT KNEE - COMPLETE 4+ VIEW; LEFT KNEE - COMPLETE 4+ VIEW COMPARISON:  None. FINDINGS: Right knee: Prior total right knee arthroplasty and posterior patellar resurfacing. Hardware is in expected alignment without acute complication. Small joint effusion is suspected. Few tiny corticated joint bodies are noted, likely postsurgical in nature. Corticated fabella posteriorly. Chondroid matrix seen in the proximal fibula, without aggressive features may reflect a benign fibro-osseous lesion. No acute fracture or traumatic osseous injuries are identified. Left knee: Moderate tricompartmental degenerative changes with joint space narrowing and  essentially bone-on-bone contact in the medial compartment. Subchondral sclerotic changes and periarticular spurring are present. No acute bony abnormality. Specifically, no  fracture, subluxation, or dislocation. Mild soft tissue swelling is noted anteriorly. No soft tissue gas or foreign body. IMPRESSION: 1. Prior total right knee arthroplasty without hardware complication or other acute osseous injury. Small right knee joint effusion. 2. No acute osseous abnormality of the left knee. 3. Moderate tricompartmental degenerative changes of the left knee. 4. Mild soft tissue swelling of the left knee anteriorly. Electronically Signed   By: Kreg Shropshire M.D.   On: 07/18/2020 20:42   DG Knee Complete 4 Views Right  Result Date: 07/18/2020 CLINICAL DATA:  Fall EXAM: RIGHT KNEE - COMPLETE 4+ VIEW; LEFT KNEE - COMPLETE 4+ VIEW COMPARISON:  None. FINDINGS: Right knee: Prior total right knee arthroplasty and posterior patellar resurfacing. Hardware is in expected alignment without acute complication. Small joint effusion is suspected. Few tiny corticated joint bodies are noted, likely postsurgical in nature. Corticated fabella posteriorly. Chondroid matrix seen in the proximal fibula, without aggressive features may reflect a benign fibro-osseous lesion. No acute fracture or traumatic osseous injuries are identified. Left knee: Moderate tricompartmental degenerative changes with joint space narrowing and essentially bone-on-bone contact in the medial compartment. Subchondral sclerotic changes and periarticular spurring are present. No acute bony abnormality. Specifically, no fracture, subluxation, or dislocation. Mild soft tissue swelling is noted anteriorly. No soft tissue gas or foreign body. IMPRESSION: 1. Prior total right knee arthroplasty without hardware complication or other acute osseous injury. Small right knee joint effusion. 2. No acute osseous abnormality of the left knee. 3. Moderate tricompartmental degenerative changes of the left knee. 4. Mild soft tissue swelling of the left knee anteriorly. Electronically Signed   By: Kreg Shropshire M.D.   On: 07/18/2020 20:42   CT  Maxillofacial Wo Contrast  Result Date: 07/18/2020 CLINICAL DATA:  Mechanical fall with head injury. Tripped over gas hose. Struck left frontal area. EXAM: CT MAXILLOFACIAL WITHOUT CONTRAST TECHNIQUE: Multidetector CT imaging of the maxillofacial structures was performed. Multiplanar CT image reconstructions were also generated. COMPARISON:  Head CT earlier today. FINDINGS: Osseous: No acute fracture of the nasal bone, zygomatic arches, or mandibles. Temporomandibular joints are congruent with mild degenerative change on the right. Patient is edentulous. Minimal leftward nasal septal deviation. Orbits: No orbital fracture or globe injury. Sinuses: No sinus fracture or fluid level. No paranasal sinus mucosal thickening. Mastoid air cells are clear. Soft tissues: Left periorbital soft tissue edema. Limited intracranial: Assessed on head CT earlier today. No acute hemorrhage. IMPRESSION: Left periorbital soft tissue edema. No orbital or facial bone fracture. Electronically Signed   By: Narda Rutherford M.D.   On: 07/18/2020 20:51    ____________________________________________    PROCEDURES  Procedure(s) performed:     Procedures     Medications - No data to display   ____________________________________________   INITIAL IMPRESSION / ASSESSMENT AND PLAN / ED COURSE  Pertinent labs & imaging results that were available during my care of the patient were reviewed by me and considered in my medical decision making (see chart for details).      Assessment and plan Fall 60 year old female presents to the emergency department after she had a mechanical fall at a gas station.  Patient was hypertensive at triage but vital signs were otherwise reassuring.  CT head and max face showed no signs of intracranial bleed, skull fracture or facial fracture.  CT of the cervical spine showed no bony abnormality.  Patient was discharged with meloxicam to be taken once daily for pain and inflammation.   All patient questions were answered.     ____________________________________________  FINAL CLINICAL IMPRESSION(S) / ED DIAGNOSES  Final diagnoses:  Fall, initial encounter      NEW MEDICATIONS STARTED DURING THIS VISIT:  ED Discharge Orders         Ordered    meloxicam (MOBIC) 15 MG tablet  Daily        07/18/20 2121              This chart was dictated using voice recognition software/Dragon. Despite best efforts to proofread, errors can occur which can change the meaning. Any change was purely unintentional.     Gasper Lloyd 07/18/20 2146    Delton Prairie, MD 07/19/20 [phone]

## 2020-07-18 NOTE — ED Triage Notes (Signed)
Pt to ED via POV with c/o fall/head injury. Pt states mechanical fall after tripping over gas hose. Pt states hit L front area of her face, mild swelling noted around L eye brow. Pt states now has blurred vision out of L eye, states she feels like she felt something "pop" when she hit.

## 2020-10-28 ENCOUNTER — Ambulatory Visit (INDEPENDENT_AMBULATORY_CARE_PROVIDER_SITE_OTHER): Payer: Medicare Other | Admitting: Emergency Medicine

## 2020-10-28 ENCOUNTER — Encounter: Payer: Self-pay | Admitting: Emergency Medicine

## 2020-10-28 ENCOUNTER — Other Ambulatory Visit: Payer: Self-pay

## 2020-10-28 ENCOUNTER — Ambulatory Visit (INDEPENDENT_AMBULATORY_CARE_PROVIDER_SITE_OTHER): Payer: Medicare Other

## 2020-10-28 VITALS — BP 130/80 | HR 78 | Temp 97.7°F | Ht 64.0 in | Wt 208.8 lb

## 2020-10-28 DIAGNOSIS — Z8709 Personal history of other diseases of the respiratory system: Secondary | ICD-10-CM

## 2020-10-28 DIAGNOSIS — R0602 Shortness of breath: Secondary | ICD-10-CM | POA: Diagnosis not present

## 2020-10-28 NOTE — Assessment & Plan Note (Signed)
Shortness of breath without any clinical symptoms consistent with asthma.  She uses albuterol p.o., not clear to me what the benefit of this would be.  She is not on any bronchodilators.  We could consider repeating spirometry but she has not been COVID tested, is not vaccinated.  Its not clear to me that spirometry would add much in this situation as clinical suspicion for asthma is low.  She had spontaneous pneumothoraces in the past but this should not change her surgical risk.  She is at moderate risk for general anesthesia and surgery just based on her age, weight, overall functional capacity.  I do not see any contraindication to proceeding unless there is an acute change.  I discussed this with her.  There is no real evidence in your work-up to date to support asthma.  Unclear whether you need to continue using albuterol tablets on an as-needed basis. We will perform a chest x-ray today to ensure no evidence of recurrent pneumothorax Your age, functional capacity, and weight put you at moderately increased risk for general anesthesia and orthopedic surgery.  I do not see any evidence that your breathing is decompensated or that you have obstructive lung disease so there is no contraindication to proceeding with your surgery as long as the benefits outweigh any risk. Follow with Dr Delton Coombes if needed

## 2020-10-28 NOTE — Progress Notes (Signed)
Subjective:    Patient ID: [name], female    DOB: 1961/02/27, 60 y.o.   MRN: [id number]  HPI  60 year old never smoker with history of depression, migraines, fibromyalgia, possible asthma, spontaneous pneumothoraces, mild obstructive sleep apnea not on CPAP.  Has been evaluated for dyspnea in the past by Dr. Sherene Sires in our office.  Unclear to me that obstructive lung disease was confirmed.  She uses p.o. albuterol and has done so for many years. She uses it during the summer months and uses as needed, but is not on any BD. She believes that it helps her breathing and functional capacity. She doesn't remember when her PTX were or which side, does recall having chest tube.   Her day to day activity is fair. She will get SOB with exertion, especially when moving quickly. Has to stop after about 20 minutes. She states that she poor sleep. No cough, no wheeze. Denies any CP w  exertion.  She does snore, no witnessed apneas - not on CPAP. She does not believe her breathing has changed much over the last year.   She is not COVID 19 vaccinated.   PFT not available for review  Review of Systems As per HPI  Past Medical History:  Diagnosis Date   Anemia    Anxiety    Arthritis    multiple areas   Bone marrow disease    DX many years ago - pt unsure of name and unable to find info on type of disease   Complication of anesthesia 2011   slow to awaken after hysterectomy   Depression    Dyspnea    if does not take albuterol she states she can not breath    Family history of adverse reaction to anesthesia    pts mother has nausea and vomiting; pts sons become aggressive and have hallicunations   Fibromyalgia    H/O iron deficiency anemia    Headache    MIGRAINES   History of blood transfusion    day pt was born   History of bronchitis    Hx of pneumothorax    X2 IN PAST   Kidney disease    only has one functioning kidney - no problems  x 23 yrs   Liver failure, acute 5 years ago    cause unknown, after hysterectomy   Neuropathy    Numbness    MULTIPLE AREAS - "from all the surgeries I've had"   PONV (postoperative nausea and vomiting)      Family History  Problem Relation Age of Onset   Heart disease Mother    Heart disease Father    Colon cancer Father    Heart disease Maternal Grandfather    Heart disease Maternal Grandmother    Heart disease Paternal Grandfather    Heart disease Paternal Grandmother      Social History   Socioeconomic History   Marital status: Divorced    Spouse name: Not on file   Number of children: 2   Years of education: Not on file   Highest education level: Not on file  Occupational History   Occupation: disabled    Comment: back problems  Tobacco Use   Smoking status: Never   Smokeless tobacco: Never  Vaping Use   Vaping Use: Never used  Substance and Sexual Activity   Alcohol use: No   Drug use: No   Sexual activity: Not on file  Other Topics Concern   Not on file  Social History Narrative   Divorced and lives with her youngest son. Disabled with back problems.   Social Determinants of Health   Financial Resource Strain: Not on file  Food Insecurity: Not on file  Transportation Needs: Not on file  Physical Activity: Not on file  Stress: Not on file  Social Connections: Not on file  Intimate Partner Violence: Not on file     Allergies  Allergen Reactions   Fish Allergy Anaphylaxis    To all fish, not just shellfish.  Cannot tolerate even the smell of seafood without her airway swelling/closing.   Other Shortness Of Breath    Whipped cream and pudding   Nortriptyline Other (See Comments)    hallucinations   Cyclobenzaprine    Tylenol [Acetaminophen] Other (See Comments)    Cannot take due to past liver failure   Bee Venom Hives   Flexeril [Cyclobenzaprine Hcl] Other (See Comments)    Intense anger     Outpatient Medications Prior to Visit  Medication Sig Dispense Refill   Semaglutide (OZEMPIC,  0.25 OR 0.5 MG/DOSE, [place]) Inject 0.5 mg into the skin once a week.     albuterol (VOSPIRE ER) 4 MG 12 hr tablet Take 8 mg by mouth 2 (two) times daily.     amitriptyline (ELAVIL) 25 MG tablet Take 25 mg by mouth at bedtime.      buPROPion (WELLBUTRIN XL) 150 MG 24 hr tablet every morning. TAKE 1 TO 3 TABLETS BY MOUTH EVERY DAY     celecoxib (CELEBREX) 200 MG capsule Take 1 capsule (200 mg total) by mouth every 12 (twelve) hours. Take along with Pepcid to help avoid GI upset. (Patient taking differently: Take 200 mg by mouth daily. Take along with Pepcid to help avoid GI upset.) 60 capsule 0   EPIPEN 2-PAK 0.3 MG/0.3ML SOAJ injection Inject 0.3 mg as directed as needed (allergic reaction).      ibuprofen (ADVIL) 200 MG tablet Take 200 mg by mouth every 6 (six) hours as needed.     meloxicam (MOBIC) 15 MG tablet Take 1 tablet (15 mg total) by mouth daily. 30 tablet 2   morphine (MSIR) 15 MG tablet Take 15 mg by mouth every 6 (six) hours as needed for severe pain.      ondansetron (ZOFRAN ODT) 8 MG disintegrating tablet Take 1 tablet (8 mg total) by mouth every 8 (eight) hours as needed for nausea or vomiting. 30 tablet 1   ondansetron (ZOFRAN) 8 MG tablet Take 8 mg by mouth 3 (three) times daily as needed for nausea or vomiting.  (Patient not taking: Reported on 10/28/2020)     rOPINIRole (REQUIP) 2 MG tablet Take 1 mg by mouth at bedtime.      Sodium Sulfate-Mag Sulfate-KCl (SUTAB) [phone] MG TABS Take 12 tablets by mouth in the morning and at bedtime. At 5pm take 12 tablets and then 5 hours prior to colonoscopy take the other 12. (Patient not taking: Reported on 10/28/2020) 24 tablet 0   topiramate (TOPAMAX) 25 MG tablet Take 25-50 mg by mouth 2 (two) times daily. Take 50mg  in the morning and 25mg s at night     No facility-administered medications prior to visit.         Objective:   Physical Exam  Vitals:   10/28/20 1429  BP: 130/80  Pulse: 78  Temp: 97.7 F (36.5 C)  TempSrc:  Temporal  SpO2: 100%  Weight: 208 lb 12.8 oz (94.7 kg)  Height: 5\' 4"  (1.626 m)  Gen: Pleasant, obese shortness of well history of pneumothorax, in no distress,  normal affect  ENT: No lesions,  mouth clear,  oropharynx clear, no postnasal drip  Neck: No JVD, no stridor  Lungs: No use of accessory muscles, no crackles or wheezing on normal respiration, no wheeze on forced expiration  Cardiovascular: RRR, heart sounds normal, no murmur or gallops, no peripheral edema  Musculoskeletal: No deformities, no cyanosis or clubbing  Neuro: alert, awake, non focal  Skin: Warm, no lesions or rash     Assessment & Plan:  SOB (shortness of breath) Shortness of breath without any clinical symptoms consistent with asthma.  She uses albuterol p.o., not clear to me what the benefit of this would be.  She is not on any bronchodilators.  We could consider repeating spirometry but she has not been COVID tested, is not vaccinated.  Its not clear to me that spirometry would add much in this situation as clinical suspicion for asthma is low.  She had spontaneous pneumothoraces in the past but this should not change her surgical risk.  She is at moderate risk for general anesthesia and surgery just based on her age, weight, overall functional capacity.  I do not see any contraindication to proceeding unless there is an acute change.  I discussed this with her.  There is no real evidence in your work-up to date to support asthma.  Unclear whether you need to continue using albuterol tablets on an as-needed basis. We will perform a chest x-ray today to ensure no evidence of recurrent pneumothorax Your age, functional capacity, and weight put you at moderately increased risk for general anesthesia and orthopedic surgery.  I do not see any evidence that your breathing is decompensated or that you have obstructive lung disease so there is no contraindication to proceeding with your surgery as long as the benefits  outweigh any risk. Follow with Dr Delton Coombes if needed   Levy Pupa, MD, PhD 10/28/2020, 3:10 PM [facility] [phone] or if no answer before 7:00PM call [phone] For any issues after 7:00PM please call eLink [phone]

## 2020-10-28 NOTE — Patient Instructions (Signed)
There is no real evidence in your work-up to date to support asthma.  Unclear whether you need to continue using albuterol tablets on an as-needed basis. We will perform a chest x-ray today to ensure no evidence of recurrent pneumothorax Your age, functional capacity, and weight put you at moderately increased risk for general anesthesia and orthopedic surgery.  I do not see any evidence that your breathing is decompensated or that you have obstructive lung disease so there is no contraindication to proceeding with your surgery as long as the benefits outweigh any risk. Follow with Dr Delton Coombes if needed

## 2020-11-20 ENCOUNTER — Encounter (HOSPITAL_COMMUNITY): Admission: RE | Admit: 2020-11-20 | Payer: Medicare Other | Source: Ambulatory Visit

## 2020-12-06 IMAGING — US US ABDOMEN LIMITED
1 series · 14 of 25 positions shown · non-contrast
Comparison: Abdomen and pelvis CT dated 02/21/2017.

CLINICAL DATA: Right upper quadrant abdominal pain for the past 2
weeks.

EXAM:
ULTRASOUND ABDOMEN LIMITED RIGHT UPPER QUADRANT

[Series 1: us abdomen limited · 0.23mm/px · 14 of 36 slices shown]
[im 1/36]
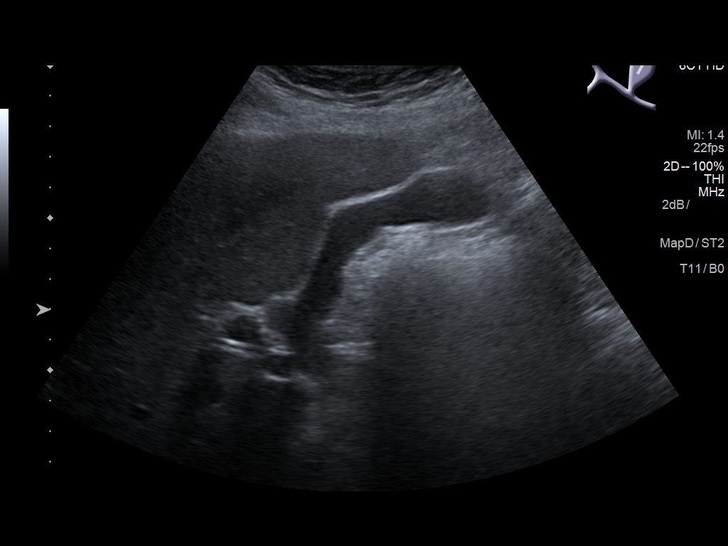
[im 3/36]
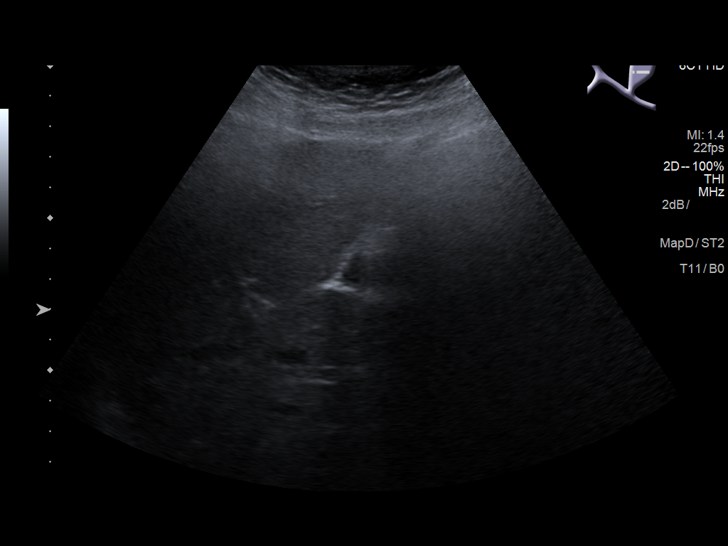
[im 6/36]
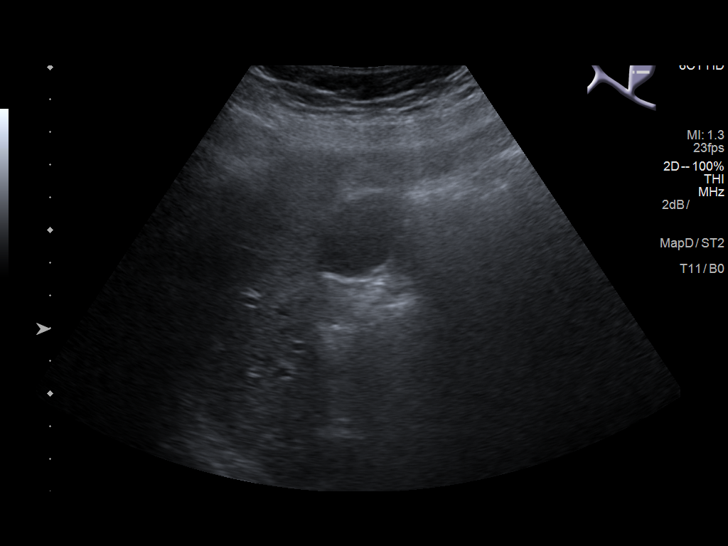
[im 9/36]
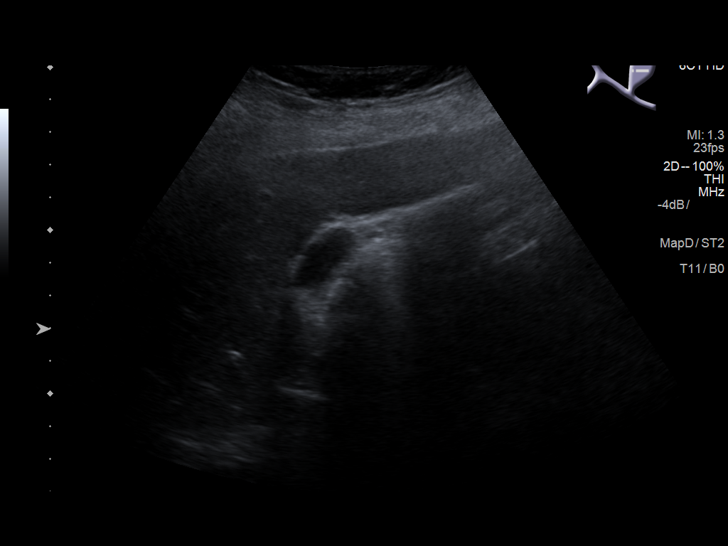
[im 12/36]
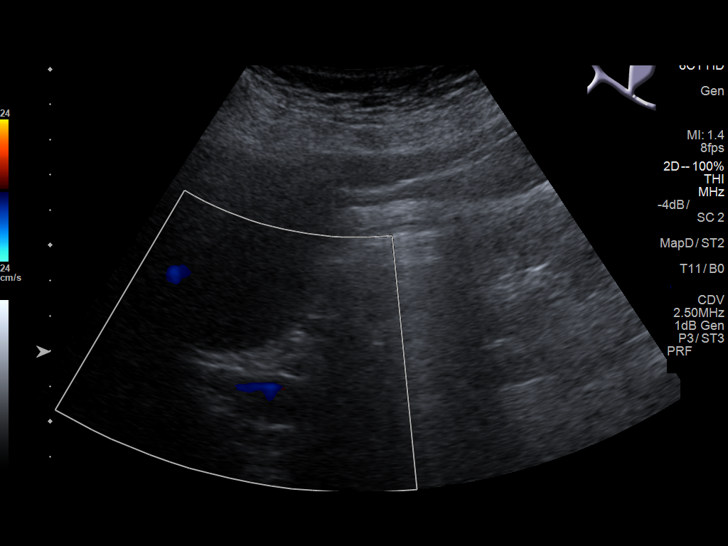
[im 14/36]
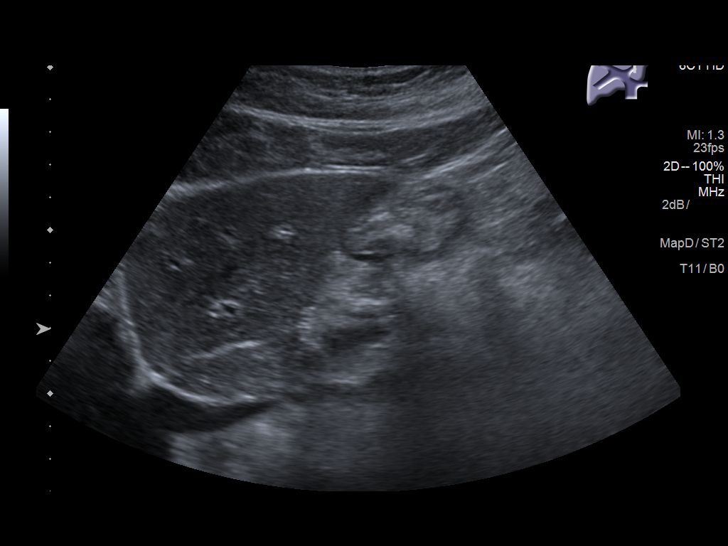
[im 17/36]
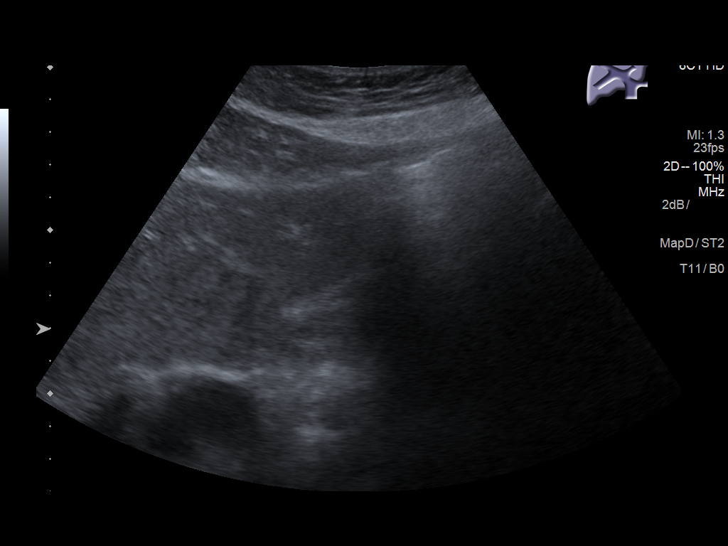
[im 19/36]
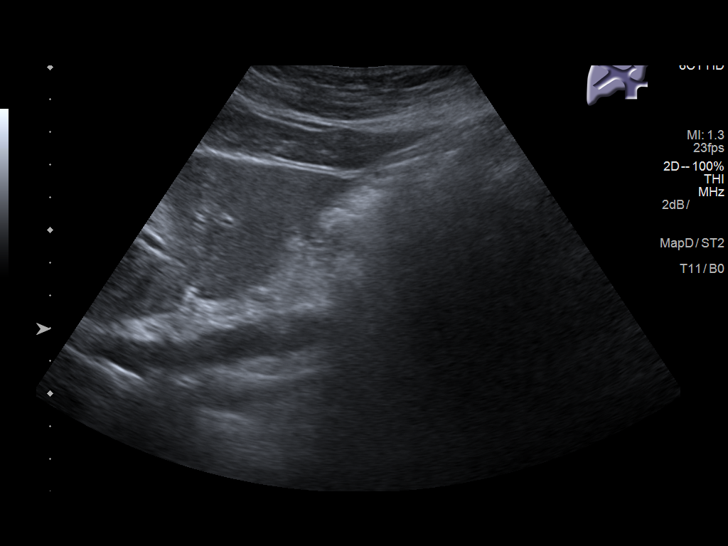
[im 22/36]
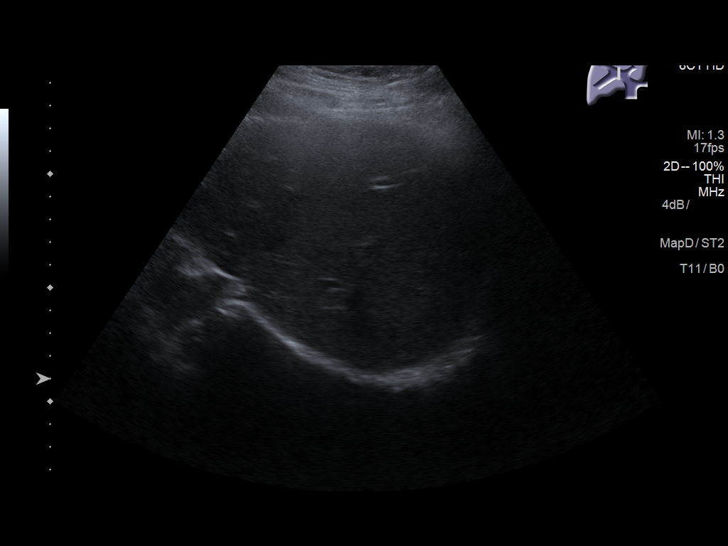
[im 24/36]
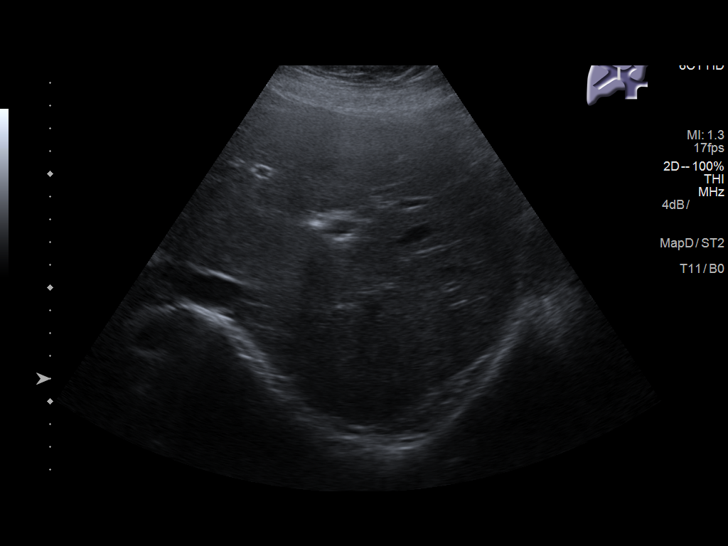
[im 27/36]
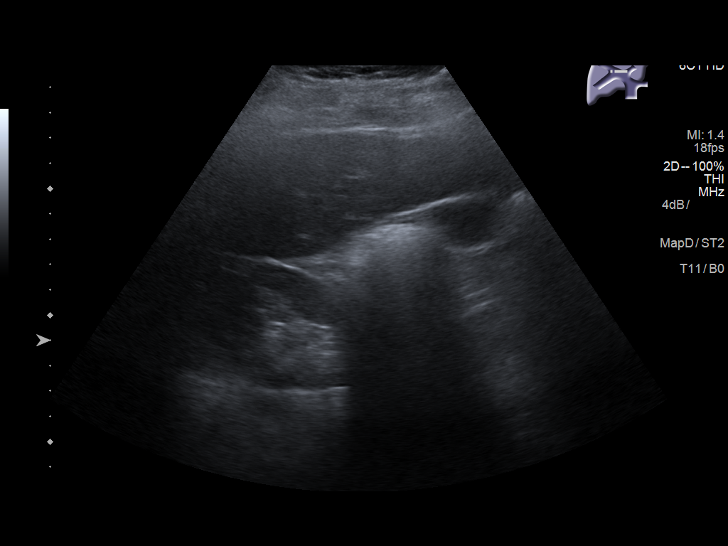
[im 30/36]
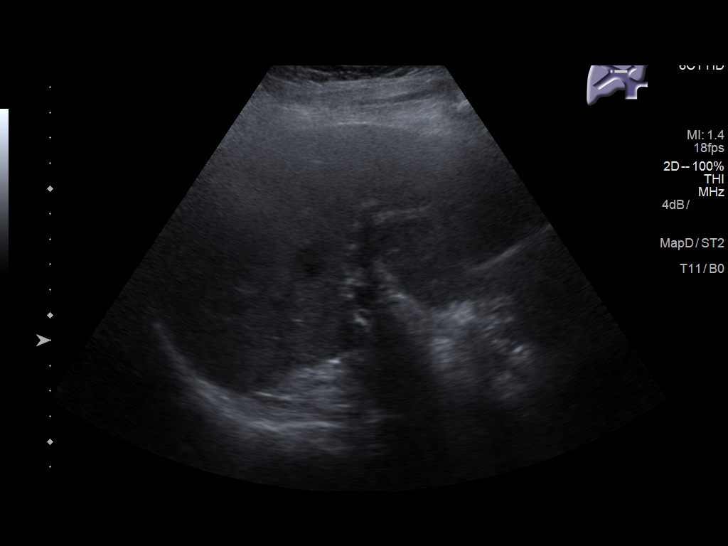
[im 33/36]
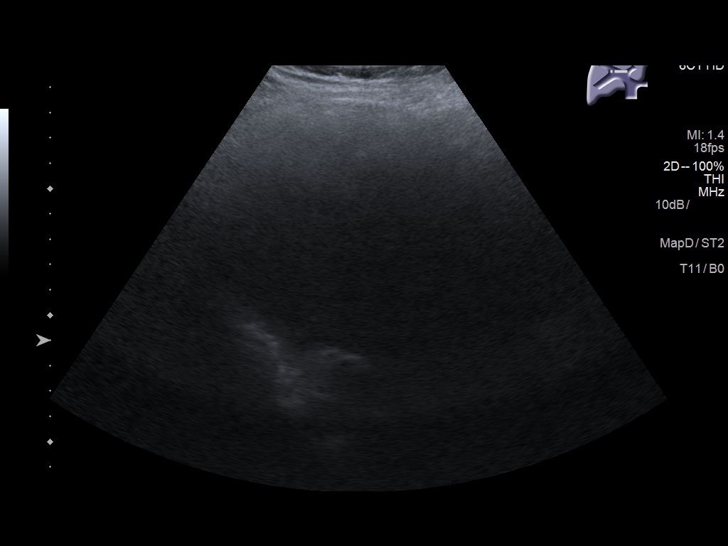
[im 36/36]
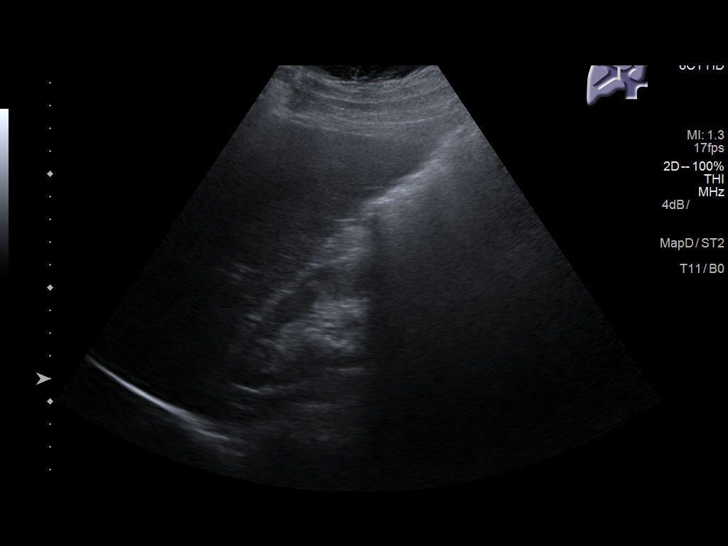

[14 of 25 positions shown; findings below may reference images not displayed]

FINDINGS: Gallbladder:

No gallstones or wall thickening visualized. No sonographic Murphy
sign noted by sonographer.

Common bile duct:

Diameter: 3.3 mm

Liver:

No focal lesion identified. Within normal limits in parenchymal
echogenicity. Portal vein is patent on color Doppler imaging with
normal direction of blood flow towards the liver.
IMPRESSION: Normal examination.

## 2021-01-15 ENCOUNTER — Other Ambulatory Visit (HOSPITAL_COMMUNITY): Payer: Medicare Other

## 2021-02-16 ENCOUNTER — Other Ambulatory Visit (HOSPITAL_COMMUNITY): Payer: Medicare Other

## 2021-02-23 ENCOUNTER — Ambulatory Visit: Admit: 2021-02-23 | Payer: Medicare Other | Admitting: Orthopedic Surgery

## 2021-02-23 SURGERY — ARTHROPLASTY, KNEE, TOTAL
Anesthesia: Choice | Site: Knee | Laterality: Left

## 2021-05-22 ENCOUNTER — Other Ambulatory Visit (HOSPITAL_COMMUNITY): Payer: Medicare Other

## 2021-06-01 ENCOUNTER — Ambulatory Visit: Admit: 2021-06-01 | Payer: Medicare Other | Admitting: Orthopedic Surgery

## 2021-06-01 DIAGNOSIS — M1712 Unilateral primary osteoarthritis, left knee: Secondary | ICD-10-CM

## 2021-06-01 SURGERY — ARTHROPLASTY, KNEE, TOTAL
Anesthesia: Choice | Site: Knee | Laterality: Left

## 2022-09-21 ENCOUNTER — Emergency Department
Admission: EM | Admit: 2022-09-21 | Discharge: 2022-09-21 | Disposition: A | Discharge status: Home / Self Care | Payer: 59 | Attending: Emergency Medicine | Admitting: Emergency Medicine

## 2022-09-21 ENCOUNTER — Encounter: Payer: Self-pay | Admitting: *Deleted

## 2022-09-21 ENCOUNTER — Emergency Department: Payer: 59

## 2022-09-21 ENCOUNTER — Other Ambulatory Visit: Payer: Self-pay

## 2022-09-21 DIAGNOSIS — R110 Nausea: Secondary | ICD-10-CM | POA: Insufficient documentation

## 2022-09-21 DIAGNOSIS — R8271 Bacteriuria: Secondary | ICD-10-CM | POA: Insufficient documentation

## 2022-09-21 DIAGNOSIS — R109 Unspecified abdominal pain: Secondary | ICD-10-CM | POA: Insufficient documentation

## 2022-09-21 DIAGNOSIS — M545 Low back pain, unspecified: Secondary | ICD-10-CM

## 2022-09-21 DIAGNOSIS — R350 Frequency of micturition: Secondary | ICD-10-CM | POA: Diagnosis not present

## 2022-09-21 DIAGNOSIS — N390 Urinary tract infection, site not specified: Secondary | ICD-10-CM

## 2022-09-21 LAB — URINALYSIS, ROUTINE W REFLEX MICROSCOPIC
Bilirubin Urine: NEGATIVE
Glucose, UA: NEGATIVE mg/dL
Hgb urine dipstick: NEGATIVE
Ketones, ur: NEGATIVE mg/dL
Nitrite: NEGATIVE
Protein, ur: NEGATIVE mg/dL
Specific Gravity, Urine: 1.018 (ref 1.005–1.030)
WBC, UA: >50 WBC/hpf (ref 0–5)
pH: 6 (ref 5.0–8.0)

## 2022-09-21 LAB — BASIC METABOLIC PANEL
Anion gap: 9 (ref 5–15)
BUN: 15 mg/dL (ref 6–20)
CO2: 26 mmol/L (ref 22–32)
Calcium: 9.1 mg/dL (ref 8.9–10.3)
Chloride: 105 mmol/L (ref 98–111)
Creatinine, Ser: 0.92 mg/dL (ref 0.44–1.00)
GFR, Estimated: >60 mL/min (ref >60)
Glucose, Bld: 83 mg/dL (ref 70–99)
Potassium: 4.2 mmol/L (ref 3.5–5.1)
Sodium: 140 mmol/L (ref 135–145)

## 2022-09-21 LAB — CBC
HCT: 40.3 % (ref 36.0–46.0)
Hemoglobin: 12.2 g/dL (ref 12.0–15.0)
MCH: 28 pg (ref 26.0–34.0)
MCHC: 30.3 g/dL (ref 30.0–36.0)
MCV: 92.6 fL (ref 80.0–100.0)
Platelets: 293 10*3/uL (ref 150–400)
RBC: 4.35 MIL/uL (ref 3.87–5.11)
RDW: 12.8 % (ref 11.5–15.5)
WBC: 6.8 10*3/uL (ref 4.0–10.5)
nRBC: 0 % (ref 0.0–0.2)

## 2022-09-21 MED ORDER — LEVOFLOXACIN 750 MG PO TABS: 750.0000 mg | ORAL_TABLET | Freq: Every day | ORAL | 0 refills | Status: AC

## 2022-09-21 MED ORDER — ONDANSETRON HCL 4 MG PO TABS
4.0000 mg | ORAL_TABLET | Freq: Once | ORAL | Status: AC
  Administered 2022-09-21: 4 mg via ORAL

## 2022-09-21 NOTE — ED Provider Notes (Signed)
[facility] Emergency Department Provider Note     Event Date/Time   First MD Initiated Contact with Patient 09/21/22 2004     (approximate)   History   Back Pain   HPI  [name] is a 62 y.o. female with a past medical history of kidney disease and chronic back pain who presents to the emergency department with complaint of intermittent lower back pain for 2 days.  She reports the pain is localized to her right side and feels sharp.  No radiation.  Severity level is moderate.  Associated symptoms include urinary frequency and nausea.  She has tried heating pads, and morphine prescription with no relief.  Patient reports she has never felt a pain like this before.  She denies fever, dysuria, weak urinary stream, hematuria, bowel changes.      Physical Exam   Triage Vital Signs: ED Triage Vitals  Enc Vitals Group     BP 09/21/22 1933 137/83     Pulse Rate 09/21/22 1933 79     Resp 09/21/22 1933 18     Temp 09/21/22 1933 98.1 F (36.7 C)     Temp Source 09/21/22 1933 Oral     SpO2 09/21/22 1933 99 %     Weight 09/21/22 1931 172 lb (78 kg)     Height 09/21/22 1931 5\' 4"  (1.626 m)     Head Circumference --      Peak Flow --      Pain Score 09/21/22 1930 10     Pain Loc --      Pain Edu? --      Excl. in GC? --     Most recent vital signs: Vitals:   09/21/22 1933  BP: 137/83  Pulse: 79  Resp: 18  Temp: 98.1 F (36.7 C)  SpO2: 99%   General: Pleasant.  Nontoxic.  Alert and oriented. INAD.     Head:  NCAT.  Eyes:  PERRLA. EOMI. Conjunctivae clear. Neck:   Supple. No cervical spine tenderness to palpation. No lymphadenopathy.  CV:  Good peripheral perfusion. RRR.  RESP:  Normal effort. LCTAB. No retractions.  ABD:  No distention. NBSx4. Soft. Diffuse tenderness to palpation.  Negative Murphy sign.  CVA tenderness to right side. BACK:  Spinous process is midline without deformity or tenderness.  MSK:   Patient freely moves all  extremities. No warm swollen joints. No peripheral edema.   NEURO: Sensation and motor function intact.  Steady gait.   ED Results / Procedures / Treatments   Labs (all labs ordered are listed, but only abnormal results are displayed) Labs Reviewed  URINALYSIS, ROUTINE W REFLEX MICROSCOPIC - Abnormal; Notable for the following components:      Result Value   Color, Urine YELLOW (*)    APPearance CLOUDY (*)    Leukocytes,Ua LARGE (*)    Bacteria, UA RARE (*)    All other components within normal limits  CBC  BASIC METABOLIC PANEL   RADIOLOGY  I personally viewed and evaluated these images as part of my medical decision making, as well as reviewing the written report by the radiologist.  ED Provider Interpretation: CT abdomen and pelvis appears normal.  Amount of stool in colon noted.  CT ABDOMEN PELVIS WO CONTRAST  Result Date: 09/21/2022 CLINICAL DATA:  Abdominal and flank pain. EXAM: CT ABDOMEN AND PELVIS WITHOUT CONTRAST TECHNIQUE: Multidetector CT imaging of the abdomen and pelvis was performed following the standard protocol without IV contrast.  RADIATION DOSE REDUCTION: This exam was performed according to the departmental dose-optimization program which includes automated exposure control, adjustment of the mA and/or kV according to patient size and/or use of iterative reconstruction technique. COMPARISON:  CT abdomen and pelvis 01/11/2020 FINDINGS: Lower chest: There are minimal ground-glass opacities in the lung bases. Hepatobiliary: No focal liver abnormality is seen. No gallstones, gallbladder wall thickening, or biliary dilatation. Pancreas: Unremarkable. No pancreatic ductal dilatation or surrounding inflammatory changes. Spleen: Normal in size without focal abnormality. Adrenals/Urinary Tract: Adrenal glands are unremarkable. Kidneys are normal, without renal calculi, focal lesion, or hydronephrosis. Bladder is unremarkable. Stomach/Bowel: Stomach is within normal limits.  Appendix appears normal. No evidence of bowel wall thickening, distention, or inflammatory changes. There are scattered colonic diverticula. There is a large amount of stool throughout the colon. Vascular/Lymphatic: No significant vascular findings are present. No enlarged abdominal or pelvic lymph nodes. Reproductive: Status post hysterectomy. No adnexal masses. Other: No abdominal wall hernia or abnormality. No abdominopelvic ascites. Musculoskeletal: L2-S1 posterior fusion hardware is present. IMPRESSION: 1. No acute localizing process in the abdomen or pelvis. 2. Colonic diverticulosis without evidence for diverticulitis. 3. Large amount of stool throughout the colon. Electronically Signed   By: Darliss Cheney M.D.   On: 09/21/2022 21:18     PROCEDURES:  Critical Care performed: No  Procedures   MEDICATIONS ORDERED IN ED: Medications  ondansetron (ZOFRAN) tablet 4 mg (4 mg Oral Given 09/21/22 2311)     IMPRESSION / MDM / ASSESSMENT AND PLAN / ED COURSE  I reviewed the triage vital signs and the nursing notes.                              Differential diagnosis includes, but is not limited to, nephrolithiasis, kidney infection, cystitis, appendicitis, cholelithiasis.   Patient's presentation is most consistent with acute complicated illness / injury requiring diagnostic workup.  Assessment:  62 year old female presents to the ED with acute lower back pain with associated symptoms of nausea and urinary frequency.  See HPI.  Vital signs are stable.    Patient is given Zofran for nausea.  She refuses any pain medications, stating she has a high tolerance and would prefer not to take any.  CT abdomen pelvis without contrast reveals no acute findings, or nephrolithiasis which was high on my differential diagnosis per examination and history intake. Laboratory studies including BMP, CBC are all within normal limits.  Urinalysis detects large amount of leukocytes and bacteria consistent with  urinary infection, unspecified location.  Overall her benign physical exam is reassuring to discharge and follow-up outpatient for further evaluation or management if needed.  Patient will be discharged home with a prescription for levofloxacin. Patient is to follow up with primary care in 3 to 7 days if symptoms do not improve following antibiotic course. Patient is given ED precautions to return to the ED for any worsening or new symptoms.  Patient agrees and verbalizes understanding with assessment and treatment plan  Clinical Course as of 09/22/22 0107  Tue Sep 21, 2022  2224 CT ABDOMEN PELVIS WO CONTRAST [MH]    Clinical Course User Index [MH] Romeo Apple, Rema Lievanos A, PA-C    FINAL CLINICAL IMPRESSION(S) / ED DIAGNOSES   Final diagnoses:  Acute right-sided low back pain without sciatica  Bacteria in urine     Rx / DC Orders   ED Discharge Orders          Ordered  levofloxacin (LEVAQUIN) 750 MG tablet  Daily        09/21/22 2256             Note:  This document was prepared using Dragon voice recognition software and may include unintentional dictation errors.    Romeo Apple, Yeshaya Vath A, PA-C 09/22/22 0118    Minna Antis, MD 09/23/22 [phone]

## 2022-09-21 NOTE — ED Triage Notes (Addendum)
Pt ambulatory to triage.  Pt has lower back pain. No known injury.  Pt reports urinary frequency.  Pt also has nausea, pt took a zofran at 1800.  Pt alert

## 2022-09-21 NOTE — Discharge Instructions (Addendum)
Being treated in the ER is only one step in your treatment and safety! Even if you feel better, you still need to go to all suggested follow-up appointments and take medications as directed. This will help you recover and prevent future problems. Make sure to make and go to all appointments, and call your doctor if things are not going as expected.   If you have been prescribed antibiotics, take them exactly as directed. Do not stop taking them because your symptoms have improved. If you don't finish the entire antibiotic course, you can breed resistant infections which are harder to treat. Take over-the-counter Tylenol or Ibuprofen with food or snacks as needed for pain.Take care and be patient!

## 2022-09-21 NOTE — ED Provider Notes (Incomplete)
    [facility] Emergency Department Provider Note     Event Date/Time   First MD Initiated Contact with Patient 09/21/22 2004     (approximate)   History   Back Pain   HPI  [name] is a 62 y.o. female with a past medical history of kidney disease and chronic back pain emergency department with complaint of lower back pain localized to the right side.  Onset of pain 2 days.  Associated symptoms include urine frequency, nausea.    Physical Exam   Triage Vital Signs: ED Triage Vitals  Enc Vitals Group     BP 09/21/22 1933 137/83     Pulse Rate 09/21/22 1933 79     Resp 09/21/22 1933 18     Temp 09/21/22 1933 98.1 F (36.7 C)     Temp Source 09/21/22 1933 Oral     SpO2 09/21/22 1933 99 %     Weight 09/21/22 1931 172 lb (78 kg)     Height 09/21/22 1931 5\' 4"  (1.626 m)     Head Circumference --      Peak Flow --      Pain Score 09/21/22 1930 10     Pain Loc --      Pain Edu? --      Excl. in GC? --     Most recent vital signs: Vitals:   09/21/22 1933  BP: 137/83  Pulse: 79  Resp: 18  Temp: 98.1 F (36.7 C)  SpO2: 99%    General Awake, no distress. *** {**HEENT NCAT. PERRL. EOMI. No rhinorrhea. Mucous membranes are moist. **} CV:  Good peripheral perfusion. *** RESP:  Normal effort. *** ABD:  No distention. *** {**Other: **}   ED Results / Procedures / Treatments   Labs (all labs ordered are listed, but only abnormal results are displayed) Labs Reviewed  URINALYSIS, ROUTINE W REFLEX MICROSCOPIC     EKG  ***  RADIOLOGY  {**I personally viewed and evaluated these images as part of my medical decision making, as well as reviewing the written report by the radiologist.  ED Provider Interpretation: ***  No results found.   PROCEDURES:  Critical Care performed: {CriticalCareYesNo:19197::"Yes, see critical care procedure note(s)","No"}  Procedures   MEDICATIONS ORDERED IN ED: Medications - No data to  display   IMPRESSION / MDM / ASSESSMENT AND PLAN / ED COURSE  I reviewed the triage vital signs and the nursing notes.                              Differential diagnosis includes, but is not limited to, ***  Patient's presentation is most consistent with {EM COPA:27473}  {**The patient is on the cardiac monitor to evaluate for evidence of arrhythmia and/or significant heart rate changes.**}  Patient's diagnosis is consistent with ***. Patient will be discharged home with prescriptions for ***. Patient is to follow up with *** as needed or otherwise directed. Patient is given ED precautions to return to the ED for any worsening or new symptoms.     FINAL CLINICAL IMPRESSION(S) / ED DIAGNOSES   Final diagnoses:  None     Rx / DC Orders   ED Discharge Orders     None        Note:  This document was prepared using Dragon voice recognition software and may include unintentional dictation errors.

## 2023-01-18 NOTE — H&P (Signed)
TOTAL KNEE ADMISSION H&P  Patient is being admitted for left total knee arthroplasty.  Subjective:  Chief Complaint: Left knee pain.  HPI: [name], 62 y.o. female has a history of pain and functional disability in the left knee due to arthritis and has failed non-surgical conservative treatments for greater than 12 weeks to include NSAID's and/or analgesics, corticosteriod injections, viscosupplementation injections, and activity modification. Onset of symptoms was gradual, starting several years ago with gradually worsening course since that time. The patient noted no past surgery on the left knee.  Patient currently rates pain in the left knee at 9 out of 10 with activity. Patient has night pain, worsening of pain with activity and weight bearing, pain that interferes with activities of daily living, and pain with passive range of motion. Patient has evidence of  bone-on-bone arthritis in the medial compartment of the left knee with near bone-on-bone patellofemoral. There is also slight tibial subluxation  by imaging studies. There is no active infection.  Patient Active Problem List   Diagnosis Date Noted   Enteritis, enteropathogenic E. coli 01/22/2020   Enteritis 01/12/2020   Intractable abdominal pain 01/11/2020   Nausea & vomiting 01/11/2020   History of migraine 01/11/2020   Chronic back pain 01/11/2020   Leukocytosis 01/11/2020   Hx of iron deficiency anemia 01/11/2020   S/P revision of right TK 04/05/2016   History of total knee arthroplasty 07/30/2014   Osteoarthritis of right knee 03/13/2014   Tear of medial meniscus of right knee, current 03/13/2014   Kidney disease 08/13/2010   Anemia 08/13/2010   SOB (shortness of breath) 08/13/2010   Abnormal uterine bleeding 08/13/2010   Obesity 08/13/2010   Depression     Past Medical History:  Diagnosis Date   Anemia    Anxiety    Arthritis    multiple areas   Bone marrow disease    DX many years ago - pt unsure of  name and unable to find info on type of disease   Complication of anesthesia 2011   slow to awaken after hysterectomy   Depression    Dyspnea    if does not take albuterol she states she can not breath    Family history of adverse reaction to anesthesia    pts mother has nausea and vomiting; pts sons become aggressive and have hallicunations   Fibromyalgia    H/O iron deficiency anemia    Headache    MIGRAINES   History of blood transfusion    day pt was born   History of bronchitis    Hx of pneumothorax    X2 IN PAST   Kidney disease    only has one functioning kidney - no problems  x 23 yrs   Liver failure, acute 5 years ago   cause unknown, after hysterectomy   Neuropathy    Numbness    MULTIPLE AREAS - "from all the surgeries I've had"   PONV (postoperative nausea and vomiting)     Past Surgical History:  Procedure Laterality Date   ABDOMINAL HYSTERECTOMY     BACK SURGERY     multiple   CERVICAL  SPINE SURG     SEVERAL TIMES   CERVICAL DISCECTOMY     with fusion   DILATION AND CURETTAGE OF UTERUS     FOOT SURGERY  1989   HERNIA REPAIR  2011   KNEE ARTHROSCOPY WITH DRILLING/MICROFRACTURE Right 03/13/2014   Procedure: RIGHT MICROFRACTURE TECHNIQUE;  Surgeon: Jacki Cones, MD;  Location: [facility];  Service: Orthopedics;  Laterality: Right;   KNEE ARTHROSCOPY WITH MEDIAL MENISECTOMY Right 03/13/2014   Procedure: RIGHT KNEE ARTHROSCOPY WITH MEDIAL FEMORAL CONDYLE REPAIR;  Surgeon: Jacki Cones, MD;  Location: [facility];  Service: Orthopedics;  Laterality: Right;   KNEE ARTHROSCOPY WITH MEDIAL PATELLAR FEMORAL LIGAMENT RECONSTRUCTION Right 03/13/2014   Procedure: ABRASION CHONDROPLASTY OF MEDIAL FEMORAL;  Surgeon: Jacki Cones, MD;  Location: [facility];  Service: Orthopedics;  Laterality: Right;   left knee arthroscopy     polyp removal  2010   uterine   right leg surgery      secondary to trauma   SHOULDER SURGERY     Bilaterally X9 SURGERIES   SYNOVECTOMY Right  03/13/2014   Procedure: SYNOVECTOMY OF THE SUPRAPATELLA POUCH;  Surgeon: Jacki Cones, MD;  Location: [facility];  Service: Orthopedics;  Laterality: Right;   TONSILLECTOMY     TOTAL KNEE ARTHROPLASTY Right 07/30/2014   Procedure: RIGHT TOTAL KNEE ARTHROPLASTY;  Surgeon: Ranee Gosselin, MD;  Location: [facility];  Service: Orthopedics;  Laterality: Right;   TOTAL KNEE REVISION WITH SCAR DEBRIDEMENT/PATELLA REVISION WITH POLY EXCHANGE Right 04/05/2016   Procedure: OPEN SCAR DEBRIDEMENT WITH POLY EXCHANGE reviosion of patella;  Surgeon: Durene Romans, MD;  Location: [facility];  Service: Orthopedics;  Laterality: Right;    Prior to Admission medications   Medication Sig Start Date End Date Taking? Authorizing Provider  albuterol (VOSPIRE ER) 4 MG 12 hr tablet Take 8 mg by mouth 2 (two) times daily.    [provider]  amitriptyline (ELAVIL) 25 MG tablet Take 25 mg by mouth at bedtime.  01/02/20   [provider]  buPROPion (WELLBUTRIN XL) 150 MG 24 hr tablet every morning. TAKE 1 TO 3 TABLETS BY MOUTH EVERY DAY    [provider]  celecoxib (CELEBREX) 200 MG capsule Take 1 capsule (200 mg total) by mouth every 12 (twelve) hours. Take along with Pepcid to help avoid GI upset. Patient taking differently: Take 200 mg by mouth daily. Take along with Pepcid to help avoid GI upset. 04/06/16   Lanney Gins, PA-C  EPIPEN 2-PAK 0.3 MG/0.3ML SOAJ injection Inject 0.3 mg as directed as needed (allergic reaction).  02/18/16   [provider]  ibuprofen (ADVIL) 200 MG tablet Take 200 mg by mouth every 6 (six) hours as needed.    [provider]  morphine (MSIR) 15 MG tablet Take 15 mg by mouth every 6 (six) hours as needed for severe pain.     [provider]  ondansetron (ZOFRAN ODT) 8 MG disintegrating tablet Take 1 tablet (8 mg total) by mouth every 8 (eight) hours as needed for nausea or vomiting. 01/13/20   Esaw Grandchild A, DO  ondansetron (ZOFRAN) 8 MG tablet  Take 8 mg by mouth 3 (three) times daily as needed for nausea or vomiting.  Patient not taking: Reported on 10/28/2020 02/18/16   [provider]  rOPINIRole (REQUIP) 2 MG tablet Take 1 mg by mouth at bedtime.     [provider]  Semaglutide (OZEMPIC, 0.25 OR 0.5 MG/DOSE, [place]) Inject 0.5 mg into the skin once a week.    [provider]  Sodium Sulfate-Mag Sulfate-KCl (SUTAB) [phone] MG TABS Take 12 tablets by mouth in the morning and at bedtime. At 5pm take 12 tablets and then 5 hours prior to colonoscopy take the other 12. Patient not taking: Reported on 10/28/2020 02/06/20   Toney Reil, MD  topiramate (TOPAMAX)  25 MG tablet Take 25-50 mg by mouth 2 (two) times daily. Take 50mg  in the morning and 25mg s at night 01/02/16   [provider]    Allergies  Allergen Reactions   Fish Allergy Anaphylaxis    To all fish, not just shellfish.  Cannot tolerate even the smell of seafood without her airway swelling/closing.   Other Shortness Of Breath    Whipped cream and pudding   Nortriptyline Other (See Comments)    hallucinations   Cyclobenzaprine    Tylenol [Acetaminophen] Other (See Comments)    Cannot take due to past liver failure   Bee Venom Hives   Flexeril [Cyclobenzaprine Hcl] Other (See Comments)    Intense anger    Social History   Socioeconomic History   Marital status: Divorced    Spouse name: Not on file   Number of children: 2   Years of education: Not on file   Highest education level: Not on file  Occupational History   Occupation: disabled    Comment: back problems  Tobacco Use   Smoking status: Never   Smokeless tobacco: Never  Vaping Use   Vaping status: Never Used  Substance and Sexual Activity   Alcohol use: No   Drug use: No   Sexual activity: Not on file  Other Topics Concern   Not on file  Social History Narrative   Divorced and lives with her youngest son. Disabled with back problems.   Social Determinants  of Health   Financial Resource Strain: Not on file  Food Insecurity: Not on file  Transportation Needs: Not on file  Physical Activity: Not on file  Stress: Not on file  Social Connections: Not on file  Intimate Partner Violence: Not on file    Tobacco Use: Low Risk  (09/21/2022)   Patient History    Smoking Tobacco Use: Never    Smokeless Tobacco Use: Never    Passive Exposure: Not on file   Social History   Substance and Sexual Activity  Alcohol Use No    Family History  Problem Relation Age of Onset   Heart disease Mother    Heart disease Father    Colon cancer Father    Heart disease Maternal Grandfather    Heart disease Maternal Grandmother    Heart disease Paternal Grandfather    Heart disease Paternal Grandmother     ROS  Objective:  Physical Exam: Well nourished and well developed.  General: Alert and oriented x3, cooperative and pleasant, no acute distress.  Head: normocephalic, atraumatic, neck supple.  Eyes: EOMI. Abdomen: non-tender to palpation and soft, normoactive bowel sounds. Musculoskeletal: - The left knee shows no effusion.  - Range of motion is 0-115 with moderate crepitus on range of motion.  - She has significant tenderness medially.  - There is slight lateral tenderness.  - No instability noted.  - Gait pattern is antalgic on the left.  Calves soft and nontender. Motor function intact in LE. Strength 5/5 LE bilaterally. Neuro: Distal pulses 2+. Sensation to light touch intact in LE.  Vital signs in last 24 hours: BP: ()/()  Arterial Line BP: ()/()   Imaging Review Plain radiographs demonstrate severe degenerative joint disease of the left knee. The overall alignment is neutral. The bone quality appears to be adequate for age and reported activity level.  Assessment/Plan:  End stage arthritis, left knee   The patient history, physical examination, clinical judgment of the provider and imaging studies are consistent with end stage  degenerative joint disease of the left knee and total knee arthroplasty is deemed medically necessary. The treatment options including medical management, injection therapy arthroscopy and arthroplasty were discussed at length. The risks and benefits of total knee arthroplasty were presented and reviewed. The risks due to aseptic loosening, infection, stiffness, patella tracking problems, thromboembolic complications and other imponderables were discussed. The patient acknowledged the explanation, agreed to proceed with the plan and consent was signed. Patient is being admitted for inpatient treatment for surgery, pain control, PT, OT, prophylactic antibiotics, VTE prophylaxis, progressive ambulation and ADLs and discharge planning. The patient is planning to be discharged  home .  Patient's anticipated LOS is less than 2 midnights, meeting these requirements: - Younger than 51 - Lives within 1 hour of care - Has a competent adult at home to recover with post-op - NO history of  - Coronary Artery Disease  - Heart failure  - Heart attack  - Stroke  - DVT/VTE  - Cardiac arrhythmia  - Respiratory Failure/COPD  - Renal failure  - Anemia  - Advanced Liver disease  Therapy Plans: Roseanne Reno PT [facility]) Disposition: Home with Friend Planned DVT Prophylaxis: Aspirin 81 mg BID DME Needed: RW PCP: Lance Bosch, NP (clearance received) TXA: IV Allergies: flexeril (violent), nortriptyline (hallucinations) Anesthesia Concerns: uses CPAP BMI: 32.3 Last HgbA1c: 6.0%  Pharmacy: [facility]  Other: -chronic morphine 15 mg TID PRN - discussed dilaudid for breakthrough pain and tizanidine for muscle relaxer (states methocarbamol does not work and body has adapted)  - Patient was instructed on what medications to stop prior to surgery. - Follow-up visit in 2 weeks with Dr. Lequita Halt - Begin physical therapy following surgery - Pre-operative lab work as pre-surgical testing - Prescriptions will  be provided in hospital at time of discharge  R. Arcola Jansky, PA-C Orthopedic Surgery [facility] [place]

## 2023-02-02 NOTE — Patient Instructions (Signed)
DUE TO COVID-19 ONLY TWO VISITORS  (aged 61 and older)  ARE ALLOWED TO COME WITH YOU AND STAY IN THE WAITING ROOM ONLY DURING PRE OP AND PROCEDURE.   **NO VISITORS ARE ALLOWED IN THE SHORT STAY AREA OR RECOVERY ROOM!!**  IF YOU WILL BE ADMITTED INTO THE HOSPITAL YOU ARE ALLOWED ONLY FOUR SUPPORT PEOPLE DURING VISITATION HOURS ONLY (7 AM -8PM)   The support person(s) must pass our screening, gel in and out, and wear a mask at all times, including in the patient's room. Patients must also wear a mask when staff or their support person are in the room. Visitors GUEST BADGE MUST BE WORN VISIBLY  One adult visitor may remain with you overnight and MUST be in the room by 8 P.M.     Your procedure is scheduled on: 02/14/23   Report to [facility] Main Entrance    Report to admitting at AM   Call this number if you have problems the morning of surgery [phone]   Do not eat food :After Midnight.   After Midnight you may have the following liquids until : 4:00 AM DAY OF SURGERY  Water Black Coffee (sugar ok, NO MILK/CREAM OR CREAMERS)  Tea (sugar ok, NO MILK/CREAM OR CREAMERS) regular and decaf                             Plain Jell-O (NO RED)                                           Fruit ices (not with fruit pulp, NO RED)                                     Popsicles (NO RED)                                                                  Juice: apple, WHITE grape, WHITE cranberry Sports drinks like Gatorade (NO RED)   The day of surgery:  Drink ONE (1) Pre-Surgery Clear Ensure at : 4:00 AM the morning of surgery. Drink in one sitting. Do not sip.  This drink was given to you during your hospital  pre-op appointment visit. Nothing else to drink after completing the  Pre-Surgery Clear Ensure or G2.          If you have questions, please contact your surgeon's office.  FOLLOW ANY ADDITIONAL PRE OP INSTRUCTIONS YOU RECEIVED FROM YOUR SURGEON'S OFFICE!!!     Oral Hygiene  is also important to reduce your risk of infection.                                    Remember - BRUSH YOUR TEETH THE MORNING OF SURGERY WITH YOUR REGULAR TOOTHPASTE  DENTURES WILL BE REMOVED PRIOR TO SURGERY PLEASE DO NOT APPLY "Poly grip" OR ADHESIVES!!!   Do NOT smoke after Midnight   Take these medicines the morning of surgery  with A SIP OF WATER: escitalopram,albuterol                              You may not have any metal on your body including hair pins, jewelry, and body piercing             Do not wear make-up, lotions, powders, perfumes/cologne, or deodorant  Do not wear nail polish including gel and S&S, artificial/acrylic nails, or any other type of covering on natural nails including finger and toenails. If you have artificial nails, gel coating, etc. that needs to be removed by a nail salon please have this removed prior to surgery or surgery may need to be canceled/ delayed if the surgeon/ anesthesia feels like they are unable to be safely monitored.   Do not shave  48 hours prior to surgery.   Do not bring valuables to the hospital. [facility] IS NOT             RESPONSIBLE   FOR VALUABLES.   Contacts, glasses, or bridgework may not be worn into surgery.   Bring small overnight bag day of surgery.   DO NOT BRING YOUR HOME MEDICATIONS TO THE HOSPITAL. PHARMACY WILL DISPENSE MEDICATIONS LISTED ON YOUR MEDICATION LIST TO YOU DURING YOUR ADMISSION IN THE HOSPITAL!    Patients discharged on the day of surgery will not be allowed to drive home.  Someone NEEDS to stay with you for the first 24 hours after anesthesia.   Special Instructions: Bring a copy of your healthcare power of attorney and living will documents         the day of surgery if you haven't scanned them before.              Please read over the following fact sheets you were given: IF YOU HAVE QUESTIONS ABOUT YOUR PRE-OP INSTRUCTIONS PLEASE CALL [phone]      Pre-operative 5 CHG Bath Instructions    You can play a key role in reducing the risk of infection after surgery. Your skin needs to be as free of germs as possible. You can reduce the number of germs on your skin by washing with CHG (chlorhexidine gluconate) soap before surgery. CHG is an antiseptic soap that kills germs and continues to kill germs even after washing.   DO NOT use if you have an allergy to chlorhexidine/CHG or antibacterial soaps. If your skin becomes reddened or irritated, stop using the CHG and notify one of our RNs at : [phone].   Please shower with the CHG soap starting 4 days before surgery using the following schedule:     Please keep in mind the following:  DO NOT shave, including legs and underarms, starting the day of your first shower.   You may shave your face at any point before/day of surgery.  Place clean sheets on your bed the day you start using CHG soap. Use a clean washcloth (not used since being washed) for each shower. DO NOT sleep with pets once you start using the CHG.   CHG Shower Instructions:  If you choose to wash your hair and private area, wash first with your normal shampoo/soap.  After you use shampoo/soap, rinse your hair and body thoroughly to remove shampoo/soap residue.  Turn the water OFF and apply about 3 tablespoons (45 ml) of CHG soap to a CLEAN washcloth.  Apply CHG soap ONLY FROM YOUR NECK DOWN TO  YOUR TOES (washing for 3-5 minutes)  DO NOT use CHG soap on face, private areas, open wounds, or sores.  Pay special attention to the area where your surgery is being performed.  If you are having back surgery, having someone wash your back for you may be helpful. Wait 2 minutes after CHG soap is applied, then you may rinse off the CHG soap.  Pat dry with a clean towel  Put on clean clothes/pajamas   If you choose to wear lotion, please use ONLY the CHG-compatible lotions on the back of this paper.     Additional instructions for the day of surgery: DO NOT APPLY any  lotions, deodorants, cologne, or perfumes.   Put on clean/comfortable clothes.  Brush your teeth.  Ask your nurse before applying any prescription medications to the skin.   CHG Compatible Lotions   Aveeno Moisturizing lotion  Cetaphil Moisturizing Cream  Cetaphil Moisturizing Lotion  Clairol Herbal Essence Moisturizing Lotion, Dry Skin  Clairol Herbal Essence Moisturizing Lotion, Extra Dry Skin  Clairol Herbal Essence Moisturizing Lotion, Normal Skin  Curel Age Defying Therapeutic Moisturizing Lotion with Alpha Hydroxy  Curel Extreme Care Body Lotion  Curel Soothing Hands Moisturizing Hand Lotion  Curel Therapeutic Moisturizing Cream, Fragrance-Free  Curel Therapeutic Moisturizing Lotion, Fragrance-Free  Curel Therapeutic Moisturizing Lotion, Original Formula  Eucerin Daily Replenishing Lotion  Eucerin Dry Skin Therapy Plus Alpha Hydroxy Crme  Eucerin Dry Skin Therapy Plus Alpha Hydroxy Lotion  Eucerin Original Crme  Eucerin Original Lotion  Eucerin Plus Crme Eucerin Plus Lotion  Eucerin TriLipid Replenishing Lotion  Keri Anti-Bacterial Hand Lotion  Keri Deep Conditioning Original Lotion Dry Skin Formula Softly Scented  Keri Deep Conditioning Original Lotion, Fragrance Free Sensitive Skin Formula  Keri Lotion Fast Absorbing Fragrance Free Sensitive Skin Formula  Keri Lotion Fast Absorbing Softly Scented Dry Skin Formula  Keri Original Lotion  Keri Skin Renewal Lotion Keri Silky Smooth Lotion  Keri Silky Smooth Sensitive Skin Lotion  Nivea Body Creamy Conditioning Oil  Nivea Body Extra Enriched Lotion  Nivea Body Original Lotion  Nivea Body Sheer Moisturizing Lotion Nivea Crme  Nivea Skin Firming Lotion  NutraDerm 30 Skin Lotion  NutraDerm Skin Lotion  NutraDerm Therapeutic Skin Cream  NutraDerm Therapeutic Skin Lotion  ProShield Protective Hand Cream  Provon moisturizing lotion   Incentive Spirometer  An incentive spirometer is a tool that can help keep your  lungs clear and active. This tool measures how well you are filling your lungs with each breath. Taking long deep breaths may help reverse or decrease the chance of developing breathing (pulmonary) problems (especially infection) following: A long period of time when you are unable to move or be active. BEFORE THE PROCEDURE  If the spirometer includes an indicator to show your best effort, your nurse or respiratory therapist will set it to a desired goal. If possible, sit up straight or lean slightly forward. Try not to slouch. Hold the incentive spirometer in an upright position. INSTRUCTIONS FOR USE  Sit on the edge of your bed if possible, or sit up as far as you can in bed or on a chair. Hold the incentive spirometer in an upright position. Breathe out normally. Place the mouthpiece in your mouth and seal your lips tightly around it. Breathe in slowly and as deeply as possible, raising the piston or the ball toward the top of the column. Hold your breath for 3-5 seconds or for as long as possible. Allow the piston or ball to fall to  the bottom of the column. Remove the mouthpiece from your mouth and breathe out normally. Rest for a few seconds and repeat Steps 1 through 7 at least 10 times every 1-2 hours when you are awake. Take your time and take a few normal breaths between deep breaths. The spirometer may include an indicator to show your best effort. Use the indicator as a goal to work toward during each repetition. After each set of 10 deep breaths, practice coughing to be sure your lungs are clear. If you have an incision (the cut made at the time of surgery), support your incision when coughing by placing a pillow or rolled up towels firmly against it. Once you are able to get out of bed, walk around indoors and cough well. You may stop using the incentive spirometer when instructed by your caregiver.  RISKS AND COMPLICATIONS Take your time so you do not get dizzy or light-headed. If  you are in pain, you may need to take or ask for pain medication before doing incentive spirometry. It is harder to take a deep breath if you are having pain. AFTER USE Rest and breathe slowly and easily. It can be helpful to keep track of a log of your progress. Your caregiver can provide you with a simple table to help with this. If you are using the spirometer at home, follow these instructions: SEEK MEDICAL CARE IF:  You are having difficultly using the spirometer. You have trouble using the spirometer as often as instructed. Your pain medication is not giving enough relief while using the spirometer. You develop fever of 100.5 F (38.1 C) or higher. SEEK IMMEDIATE MEDICAL CARE IF:  You cough up bloody sputum that had not been present before. You develop fever of 102 F (38.9 C) or greater. You develop worsening pain at or near the incision site. MAKE SURE YOU:  Understand these instructions. Will watch your condition. Will get help right away if you are not doing well or get worse. Document Released: 09/13/2006 Document Revised: 07/26/2011 Document Reviewed: 11/14/2006 [facility] Patient Information 2014 [place], [place].   ________________________________________________________________________

## 2023-02-03 ENCOUNTER — Encounter (HOSPITAL_COMMUNITY)
Admission: RE | Admit: 2023-02-03 | Discharge: 2023-02-03 | Disposition: A | Discharge status: Home / Self Care | Payer: 59 | Source: Ambulatory Visit | Attending: Anesthesiology | Admitting: Anesthesiology

## 2023-02-03 DIAGNOSIS — Z01818 Encounter for other preprocedural examination: Secondary | ICD-10-CM

## 2023-02-04 NOTE — Patient Instructions (Addendum)
SURGICAL WAITING ROOM VISITATION  Patients having surgery or a procedure may have no more than 2 support people in the waiting area - these visitors may rotate.    Children under the age of 58 must have an adult with them who is not the patient.  Due to an increase in RSV and influenza rates and associated hospitalizations, children ages 80 and under may not visit patients in [facility] hospitals.  If the patient needs to stay at the hospital during part of their recovery, the visitor guidelines for inpatient rooms apply. Pre-op nurse will coordinate an appropriate time for 1 support person to accompany patient in pre-op.  This support person may not rotate.    Please refer to the [facility] website for the visitor guidelines for Inpatients (after your surgery is over and you are in a regular room).       Your procedure is scheduled on: 02/14/23   Report to [facility] Main Entrance    Report to admitting at 5:15 AM   Call this number if you have problems the morning of surgery [phone]   Do not eat food :After Midnight.   After Midnight you may have the following liquids until 4 AM DAY OF SURGERY  Water Non-Citrus Juices (without pulp, NO RED-Apple, White grape, White cranberry) Black Coffee (NO MILK/CREAM OR CREAMERS, sugar ok)  Clear Tea (NO MILK/CREAM OR CREAMERS, sugar ok) regular and decaf                             Plain Jell-O (NO RED)                                           Fruit ices (not with fruit pulp, NO RED)                                     Popsicles (NO RED)                                                               Sports drinks like Gatorade (NO RED)                The day of surgery:  Drink ONE (1) Pre-Surgery Clear Ensure 4 AM the morning of surgery. Drink in one sitting. Do not sip.  This drink was given to you during your hospital  pre-op appointment visit. Nothing else to drink after completing the  Pre-Surgery Clear Ensure     Oral Hygiene is also important to reduce your risk of infection.                                    Remember - BRUSH YOUR TEETH THE MORNING OF SURGERY WITH YOUR REGULAR TOOTHPASTE  DENTURES WILL BE REMOVED PRIOR TO SURGERY PLEASE DO NOT APPLY "Poly grip" OR ADHESIVES!!!   Do NOT smoke after Midnight   Stop all vitamins and herbal supplements 7 days before surgery.   Take  these medicines the morning of surgery: Escitaprolam, Albuterol              You may not have any metal on your body including hair pins, jewelry, and body piercing             Do not wear make-up, lotions, powders, perfumes/cologne, or deodorant  Do not wear nail polish including gel and S&S, artificial/acrylic nails, or any other type of covering on natural nails including finger and toenails. If you have artificial nails, gel coating, etc. that needs to be removed by a nail salon please have this removed prior to surgery or surgery may need to be canceled/ delayed if the surgeon/ anesthesia feels like they are unable to be safely monitored.   Do not shave  48 hours prior to surgery.    Do not bring valuables to the hospital. [facility] IS NOT             RESPONSIBLE   FOR VALUABLES.   Contacts, glasses, dentures or bridgework may not be worn into surgery.   Bring small overnight bag day of surgery.   DO NOT BRING YOUR HOME MEDICATIONS TO THE HOSPITAL. PHARMACY WILL DISPENSE MEDICATIONS LISTED ON YOUR MEDICATION LIST TO YOU DURING YOUR ADMISSION IN THE HOSPITAL!    Patients discharged on the day of surgery will not be allowed to drive home.  Someone NEEDS to stay with you for the first 24 hours after anesthesia.   Special Instructions: Bring a copy of your healthcare power of attorney and living will documents the day of surgery if you haven't scanned them before.              Please read over the following fact sheets you were given: IF YOU HAVE QUESTIONS ABOUT YOUR PRE-OP INSTRUCTIONS PLEASE CALL [phone]  Rosey Bath   If you received a COVID test during your pre-op visit  it is requested that you wear a mask when out in public, stay away from anyone that may not be feeling well and notify your surgeon if you develop symptoms. If you test positive for Covid or have been in contact with anyone that has tested positive in the last 10 days please notify you surgeon.      Pre-operative 5 CHG Bath Instructions   You can play a key role in reducing the risk of infection after surgery. Your skin needs to be as free of germs as possible. You can reduce the number of germs on your skin by washing with CHG (chlorhexidine gluconate) soap before surgery. CHG is an antiseptic soap that kills germs and continues to kill germs even after washing.   DO NOT use if you have an allergy to chlorhexidine/CHG or antibacterial soaps. If your skin becomes reddened or irritated, stop using the CHG and notify one of our RNs at [phone].   Please shower with the CHG soap starting 4 days before surgery using the following schedule:     Please keep in mind the following:  DO NOT shave, including legs and underarms, starting the day of your first shower.   You may shave your face at any point before/day of surgery.  Place clean sheets on your bed the day you start using CHG soap. Use a clean washcloth (not used since being washed) for each shower. DO NOT sleep with pets once you start using the CHG.   CHG Shower Instructions:  If you choose to wash your hair and private area, wash first  with your normal shampoo/soap.  After you use shampoo/soap, rinse your hair and body thoroughly to remove shampoo/soap residue.  Turn the water OFF and apply about 3 tablespoons (45 ml) of CHG soap to a CLEAN washcloth.  Apply CHG soap ONLY FROM YOUR NECK DOWN TO YOUR TOES (washing for 3-5 minutes)  DO NOT use CHG soap on face, private areas, open wounds, or sores.  Pay special attention to the area where your surgery is being  performed.  If you are having back surgery, having someone wash your back for you may be helpful. Wait 2 minutes after CHG soap is applied, then you may rinse off the CHG soap.  Pat dry with a clean towel  Put on clean clothes/pajamas   If you choose to wear lotion, please use ONLY the CHG-compatible lotions on the back of this paper.     Additional instructions for the day of surgery: DO NOT APPLY any lotions, deodorants, cologne, or perfumes.   Put on clean/comfortable clothes.  Brush your teeth.  Ask your nurse before applying any prescription medications to the skin.      CHG Compatible Lotions   Aveeno Moisturizing lotion  Cetaphil Moisturizing Cream  Cetaphil Moisturizing Lotion  Clairol Herbal Essence Moisturizing Lotion, Dry Skin  Clairol Herbal Essence Moisturizing Lotion, Extra Dry Skin  Clairol Herbal Essence Moisturizing Lotion, Normal Skin  Curel Age Defying Therapeutic Moisturizing Lotion with Alpha Hydroxy  Curel Extreme Care Body Lotion  Curel Soothing Hands Moisturizing Hand Lotion  Curel Therapeutic Moisturizing Cream, Fragrance-Free  Curel Therapeutic Moisturizing Lotion, Fragrance-Free  Curel Therapeutic Moisturizing Lotion, Original Formula  Eucerin Daily Replenishing Lotion  Eucerin Dry Skin Therapy Plus Alpha Hydroxy Crme  Eucerin Dry Skin Therapy Plus Alpha Hydroxy Lotion  Eucerin Original Crme  Eucerin Original Lotion  Eucerin Plus Crme Eucerin Plus Lotion  Eucerin TriLipid Replenishing Lotion  Keri Anti-Bacterial Hand Lotion  Keri Deep Conditioning Original Lotion Dry Skin Formula Softly Scented  Keri Deep Conditioning Original Lotion, Fragrance Free Sensitive Skin Formula  Keri Lotion Fast Absorbing Fragrance Free Sensitive Skin Formula  Keri Lotion Fast Absorbing Softly Scented Dry Skin Formula  Keri Original Lotion  Keri Skin Renewal Lotion Keri Silky Smooth Lotion  Keri Silky Smooth Sensitive Skin Lotion  Nivea Body Creamy Conditioning  Oil  Nivea Body Extra Enriched Lotion  Nivea Body Original Lotion  Nivea Body Sheer Moisturizing Lotion Nivea Crme  Nivea Skin Firming Lotion  NutraDerm 30 Skin Lotion  NutraDerm Skin Lotion  NutraDerm Therapeutic Skin Cream  NutraDerm Therapeutic Skin Lotion  ProShield Protective Hand Cream    Incentive Spirometer (Watch this video at home: [email or web address])  An incentive spirometer is a tool that can help keep your lungs clear and active. This tool measures how well you are filling your lungs with each breath. Taking long deep breaths may help reverse or decrease the chance of developing breathing (pulmonary) problems (especially infection) following: A long period of time when you are unable to move or be active. BEFORE THE PROCEDURE  If the spirometer includes an indicator to show your best effort, your nurse or respiratory therapist will set it to a desired goal. If possible, sit up straight or lean slightly forward. Try not to slouch. Hold the incentive spirometer in an upright position. INSTRUCTIONS FOR USE  Sit on the edge of your bed if possible, or sit up as far as you can in bed or on a chair. Hold the incentive spirometer in an upright position. Breathe out  normally. Place the mouthpiece in your mouth and seal your lips tightly around it. Breathe in slowly and as deeply as possible, raising the piston or the ball toward the top of the column. Hold your breath for 3-5 seconds or for as long as possible. Allow the piston or ball to fall to the bottom of the column. Remove the mouthpiece from your mouth and breathe out normally. Rest for a few seconds and repeat Steps 1 through 7 at least 10 times every 1-2 hours when you are awake. Take your time and take a few normal breaths between deep breaths. The spirometer may include an indicator to show your best effort. Use the indicator as a goal to work toward during each repetition. After each set of 10  deep breaths, practice coughing to be sure your lungs are clear. If you have an incision (the cut made at the time of surgery), support your incision when coughing by placing a pillow or rolled up towels firmly against it. Once you are able to get out of bed, walk around indoors and cough well. You may stop using the incentive spirometer when instructed by your caregiver.  RISKS AND COMPLICATIONS Take your time so you do not get dizzy or light-headed. If you are in pain, you may need to take or ask for pain medication before doing incentive spirometry. It is harder to take a deep breath if you are having pain. AFTER USE Rest and breathe slowly and easily. It can be helpful to keep track of a log of your progress. Your caregiver can provide you with a simple table to help with this. If you are using the spirometer at home, follow these instructions: SEEK MEDICAL CARE IF:  You are having difficultly using the spirometer. You have trouble using the spirometer as often as instructed. Your pain medication is not giving enough relief while using the spirometer. You develop fever of 100.5 F (38.1 C) or higher. SEEK IMMEDIATE MEDICAL CARE IF:  You cough up bloody sputum that had not been present before. You develop fever of 102 F (38.9 C) or greater. You develop worsening pain at or near the incision site. MAKE SURE YOU:  Understand these instructions. Will watch your condition. Will get help right away if you are not doing well or get worse. Document Released: 09/13/2006 Document Revised: 07/26/2011 Document Reviewed: 11/14/2006 [facility] Patient Information 2014 [place], [place].

## 2023-02-07 ENCOUNTER — Encounter (HOSPITAL_COMMUNITY): Payer: Self-pay

## 2023-02-07 ENCOUNTER — Other Ambulatory Visit: Payer: Self-pay

## 2023-02-07 ENCOUNTER — Encounter (HOSPITAL_COMMUNITY)
Admission: RE | Admit: 2023-02-07 | Discharge: 2023-02-07 | Disposition: A | Discharge status: Home / Self Care | Payer: 59 | Source: Ambulatory Visit | Attending: Orthopedic Surgery

## 2023-02-07 DIAGNOSIS — Z01818 Encounter for other preprocedural examination: Secondary | ICD-10-CM

## 2023-02-07 DIAGNOSIS — Z01812 Encounter for preprocedural laboratory examination: Secondary | ICD-10-CM | POA: Insufficient documentation

## 2023-02-07 LAB — CBC
HCT: 41.8 % (ref 36.0–46.0)
Hemoglobin: 12.9 g/dL (ref 12.0–15.0)
MCH: 28 pg (ref 26.0–34.0)
MCHC: 30.9 g/dL (ref 30.0–36.0)
MCV: 90.7 fL (ref 80.0–100.0)
Platelets: 310 10*3/uL (ref 150–400)
RBC: 4.61 MIL/uL (ref 3.87–5.11)
RDW: 12.8 % (ref 11.5–15.5)
WBC: 7.6 10*3/uL (ref 4.0–10.5)
nRBC: 0 % (ref 0.0–0.2)

## 2023-02-07 LAB — SURGICAL PCR SCREEN
MRSA, PCR: NEGATIVE
Staphylococcus aureus: NEGATIVE

## 2023-02-07 NOTE — Progress Notes (Signed)
COVID Vaccine received:  [x]  No []  Yes Date of any COVID positive Test in last 90 days: No PCP -  [facility] Cardiologist - no  Chest x-ray -  EKG -   Stress Test -  ECHO -  Cardiac Cath -   Bowel Prep - [x]  No  []   Yes ______  Pacemaker / ICD device [x]  No []  Yes   Spinal Cord Stimulator:[x]  No []  Yes       History of Sleep Apnea? [x]  No []  Yes   CPAP used?- [x]  No []  Yes    Does the patient monitor blood sugar?          [x]  No []  Yes  []  N/A  Patient has: []  NO Hx DM   [x]  Pre-DM                 []  DM1  []   DM2 Does patient have a [employer] or Dexacom? []  No []  Yes   Fasting Blood Sugar Ranges-  Checks Blood Sugar _____ times a day  GLP1 agonist / usual dose - Ozempic  Last dose was 02/03/23 GLP1 instructions:  SGLT-2 inhibitors / usual dose -  SGLT-2 instructions:   Blood Thinner / Instructions:no Aspirin Instructions:no  Comments:   Activity level: Patient is able to climb a flight of stairs without difficulty; [x]  No CP  [x]  No SOB,  Patient can perform ADLs without assistance.   Anesthesia review:   Patient denies shortness of breath, fever, cough and chest pain at PAT appointment.  Patient verbalized understanding and agreement to the Pre-Surgical Instructions that were given to them at this PAT appointment. Patient was also educated of the need to review these PAT instructions again prior to his/her surgery.I reviewed the appropriate phone numbers to call if they have any and questions or concerns.

## 2023-02-10 ENCOUNTER — Emergency Department
Admission: EM | Admit: 2023-02-10 | Discharge: 2023-02-11 | Disposition: A | Discharge status: Home / Self Care | Payer: 59 | Attending: Emergency Medicine | Admitting: Emergency Medicine

## 2023-02-10 ENCOUNTER — Emergency Department: Payer: 59

## 2023-02-10 DIAGNOSIS — R42 Dizziness and giddiness: Secondary | ICD-10-CM | POA: Diagnosis present

## 2023-02-10 LAB — BASIC METABOLIC PANEL
Anion gap: 8 (ref 5–15)
BUN: 16 mg/dL (ref 8–23)
CO2: 27 mmol/L (ref 22–32)
Calcium: 8.9 mg/dL (ref 8.9–10.3)
Chloride: 106 mmol/L (ref 98–111)
Creatinine, Ser: 0.69 mg/dL (ref 0.44–1.00)
GFR, Estimated: >60 mL/min (ref >60)
Glucose, Bld: 106 mg/dL — ABNORMAL HIGH (ref 70–99)
Potassium: 3.4 mmol/L — ABNORMAL LOW (ref 3.5–5.1)
Sodium: 141 mmol/L (ref 135–145)

## 2023-02-10 LAB — TROPONIN I (HIGH SENSITIVITY): Troponin I (High Sensitivity): <2 ng/L (ref <18)

## 2023-02-10 LAB — CBC
HCT: 40.3 % (ref 36.0–46.0)
Hemoglobin: 12.7 g/dL (ref 12.0–15.0)
MCH: 28.2 pg (ref 26.0–34.0)
MCHC: 31.5 g/dL (ref 30.0–36.0)
MCV: 89.4 fL (ref 80.0–100.0)
Platelets: 311 10*3/uL (ref 150–400)
RBC: 4.51 MIL/uL (ref 3.87–5.11)
RDW: 12.6 % (ref 11.5–15.5)
WBC: 6.7 10*3/uL (ref 4.0–10.5)
nRBC: 0 % (ref 0.0–0.2)

## 2023-02-10 MED ORDER — GADOBUTROL 1 MMOL/ML IV SOLN
7.5000 mL | Freq: Once | INTRAVENOUS | Status: AC | PRN
  Administered 2023-02-10: 7.5 mL via INTRAVENOUS

## 2023-02-10 MED ORDER — SODIUM CHLORIDE 0.9 % IV BOLUS
1000.0000 mL | Freq: Once | INTRAVENOUS | Status: AC
  Administered 2023-02-10: 1000 mL via INTRAVENOUS

## 2023-02-10 MED ORDER — ONDANSETRON HCL 4 MG/2ML IJ SOLN
4.0000 mg | Freq: Once | INTRAMUSCULAR | Status: AC
  Administered 2023-02-10: 4 mg via INTRAVENOUS
  Filled 2023-02-10: qty 2

## 2023-02-10 MED ORDER — DIAZEPAM 5 MG PO TABS: 5.0000 mg | ORAL_TABLET | Freq: Four times a day (QID) | ORAL | 0 refills | Status: AC | PRN

## 2023-02-10 MED ORDER — DIAZEPAM 5 MG/ML IJ SOLN
10.0000 mg | Freq: Once | INTRAMUSCULAR | Status: AC
  Administered 2023-02-10: 10 mg via INTRAVENOUS
  Filled 2023-02-10: qty 2

## 2023-02-10 NOTE — ED Notes (Signed)
Patient transported to MRI 

## 2023-02-10 NOTE — ED Provider Notes (Signed)
[facility] Provider Note    Event Date/Time   First MD Initiated Contact with Patient 02/10/23 2331     (approximate)   History   Dizziness (Patient was referred here by her PCP for dizziness x 1 week, no relief with Meclizine or Dramamine; Also reports nausea with the dizziness; Denies CP/SOB)   HPI  [name] is a 62 year old female presenting to the emergency department for evaluation of dizziness.  For the last week she has had ongoing dizziness described as a spinning sensation.  Seen by her primary care doctor who ordered her for meclizine and Dramamine without significant improvement.  Did feel that initially her dizziness was intermittent, now feels more constant.  Has associated nausea.  No chest pain or shortness of breath.  No history of similar.  No recent trauma.  No numbness, tingling, focal weakness, vision changes.     Physical Exam   Triage Vital Signs: ED Triage Vitals  Encounter Vitals Group     BP 02/10/23 1831 (!) 144/87     Systolic BP Percentile --      Diastolic BP Percentile --      Pulse Rate 02/10/23 1831 67     Resp 02/10/23 1831 16     Temp 02/10/23 1831 98.7 F (37.1 C)     Temp src --      SpO2 02/10/23 1831 100 %     Weight --      Height --      Head Circumference --      Peak Flow --      Pain Score 02/10/23 1831 0     Pain Loc --      Pain Education --      Exclude from Growth Chart --     Most recent vital signs: Vitals:   02/10/23 1831  BP: (!) 144/87  Pulse: 67  Resp: 16  Temp: 98.7 F (37.1 C)  SpO2: 100%     General: Awake, interactive  CV:  Regular rate, good peripheral perfusion.  Resp:  Lungs clear, unlabored respirations.  Abd:  Soft, nondistended.  Neuro:  Alert and oriented, normal extraocular movements, symmetric facial movement, sensation intact over bilateral upper and lower extremities with 5 out of 5 strength.  Normal finger-to-nose testing.   ED Results / Procedures /  Treatments   Labs (all labs ordered are listed, but only abnormal results are displayed) Labs Reviewed  BASIC METABOLIC PANEL - Abnormal; Notable for the following components:      Result Value   Potassium 3.4 (*)    Glucose, Bld 106 (*)    All other components within normal limits  CBC  TROPONIN I (HIGH SENSITIVITY)     EKG EKG independently reviewed interpreted by myself (ER attending) demonstrates:  EKG demonstrates sinus rhythm at a rate of 65, PR 152, QRS 92, QTc 445, no acute ST changes  RADIOLOGY Imaging independently reviewed and interpreted by myself demonstrates:  CT head without acute bleed Chest x-Aleathea Pugmire without focal consolidation MRI brain without acute findings  PROCEDURES:  Critical Care performed: No  Procedures   MEDICATIONS ORDERED IN ED: Medications  diazepam (VALIUM) injection 10 mg (10 mg Intravenous Given 02/10/23 2118)  ondansetron (ZOFRAN) injection 4 mg (4 mg Intravenous Given 02/10/23 2118)  sodium chloride 0.9 % bolus 1,000 mL (1,000 mLs Intravenous New Bag/Given 02/10/23 2116)  gadobutrol (GADAVIST) 1 MMOL/ML injection 7.5 mL (7.5 mLs Intravenous Contrast Given 02/10/23 2107)  IMPRESSION / MDM / ASSESSMENT AND PLAN / ED COURSE  I reviewed the triage vital signs and the nursing notes.  Differential diagnosis includes, but is not limited to, peripheral vertigo due to BPPV, other cause, central vertigo including stroke, mass, other etiology  Patient's presentation is most consistent with acute presentation with potential threat to life or bodily function.  62 year old female presenting to the emergency department for evaluation of vertigo.  Labs without significant derangement.  Ordered from triage are reassuring.  Given concern for worsening vertigo, MRI brain was obtained which was without acute findings.  Patient was treated symptomatically with Zofran, fluids, and Valium.  Does report improvement on reevaluation.  She is comfortable discharge  home.  Will DC with short course of Valium given improvement in her symptoms.  Strict return precautions provided.  Patient discharged in stable condition.     FINAL CLINICAL IMPRESSION(S) / ED DIAGNOSES   Final diagnoses:  Vertigo     Rx / DC Orders   ED Discharge Orders          Ordered    diazepam (VALIUM) 5 MG tablet  Every 6 hours PRN        02/10/23 2351             Note:  This document was prepared using Dragon voice recognition software and may include unintentional dictation errors.   Trinna Post, MD 02/10/23 [phone]

## 2023-02-10 NOTE — ED Triage Notes (Signed)
Patient was referred here by her PCP for dizziness x 1 week, no relief with Meclizine or Dramamine; Also reports nausea with the dizziness; Denies CP/SOB

## 2023-02-10 NOTE — Discharge Instructions (Addendum)
You were seen in the ER today for evaluation of your dizziness.  Your testing fortunately did not show an emergency cause for this.  I sent a prescription for a medication to help with your dizziness called Valium.  This can make you sleepy, do not drive or operate machinery when taking this.  I have also included information for follow-up with ENT for further evaluation of your vertigo.  Return to the ER for any new or worsening symptoms.

## 2023-02-14 ENCOUNTER — Ambulatory Visit (HOSPITAL_COMMUNITY): Admission: RE | Admit: 2023-02-14 | Payer: 59 | Source: Ambulatory Visit | Admitting: Orthopedic Surgery

## 2023-02-14 ENCOUNTER — Encounter (HOSPITAL_COMMUNITY): Admission: RE | Payer: Self-pay | Source: Ambulatory Visit

## 2023-02-14 SURGERY — ARTHROPLASTY, KNEE, TOTAL
Anesthesia: Choice | Site: Knee | Laterality: Left

## 2023-02-24 ENCOUNTER — Other Ambulatory Visit: Payer: Self-pay | Admitting: Physician Assistant

## 2023-02-24 ENCOUNTER — Ambulatory Visit
Admission: RE | Admit: 2023-02-24 | Discharge: 2023-02-24 | Disposition: A | Discharge status: Home / Self Care | Payer: 59 | Source: Ambulatory Visit | Attending: Physician Assistant | Admitting: Physician Assistant

## 2023-02-24 DIAGNOSIS — M79605 Pain in left leg: Secondary | ICD-10-CM | POA: Insufficient documentation

## 2023-02-24 DIAGNOSIS — M7989 Other specified soft tissue disorders: Secondary | ICD-10-CM | POA: Diagnosis present

## 2023-06-01 ENCOUNTER — Encounter: Payer: Self-pay | Admitting: Neurology

## 2023-06-01 ENCOUNTER — Ambulatory Visit (INDEPENDENT_AMBULATORY_CARE_PROVIDER_SITE_OTHER): Payer: 59 | Admitting: Neurology

## 2023-06-01 VITALS — BP 150/92 | HR 75 | Ht 64.5 in | Wt 195.5 lb

## 2023-06-01 DIAGNOSIS — R42 Dizziness and giddiness: Secondary | ICD-10-CM | POA: Diagnosis not present

## 2023-06-01 DIAGNOSIS — G43709 Chronic migraine without aura, not intractable, without status migrainosus: Secondary | ICD-10-CM | POA: Diagnosis not present

## 2023-06-01 MED ORDER — PROPRANOLOL HCL ER 60 MG PO CP24: 60.0000 mg | ORAL_CAPSULE | Freq: Every day | ORAL | 0 refills | Status: DC

## 2023-06-01 MED ORDER — SUMATRIPTAN SUCCINATE 50 MG PO TABS: 50.0000 mg | ORAL_TABLET | ORAL | 0 refills | Status: DC | PRN

## 2023-06-01 NOTE — Progress Notes (Signed)
 [facility]  PATIENT: [name] DOB: [birth year]  REQUESTING CLINICIAN: Renda Carpen, PA HISTORY FROM: Patient   REASON FOR VISIT: Dizziness    HISTORICAL  CHIEF COMPLAINT:  Chief Complaint  Patient presents with   New Patient (Initial Visit)    Rm13, alone, referral for Dizziness:ongoing since september, abnormal central findings on VNG from Renda Carpen [facility] [phone]:    HISTORY OF PRESENT ILLNESS:  This is a 63 year old woman with multiple medical conditions including chronic pain, arthritis, depression, hypertension, hyperlipidemia, chronic migraine, fibromyalgia who is presenting with complaint of dizziness/vertigo since September.  Patient reports in September she had severe dizziness described as room spinning sensation causing her to fall.  She presented to the ED, initial workup was negative and she was referred to ENT.  Patient reports following up with ENT, was initially given prednisone without benefit.  She did have a VNG and was told it was abnormal, and she was referred to neurology.  VNG report not available for review. She tells me since September she has ongoing dizziness, daily, some days, it is mild, sometimes severe to the point that she can fall.  She reports these dizziness do not change in position, she can feel it while sitting down, laying down, or standing and she feels like everything is moving.  She reports that Valium  has been helpful.  She has not tried vestibular therapy. She also reported history of chronic migraine, for many years, bifrontal, she has tried topiramate  in the past but it causes brain fog and memory loss, therefore discontinued. Currently for the migraines she takes ibuprofen  daily.    OTHER MEDICAL CONDITIONS: Chronic pain, depression, Hypertension, hyperlipidemia, Chronic migraines.   REVIEW OF SYSTEMS: Full 14 system review of systems performed and negative with exception of: As  noted in the HPI   ALLERGIES: Allergies  Allergen Reactions   Fish Allergy Anaphylaxis    To all fish, not just shellfish.  Cannot tolerate even the smell of seafood without her airway swelling/closing.   Other Shortness Of Breath    Whipped cream and pudding   Nortriptyline Other (See Comments)    hallucinations   Cyclobenzaprine    Tylenol  [Acetaminophen ] Other (See Comments)    Cannot take due to past liver failure   Bee Venom Hives   Flexeril [Cyclobenzaprine Hcl] Other (See Comments)    Intense anger    HOME MEDICATIONS: Outpatient Medications Prior to Visit  Medication Sig Dispense Refill   albuterol  (PROVENTIL ) 2 MG tablet Take 4 mg by mouth every 8 (eight) hours.     celecoxib  (CELEBREX ) 200 MG capsule Take 1 capsule (200 mg total) by mouth every 12 (twelve) hours. Take along with Pepcid  to help avoid GI upset. (Patient taking differently: Take 200 mg by mouth in the morning. Take along with Pepcid  to help avoid GI upset.) 60 capsule 0   diazepam  (VALIUM ) 5 MG tablet Take 1 tablet (5 mg total) by mouth every 6 (six) hours as needed (vertigo). 20 tablet 0   EPIPEN  2-PAK 0.3 MG/0.3ML SOAJ injection Inject 0.3 mg as directed as needed (allergic reaction).      escitalopram (LEXAPRO) 10 MG tablet Take 10 mg by mouth in the morning.     ferrous sulfate  325 (65 FE) MG tablet Take 325 mg by mouth in the morning.     ibuprofen  (ADVIL ) 200 MG tablet Take 400 mg by mouth every 8 (eight) hours as needed (pain.).     Menthol , Topical Analgesic, (  ABSORBINE PLUS JR) 6.5 % PTCH Place 1 patch onto the skin daily as needed (pain.).     methocarbamol  (ROBAXIN ) 500 MG tablet Take 500 mg by mouth 2 (two) times daily as needed for muscle spasms.     morphine  (MSIR) 15 MG tablet Take 15 mg by mouth every 8 (eight) hours as needed for severe pain.     ondansetron  (ZOFRAN ) 8 MG tablet Take 8 mg by mouth 3 (three) times daily as needed for nausea or vomiting.     rOPINIRole  (REQUIP ) 1 MG tablet Take  1 mg by mouth at bedtime.     rosuvastatin (CRESTOR) 5 MG tablet Take 5 mg by mouth in the morning.     Semaglutide, 2 MG/DOSE, (OZEMPIC, 2 MG/DOSE,) 8 MG/3ML SOPN Inject 2 mg into the skin every Thursday.     APPLE CIDER VINEGAR PO Take 500 mg by mouth in the morning. Gummies (2)     Cholecalciferol (VITAMIN D3) 250 MCG (10000 UT) TABS Take 10,000 Units by mouth in the morning.     Cyanocobalamin (VITAMIN B12) 3000 MCG SUBL Take 3,000 mcg by mouth in the morning. Gummies (2)     traZODone (DESYREL) 50 MG tablet Take 50 mg by mouth at bedtime.     No facility-administered medications prior to visit.    PAST MEDICAL HISTORY: Past Medical History:  Diagnosis Date   Anemia    Anxiety    Arthritis    multiple areas   Bone marrow disease    DX many years ago - pt unsure of name and unable to find info on type of disease   Complication of anesthesia 2011   slow to awaken after hysterectomy   Depression    Dyspnea    if does not take albuterol  she states she can not breath    Family history of adverse reaction to anesthesia    pts mother has nausea and vomiting; pts sons become aggressive and have hallicunations   Fibromyalgia    H/O iron deficiency anemia    Headache    MIGRAINES   History of blood transfusion    day pt was born   History of bronchitis    Hx of pneumothorax    X2 IN PAST   Kidney disease    only has one functioning kidney - no problems  x 23 yrs   Liver failure, acute 5 years ago   cause unknown, after hysterectomy   Neuropathy    Numbness    MULTIPLE AREAS - "from all the surgeries I've had"   PONV (postoperative nausea and vomiting)     PAST SURGICAL HISTORY: Past Surgical History:  Procedure Laterality Date   ABDOMINAL HYSTERECTOMY     BACK SURGERY     multiple   CERVICAL  SPINE SURG     SEVERAL TIMES   CERVICAL DISCECTOMY     with fusion   DILATION AND CURETTAGE OF UTERUS     FOOT SURGERY  1989   HERNIA REPAIR  2011   KNEE ARTHROSCOPY WITH  DRILLING/MICROFRACTURE Right 03/13/2014   Procedure: RIGHT MICROFRACTURE TECHNIQUE;  Surgeon: Florencia Hunter, MD;  Location: [facility];  Service: Orthopedics;  Laterality: Right;   KNEE ARTHROSCOPY WITH MEDIAL MENISECTOMY Right 03/13/2014   Procedure: RIGHT KNEE ARTHROSCOPY WITH MEDIAL FEMORAL CONDYLE REPAIR;  Surgeon: Florencia Hunter, MD;  Location: [facility];  Service: Orthopedics;  Laterality: Right;   KNEE ARTHROSCOPY WITH MEDIAL PATELLAR FEMORAL LIGAMENT RECONSTRUCTION Right 03/13/2014   Procedure:  ABRASION CHONDROPLASTY OF MEDIAL FEMORAL;  Surgeon: Florencia Hunter, MD;  Location: [facility];  Service: Orthopedics;  Laterality: Right;   left knee arthroscopy     polyp removal  2010   uterine   right leg surgery      secondary to trauma   SHOULDER SURGERY     Bilaterally X9 SURGERIES   SYNOVECTOMY Right 03/13/2014   Procedure: SYNOVECTOMY OF THE SUPRAPATELLA POUCH;  Surgeon: Florencia Hunter, MD;  Location: [facility];  Service: Orthopedics;  Laterality: Right;   TONSILLECTOMY     TOTAL KNEE ARTHROPLASTY Right 07/30/2014   Procedure: RIGHT TOTAL KNEE ARTHROPLASTY;  Surgeon: Hazle Lites, MD;  Location: [facility];  Service: Orthopedics;  Laterality: Right;   TOTAL KNEE REVISION WITH SCAR DEBRIDEMENT/PATELLA REVISION WITH POLY EXCHANGE Right 04/05/2016   Procedure: OPEN SCAR DEBRIDEMENT WITH POLY EXCHANGE reviosion of patella;  Surgeon: Claiborne Crew, MD;  Location: [facility];  Service: Orthopedics;  Laterality: Right;    FAMILY HISTORY: Family History  Problem Relation Age of Onset   Heart disease Mother    Heart disease Father    Colon cancer Father    Heart disease Maternal Grandfather    Heart disease Maternal Grandmother    Heart disease Paternal Grandfather    Heart disease Paternal Grandmother     SOCIAL HISTORY: Social History   Socioeconomic History   Marital status: Divorced    Spouse name: Not on file   Number of children: 2   Years of education: Not on file   Highest education  level: Not on file  Occupational History   Occupation: disabled    Comment: back problems  Tobacco Use   Smoking status: Never   Smokeless tobacco: Never  Vaping Use   Vaping status: Never Used  Substance and Sexual Activity   Alcohol use: No   Drug use: No   Sexual activity: Not on file  Other Topics Concern   Not on file  Social History Narrative   Divorced and lives with her youngest son. Disabled with back problems.   Social Drivers of Corporate investment banker Strain: High Risk (05/30/2023)   [facility]   Overall Financial Resource Strain (CARDIA)    Difficulty of Paying Living Expenses: Very hard  Food Insecurity: Food Insecurity Present (05/30/2023)   Received from [facility]   Hunger Vital Sign    Worried About Running Out of Food in the Last Year: Often true    Ran Out of Food in the Last Year: Often true  Transportation Needs: No Transportation Needs (05/30/2023)   Received from [facility] - Transportation    In the past 12 months, has lack of transportation kept you from medical appointments or from getting medications?: No    Lack of Transportation (Non-Medical): No  Physical Activity: Not on file  Stress: Not on file  Social Connections: Not on file  Intimate Partner Violence: Not on file    PHYSICAL EXAM  GENERAL EXAM/CONSTITUTIONAL: Vitals:  Vitals:   06/01/23 0809 06/01/23 0810 06/01/23 0811  BP: (!) 144/82 (!) 151/90 (!) 150/92  Pulse: 72 71 75  Weight: 195 lb 8 oz (88.7 kg)    Height: 5' 4.5" (1.638 m)     Body mass index is 33.04 kg/m. Wt Readings from Last 3 Encounters:  06/01/23 195 lb 8 oz (88.7 kg)  02/07/23 180 lb (81.6 kg)  09/21/22 172 lb (78 kg)  Patient is in no distress; well developed, nourished and groomed; neck is supple  MUSCULOSKELETAL: Gait, strength, tone, movements noted in Neurologic exam below  NEUROLOGIC: MENTAL STATUS:      No data  to display         awake, alert, oriented to person, place and time recent and remote memory intact normal attention and concentration language fluent, comprehension intact, naming intact fund of knowledge appropriate  CRANIAL NERVE:  2nd, 3rd, 4th, 6th - Visual fields full to confrontation, extraocular muscles intact, no nystagmus 5th - facial sensation symmetric 7th - facial strength symmetric 8th - hearing intact 9th - palate elevates symmetrically, uvula midline 11th - shoulder shrug symmetric 12th - tongue protrusion midline  MOTOR:  normal bulk and tone, full strength in the BUE, BLE  SENSORY:  normal and symmetric to light touch  COORDINATION:  finger-nose-finger, fine finger movements normal  GAIT/STATION:  Uses a cane, antalgic    DIAGNOSTIC DATA (LABS, IMAGING, TESTING) - I reviewed patient records, labs, notes, testing and imaging myself where available.  Lab Results  Component Value Date   WBC 6.7 02/10/2023   HGB 12.7 02/10/2023   HCT 40.3 02/10/2023   MCV 89.4 02/10/2023   PLT 311 02/10/2023      Component Value Date/Time   NA 141 02/10/2023 1833   K 3.4 (L) 02/10/2023 1833   CL 106 02/10/2023 1833   CO2 27 02/10/2023 1833   GLUCOSE 106 (H) 02/10/2023 1833   BUN 16 02/10/2023 1833   CREATININE 0.69 02/10/2023 1833   CREATININE 0.83 06/07/2018 0958   CALCIUM 8.9 02/10/2023 1833   PROT 7.2 01/12/2020 0337   ALBUMIN 4.1 01/12/2020 0337   AST 20 01/12/2020 0337   AST 13 (L) 06/07/2018 0958   ALT 13 01/12/2020 0337   ALT 9 06/07/2018 0958   ALKPHOS 90 01/12/2020 0337   BILITOT 0.9 01/12/2020 0337   BILITOT 0.4 06/07/2018 0958   GFRNONAA >60 02/10/2023 1833   GFRNONAA >60 06/07/2018 0958   GFRAA >60 01/12/2020 0337   GFRAA >60 06/07/2018 0958   No results found for: "CHOL", "HDL", "LDLCALC", "LDLDIRECT", "TRIG", "CHOLHDL" No results found for: "HGBA1C" Lab Results  Component Value Date   VITAMINB12 232 04/27/2018   Lab Results   Component Value Date   TSH 4.47 08/13/2010    MRI Brain with and without contrast 02/10/2023 Normal brain MRI. No acute intracranial abnormality identified.     ASSESSMENT AND PLAN  63 y.o. year old female with multiple medical conditions including migraine headaches, fibromyalgia, depression, chronic pain who is presenting with vertigo/dizziness since September.  ENT evaluation could not find a clear etiology, her brain MRI is normal.  Since she has chronic migraine, currently not treated we will treat these dizzy spells as vestibular migraine.  First we will start patient on propranolol  as preventive medication and give her Imitrex  as abortive medication.  She also tells me that she takes ibuprofen  daily, most likely causing medication overuse headache.  Advised patient to decrease the ibuprofen  to no more than 3 days a week. We will refer her for vestibular therapy. She will contact us  in a month for updates.  I will see her in 6 months for follow-up or sooner if worse.   1. Chronic migraine w/o aura, not intractable, w/o stat migr   2. Vertigo      Patient Instructions  Continue current medications Start propranolol  60 mg XR daily for migraine prevention Imitrex  as needed for migraine, can  also try when having severe dizziness Reduce the ibuprofen  to no more than 3 days a week Referral to vestibular therapy  Please contact us  for updates in a month Return in 6 months or sooner if worse   No orders of the defined types were placed in this encounter.   Meds ordered this encounter  Medications   propranolol  ER (INDERAL  LA) 60 MG 24 hr capsule    Sig: Take 1 capsule (60 mg total) by mouth daily.    Dispense:  30 capsule    Refill:  0   SUMAtriptan  (IMITREX ) 50 MG tablet    Sig: Take 1 tablet (50 mg total) by mouth every 2 (two) hours as needed for migraine. May repeat in 2 hours if headache persists or recurs.    Dispense:  10 tablet    Refill:  0    Return in about 6  months (around 11/29/2023).    Cassandra Cleveland, MD 06/01/2023, 9:02 AM  [facility] [address] [place], [place] [address] [phone]

## 2023-06-01 NOTE — Patient Instructions (Addendum)
 Continue current medications Start propranolol  60 mg XR daily for migraine prevention Imitrex  as needed for migraine, can also try when having severe dizziness Reduce the ibuprofen  to no more than 3 days a week Referral to vestibular therapy  Please contact us  for updates in a month Return in 6 months or sooner if worse

## 2023-06-02 ENCOUNTER — Encounter: Payer: Self-pay | Admitting: Physical Therapy

## 2023-06-02 ENCOUNTER — Other Ambulatory Visit: Payer: Self-pay

## 2023-06-02 ENCOUNTER — Ambulatory Visit: Payer: 59 | Attending: Neurology | Admitting: Physical Therapy

## 2023-06-02 VITALS — BP 140/82 | HR 70

## 2023-06-02 DIAGNOSIS — R2681 Unsteadiness on feet: Secondary | ICD-10-CM | POA: Insufficient documentation

## 2023-06-02 DIAGNOSIS — R42 Dizziness and giddiness: Secondary | ICD-10-CM | POA: Diagnosis present

## 2023-06-02 DIAGNOSIS — R296 Repeated falls: Secondary | ICD-10-CM | POA: Diagnosis present
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
## 2023-06-15 ENCOUNTER — Ambulatory Visit: Payer: 59 | Admitting: Neurology

## 2023-06-28 ENCOUNTER — Ambulatory Visit: Payer: 59 | Admitting: Physical Therapy

## 2023-06-30 ENCOUNTER — Other Ambulatory Visit: Payer: Self-pay | Admitting: Neurology

## 2023-06-30 NOTE — Telephone Encounter (Signed)
Rx refilled per last office visit note.

## 2023-07-04 ENCOUNTER — Other Ambulatory Visit: Payer: 59

## 2023-07-05 ENCOUNTER — Ambulatory Visit: Payer: 59 | Admitting: Physical Therapy

## 2023-07-07 ENCOUNTER — Ambulatory Visit: Payer: 59 | Admitting: "Endocrinology

## 2023-08-11 ENCOUNTER — Other Ambulatory Visit: Payer: Self-pay | Admitting: Sports Medicine

## 2023-08-11 DIAGNOSIS — M79605 Pain in left leg: Secondary | ICD-10-CM

## 2023-08-16 ENCOUNTER — Ambulatory Visit
Admission: RE | Admit: 2023-08-16 | Discharge: 2023-08-16 | Disposition: A | Discharge status: Home / Self Care | Source: Ambulatory Visit | Attending: Sports Medicine | Admitting: Sports Medicine

## 2023-08-16 DIAGNOSIS — M79605 Pain in left leg: Secondary | ICD-10-CM

## 2023-08-26 ENCOUNTER — Other Ambulatory Visit: Payer: Self-pay | Admitting: Neurology

## 2023-11-25 ENCOUNTER — Other Ambulatory Visit: Payer: Self-pay | Admitting: Neurology

## 2023-12-08 ENCOUNTER — Encounter: Payer: Self-pay | Admitting: Neurology

## 2023-12-08 ENCOUNTER — Ambulatory Visit: Payer: 59 | Admitting: Neurology

## 2023-12-08 VITALS — BP 130/74 | Ht 65.0 in | Wt 209.0 lb

## 2023-12-08 DIAGNOSIS — G43709 Chronic migraine without aura, not intractable, without status migrainosus: Secondary | ICD-10-CM | POA: Diagnosis not present

## 2023-12-08 DIAGNOSIS — R42 Dizziness and giddiness: Secondary | ICD-10-CM

## 2023-12-08 MED ORDER — SUMATRIPTAN SUCCINATE 50 MG PO TABS: 50.0000 mg | ORAL_TABLET | ORAL | 11 refills | Status: DC | PRN

## 2023-12-08 MED ORDER — PROPRANOLOL HCL ER 60 MG PO CP24: 60.0000 mg | ORAL_CAPSULE | Freq: Every day | ORAL | 3 refills | Status: AC

## 2023-12-08 NOTE — Progress Notes (Signed)
 [facility]  PATIENT: [name] DOB: [birth year]  REQUESTING CLINICIAN: Loring Tanda Mae, * HISTORY FROM: Patient   REASON FOR VISIT: Dizziness    HISTORICAL  CHIEF COMPLAINT:  Chief Complaint  Patient presents with   Follow-up    Rm 13, migraines stable   INTERVAL HISTORY 12/08/2023:  Patient presents today for follow-up for her migraine, last visit was in January, at that time we started her on propranolol  as preventative and sumatriptan  as abortive.  She tells me that her migraine intensity is decreased but she still having daily headache.  Seems like propranolol  is helping.  She also have chronic pain syndrome and fibromyalgia.  She tells me during the month she will take 5-6 sumatriptan  for her migraine. She has seen her PCP recently for left lower extremity pain and had CT scan of the leg acute abnormality and she has been worked up for Paget disease.  For her Vertigo, she reports being started on Valium  and it does help her symptoms.   HISTORY OF PRESENT ILLNESS:  This is a 63 year old woman with multiple medical conditions including chronic pain, arthritis, depression, hypertension, hyperlipidemia, chronic migraine, fibromyalgia who is presenting with complaint of dizziness/vertigo since September.  Patient reports in September she had severe dizziness described as room spinning sensation causing her to fall.  She presented to the ED, initial workup was negative and she was referred to ENT.  Patient reports following up with ENT, was initially given prednisone without benefit.  She did have a VNG and was told it was abnormal, and she was referred to neurology.  VNG report not available for review. She tells me since September she has ongoing dizziness, daily, some days, it is mild, sometimes severe to the point that she can fall.  She reports these dizziness do not change in position, she can feel it while sitting down, laying down, or standing and  she feels like everything is moving.  She reports that Valium  has been helpful.  She has not tried vestibular therapy. She also reported history of chronic migraine, for many years, bifrontal, she has tried topiramate  in the past but it causes brain fog and memory loss, therefore discontinued. Currently for the migraines she takes ibuprofen  daily.    OTHER MEDICAL CONDITIONS: Chronic pain, depression, Hypertension, hyperlipidemia, Chronic migraines.   REVIEW OF SYSTEMS: Full 14 system review of systems performed and negative with exception of: As noted in the HPI   ALLERGIES: Allergies  Allergen Reactions   Fish Allergy Anaphylaxis    To all fish, not just shellfish.  Cannot tolerate even the smell of seafood without her airway swelling/closing.   Other Shortness Of Breath    Whipped cream and pudding   Nortriptyline Other (See Comments)    hallucinations   Cyclobenzaprine    Tylenol  [Acetaminophen ] Other (See Comments)    Cannot take due to past liver failure   Bee Venom Hives   Flexeril [Cyclobenzaprine Hcl] Other (See Comments)    Intense anger    HOME MEDICATIONS: Outpatient Medications Prior to Visit  Medication Sig Dispense Refill   albuterol  (PROVENTIL ) 2 MG tablet Take 4 mg by mouth every 8 (eight) hours.     celecoxib  (CELEBREX ) 200 MG capsule Take 1 capsule (200 mg total) by mouth every 12 (twelve) hours. Take along with Pepcid  to help avoid GI upset. (Patient taking differently: Take 200 mg by mouth in the morning. Take along with Pepcid  to help avoid GI upset.) 60 capsule 0  diazepam  (VALIUM ) 5 MG tablet Take 1 tablet (5 mg total) by mouth every 6 (six) hours as needed (vertigo). (Patient taking differently: Take 2 mg by mouth every 6 (six) hours as needed (vertigo).) 20 tablet 0   EPIPEN  2-PAK 0.3 MG/0.3ML SOAJ injection Inject 0.3 mg as directed as needed (allergic reaction).      escitalopram (LEXAPRO) 10 MG tablet Take 10 mg by mouth in the morning.     ferrous  sulfate 325 (65 FE) MG tablet Take 325 mg by mouth in the morning.     ibuprofen  (ADVIL ) 200 MG tablet Take 400 mg by mouth every 8 (eight) hours as needed (pain.).     Menthol , Topical Analgesic, (ABSORBINE PLUS JR) 6.5 % PTCH Place 1 patch onto the skin daily as needed (pain.).     methocarbamol  (ROBAXIN ) 500 MG tablet Take 500 mg by mouth 2 (two) times daily as needed for muscle spasms.     morphine  (MSIR) 15 MG tablet Take 15 mg by mouth every 8 (eight) hours as needed for severe pain.     ondansetron  (ZOFRAN ) 8 MG tablet Take 8 mg by mouth 3 (three) times daily as needed for nausea or vomiting.     rOPINIRole  (REQUIP ) 1 MG tablet Take 1 mg by mouth at bedtime.     rosuvastatin (CRESTOR) 5 MG tablet Take 5 mg by mouth in the morning.     Semaglutide, 2 MG/DOSE, (OZEMPIC, 2 MG/DOSE,) 8 MG/3ML SOPN Inject 2 mg into the skin every Thursday.     propranolol  ER (INDERAL  LA) 60 MG 24 hr capsule TAKE 1 CAPSULE (60 MG TOTAL) BY MOUTH DAILY. 30 capsule 2   SUMAtriptan  (IMITREX ) 50 MG tablet TAKE 1 TABLET BY MOUTH AS NEEDED FOR MIGRAINE. MAY REPEAT IN 2 HOURS IF HEADACHE PERSISTS OR RECURS. 10 tablet 1   No facility-administered medications prior to visit.    PAST MEDICAL HISTORY: Past Medical History:  Diagnosis Date   Anemia    Anxiety    Arthritis    multiple areas   Bone marrow disease    DX many years ago - pt unsure of name and unable to find info on type of disease   Complication of anesthesia 2011   slow to awaken after hysterectomy   Depression    Dyspnea    if does not take albuterol  she states she can not breath    Family history of adverse reaction to anesthesia    pts mother has nausea and vomiting; pts sons become aggressive and have hallicunations   Fibromyalgia    H/O iron deficiency anemia    Headache    MIGRAINES   History of blood transfusion    day pt was born   History of bronchitis    Hx of pneumothorax    X2 IN PAST   Kidney disease    only has one  functioning kidney - no problems  x 23 yrs   Liver failure, acute 5 years ago   cause unknown, after hysterectomy   Neuropathy    Numbness    MULTIPLE AREAS - from all the surgeries I've had   PONV (postoperative nausea and vomiting)     PAST SURGICAL HISTORY: Past Surgical History:  Procedure Laterality Date   ABDOMINAL HYSTERECTOMY     BACK SURGERY     multiple   CERVICAL  SPINE SURG     SEVERAL TIMES   CERVICAL DISCECTOMY     with fusion   DILATION AND  CURETTAGE OF UTERUS     FOOT SURGERY  1989   HERNIA REPAIR  2011   KNEE ARTHROSCOPY WITH DRILLING/MICROFRACTURE Right 03/13/2014   Procedure: RIGHT MICROFRACTURE TECHNIQUE;  Surgeon: Tanda DELENA Heading, MD;  Location: [facility];  Service: Orthopedics;  Laterality: Right;   KNEE ARTHROSCOPY WITH MEDIAL MENISECTOMY Right 03/13/2014   Procedure: RIGHT KNEE ARTHROSCOPY WITH MEDIAL FEMORAL CONDYLE REPAIR;  Surgeon: Tanda DELENA Heading, MD;  Location: [facility];  Service: Orthopedics;  Laterality: Right;   KNEE ARTHROSCOPY WITH MEDIAL PATELLAR FEMORAL LIGAMENT RECONSTRUCTION Right 03/13/2014   Procedure: ABRASION CHONDROPLASTY OF MEDIAL FEMORAL;  Surgeon: Tanda DELENA Heading, MD;  Location: [facility];  Service: Orthopedics;  Laterality: Right;   left knee arthroscopy     polyp removal  2010   uterine   right leg surgery      secondary to trauma   SHOULDER SURGERY     Bilaterally X9 SURGERIES   SYNOVECTOMY Right 03/13/2014   Procedure: SYNOVECTOMY OF THE SUPRAPATELLA POUCH;  Surgeon: Tanda DELENA Heading, MD;  Location: [facility];  Service: Orthopedics;  Laterality: Right;   TONSILLECTOMY     TOTAL KNEE ARTHROPLASTY Right 07/30/2014   Procedure: RIGHT TOTAL KNEE ARTHROPLASTY;  Surgeon: Tanda Heading, MD;  Location: [facility];  Service: Orthopedics;  Laterality: Right;   TOTAL KNEE REVISION WITH SCAR DEBRIDEMENT/PATELLA REVISION WITH POLY EXCHANGE Right 04/05/2016   Procedure: OPEN SCAR DEBRIDEMENT WITH POLY EXCHANGE reviosion of patella;  Surgeon: Donnice Car, MD;  Location: [facility];  Service: Orthopedics;  Laterality: Right;    FAMILY HISTORY: Family History  Problem Relation Age of Onset   Heart disease Mother    Heart disease Father    Colon cancer Father    Heart disease Maternal Grandfather    Heart disease Maternal Grandmother    Heart disease Paternal Grandfather    Heart disease Paternal Grandmother     SOCIAL HISTORY: Social History   Socioeconomic History   Marital status: Divorced    Spouse name: Not on file   Number of children: 2   Years of education: Not on file   Highest education level: Not on file  Occupational History   Occupation: disabled    Comment: back problems  Tobacco Use   Smoking status: Never   Smokeless tobacco: Never  Vaping Use   Vaping status: Never Used  Substance and Sexual Activity   Alcohol use: No   Drug use: No   Sexual activity: Not on file  Other Topics Concern   Not on file  Social History Narrative   Divorced and lives with her youngest son. Disabled with back problems.   Right handed   Caffeine-1 cup daily   Social Drivers of Health   Financial Resource Strain: High Risk (05/30/2023)   Received from [facility]   Overall Financial Resource Strain (CARDIA)    Difficulty of Paying Living Expenses: Very hard  Food Insecurity: Food Insecurity Present (05/30/2023)   [facility] from [facility]   Hunger Vital Sign    Within the past 12 months, you worried that your food would run out before you got the money to buy more.: Often true    Within the past 12 months, the food you bought just didn't last and you didn't have money to get more.: Often true  Transportation Needs: No Transportation Needs (05/30/2023)   Received from [facility]    In the past 12 months, has  lack of transportation kept you from medical appointments or from getting medications?: No    Lack of Transportation (Non-Medical): No   Physical Activity: Not on file  Stress: Not on file  Social Connections: Not on file  Intimate Partner Violence: Not on file    PHYSICAL EXAM  GENERAL EXAM/CONSTITUTIONAL: Vitals:  Vitals:   12/08/23 0907  BP: 130/74  Weight: 209 lb (94.8 kg)  Height: 5' 5 (1.651 m)   Body mass index is 34.78 kg/m. Wt Readings from Last 3 Encounters:  12/08/23 209 lb (94.8 kg)  06/01/23 195 lb 8 oz (88.7 kg)  02/07/23 180 lb (81.6 kg)   Patient is in no distress; well developed, nourished and groomed; neck is supple  MUSCULOSKELETAL: Gait, strength, tone, movements noted in Neurologic exam below  NEUROLOGIC: MENTAL STATUS:      No data to display         awake, alert, oriented to person, place and time recent and remote memory intact normal attention and concentration language fluent, comprehension intact, naming intact fund of knowledge appropriate  CRANIAL NERVE:  2nd, 3rd, 4th, 6th - Visual fields full to confrontation, extraocular muscles intact, no nystagmus 5th - facial sensation symmetric 7th - facial strength symmetric 8th - hearing intact 9th - palate elevates symmetrically, uvula midline 11th - shoulder shrug symmetric 12th - tongue protrusion midline  MOTOR:  normal bulk and tone, full strength in the BUE, BLE  SENSORY:  normal and symmetric to light touch  COORDINATION:  finger-nose-finger, fine finger movements normal  GAIT/STATION:  Antalgic gait    DIAGNOSTIC DATA (LABS, IMAGING, TESTING) - I reviewed patient records, labs, notes, testing and imaging myself where available.  Lab Results  Component Value Date   WBC 6.7 02/10/2023   HGB 12.7 02/10/2023   HCT 40.3 02/10/2023   MCV 89.4 02/10/2023   PLT 311 02/10/2023      Component Value Date/Time   NA 141 02/10/2023 1833   K 3.4 (L) 02/10/2023 1833   CL 106 02/10/2023 1833   CO2 27 02/10/2023 1833   GLUCOSE 106 (H) 02/10/2023 1833   BUN 16 02/10/2023 1833   CREATININE 0.69 02/10/2023  1833   CREATININE 0.83 06/07/2018 0958   CALCIUM 8.9 02/10/2023 1833   PROT 7.2 01/12/2020 0337   ALBUMIN 4.1 01/12/2020 0337   AST 20 01/12/2020 0337   AST 13 (L) 06/07/2018 0958   ALT 13 01/12/2020 0337   ALT 9 06/07/2018 0958   ALKPHOS 90 01/12/2020 0337   BILITOT 0.9 01/12/2020 0337   BILITOT 0.4 06/07/2018 0958   GFRNONAA >60 02/10/2023 1833   GFRNONAA >60 06/07/2018 0958   GFRAA >60 01/12/2020 0337   GFRAA >60 06/07/2018 0958   No results found for: CHOL, HDL, LDLCALC, LDLDIRECT, TRIG, CHOLHDL No results found for: YHAJ8R Lab Results  Component Value Date   VITAMINB12 232 04/27/2018   Lab Results  Component Value Date   TSH 4.47 08/13/2010    MRI Brain with and without contrast 02/10/2023 Normal brain MRI. No acute intracranial abnormality identified.     ASSESSMENT AND PLAN  63 y.o. year old female with multiple medical conditions including migraine headaches, fibromyalgia, depression, chronic pain who is presenting for follow-up for vertigo, migraine and left leg pain.   For the vertigo, she tells me that her symptom are getting better since starting Valium .  For the migraines, with the propranolol  she has less intense migraine but she is still having daily headache.  She takes  5-6 sumatriptan  a month.  Advised her to continue current medications but also to add supplement such as magnesium  oxide, co-Q10 and riboflavin.  Advised her to continue following up with her doctors and orthopedics regarding her left lower leg pain.  Return in 6 to 8 months.    1. Chronic migraine w/o aura, not intractable, w/o stat migr   2. Vertigo      Patient Instructions  Continue propranolol  60 mg nightly Continue your other medications Continue with sumatriptan  as needed for headaches Consider supplement such as magnesium  oxide, 400 mg, riboflavin 400 mg.  1000 mg nightly to help with the headaches Continue follow-up with your doctors Return in 6 to 8 months or  sooner if worse.  No orders of the defined types were placed in this encounter.   Meds ordered this encounter  Medications   propranolol  ER (INDERAL  LA) 60 MG 24 hr capsule    Sig: Take 1 capsule (60 mg total) by mouth daily.    Dispense:  90 capsule    Refill:  3   SUMAtriptan  (IMITREX ) 50 MG tablet    Sig: Take 1 tablet (50 mg total) by mouth as needed for migraine. May repeat in 2 hours if headache persists or recurs.    Dispense:  10 tablet    Refill:  11    Return in about 1 year (around 12/07/2024).  I have spent a total of 45 minutes dedicated to this patient today, preparing to see patient, performing a medically appropriate examination and evaluation, ordering tests and/or medications and procedures, and counseling and educating the patient/family/caregiver; independently interpreting result and communicating results to the family/patient/caregiver; and documenting clinical information in the electronic medical record.   Pastor Falling, MD 12/08/2023, 2:50 PM  [facility] [address] [place], [place] [address] [phone]

## 2023-12-08 NOTE — Patient Instructions (Addendum)
 Continue propranolol  60 mg nightly Continue your other medications Continue with sumatriptan  as needed for headaches Consider supplement such as magnesium  oxide, 400 mg, riboflavin 400 mg.  1000 mg nightly to help with the headaches Continue follow-up with your doctors Return in 6 to 8 months or sooner if worse.

## 2023-12-26 ENCOUNTER — Inpatient Hospital Stay

## 2023-12-26 ENCOUNTER — Inpatient Hospital Stay: Admitting: Hematology & Oncology

## 2023-12-29 ENCOUNTER — Other Ambulatory Visit: Payer: Self-pay | Admitting: Neurology

## 2024-01-04 ENCOUNTER — Encounter: Payer: Self-pay | Admitting: Emergency Medicine

## 2024-01-04 ENCOUNTER — Emergency Department

## 2024-01-04 ENCOUNTER — Other Ambulatory Visit: Payer: Self-pay

## 2024-01-04 ENCOUNTER — Emergency Department: Admission: EM | Admit: 2024-01-04 | Discharge: 2024-01-04 | Disposition: A | Discharge status: Home / Self Care

## 2024-01-04 DIAGNOSIS — R1013 Epigastric pain: Secondary | ICD-10-CM | POA: Diagnosis not present

## 2024-01-04 DIAGNOSIS — R112 Nausea with vomiting, unspecified: Secondary | ICD-10-CM | POA: Diagnosis not present

## 2024-01-04 DIAGNOSIS — R197 Diarrhea, unspecified: Secondary | ICD-10-CM | POA: Diagnosis not present

## 2024-01-04 DIAGNOSIS — R110 Nausea: Secondary | ICD-10-CM | POA: Diagnosis present

## 2024-01-04 LAB — COMPREHENSIVE METABOLIC PANEL WITH GFR
ALT: 30 U/L (ref 0–44)
AST: 29 U/L (ref 15–41)
Albumin: 3.9 g/dL (ref 3.5–5.0)
Alkaline Phosphatase: 103 U/L (ref 38–126)
Anion gap: 11 (ref 5–15)
BUN: 13 mg/dL (ref 8–23)
CO2: 27 mmol/L (ref 22–32)
Calcium: 8.9 mg/dL (ref 8.9–10.3)
Chloride: 106 mmol/L (ref 98–111)
Creatinine, Ser: 0.83 mg/dL (ref 0.44–1.00)
GFR, Estimated: >60 mL/min (ref >60)
Glucose, Bld: 103 mg/dL — ABNORMAL HIGH (ref 70–99)
Potassium: 3.5 mmol/L (ref 3.5–5.1)
Sodium: 144 mmol/L (ref 135–145)
Total Bilirubin: 0.2 mg/dL (ref 0.0–1.2)
Total Protein: 7.3 g/dL (ref 6.5–8.1)

## 2024-01-04 LAB — CBC
HCT: 43.2 % (ref 36.0–46.0)
Hemoglobin: 13.9 g/dL (ref 12.0–15.0)
MCH: 27.6 pg (ref 26.0–34.0)
MCHC: 32.2 g/dL (ref 30.0–36.0)
MCV: 85.9 fL (ref 80.0–100.0)
Platelets: 274 K/uL (ref 150–400)
RBC: 5.03 MIL/uL (ref 3.87–5.11)
RDW: 13.5 % (ref 11.5–15.5)
WBC: 6.5 K/uL (ref 4.0–10.5)
nRBC: 0 % (ref 0.0–0.2)

## 2024-01-04 LAB — URINALYSIS, ROUTINE W REFLEX MICROSCOPIC
Bilirubin Urine: NEGATIVE
Glucose, UA: NEGATIVE mg/dL
Hgb urine dipstick: NEGATIVE
Ketones, ur: NEGATIVE mg/dL
Leukocytes,Ua: NEGATIVE
Nitrite: NEGATIVE
Protein, ur: NEGATIVE mg/dL
Specific Gravity, Urine: >1.046 — ABNORMAL HIGH (ref 1.005–1.030)
pH: 5 (ref 5.0–8.0)

## 2024-01-04 LAB — LIPASE, BLOOD: Lipase: 37 U/L (ref 11–51)

## 2024-01-04 MED ORDER — METOCLOPRAMIDE HCL 10 MG PO TABS
10.0000 mg | ORAL_TABLET | Freq: Three times a day (TID) | ORAL | 1 refills | Status: AC
Start: 2024-01-04 — End: 2025-01-03

## 2024-01-04 MED ORDER — LACTATED RINGERS IV BOLUS
1000.0000 mL | Freq: Once | INTRAVENOUS | Status: AC
  Administered 2024-01-04: 1000 mL via INTRAVENOUS

## 2024-01-04 MED ORDER — ONDANSETRON HCL 4 MG/2ML IJ SOLN
4.0000 mg | Freq: Once | INTRAMUSCULAR | Status: AC
  Administered 2024-01-04: 4 mg via INTRAVENOUS
  Filled 2024-01-04: qty 2

## 2024-01-04 MED ORDER — IOHEXOL 300 MG/ML  SOLN
100.0000 mL | Freq: Once | INTRAMUSCULAR | Status: AC | PRN
  Administered 2024-01-04: 100 mL via INTRAVENOUS

## 2024-01-04 MED ORDER — FENTANYL CITRATE PF 50 MCG/ML IJ SOSY
50.0000 ug | PREFILLED_SYRINGE | Freq: Once | INTRAMUSCULAR | Status: AC
  Administered 2024-01-04: 50 ug via INTRAVENOUS
  Filled 2024-01-04: qty 1

## 2024-01-04 NOTE — ED Provider Triage Note (Signed)
 Emergency Medicine Provider Triage Evaluation Note  [name] , a 63 y.o. female  was evaluated in triage.  Pt complains of nauseous.  According to the patient she saw her PCP who drew blood and found liver function test elevated, patient has history of 2 days of vomiting, diarrhea.  Patient does not know if she has blood in her stools, or the color of her stools.  Patient states she is unable to eat due to vomiting, patient has constant headache.  She was referred by her PCP to rule out cholecystitis or pancreatitis.  Patient denies fever  Review of Systems  Positive:  Negative:   Physical Exam  BP (!) 133/113 (BP Location: Left Arm)   Pulse 82   Temp 98.5 F (36.9 C) (Oral)   Resp 18   Ht 5' 4.5 (1.638 m)   Wt 93.4 kg   SpO2 100%   BMI 34.81 kg/m during triage patient was hypertensive, afebrile Gen:   Awake, no distress  Resp:  Normal effort  MSK:   Moves extremities without difficulty  Other:    Medical Decision Making  Medically screening exam initiated at 4:08 PM.  Appropriate orders placed.  CHEETARA HOGE was informed that the remainder of the evaluation will be completed by another provider, this initial triage assessment does not replace that evaluation, and the importance of remaining in the ED until their evaluation is complete.  Patient who presents with history of 2 days of vomit, diarrhea.  Patient was seen by her PCP who found elevated liver function test.  Patient was referred to rule out cholecystitis or pancreatitis.  Ordered CBC, CMP, lipase, ultrasound, abdominal CT    Janit Kast, PA-C 01/04/24 1611

## 2024-01-04 NOTE — ED Triage Notes (Signed)
 Patient to ED via POV from PCP for nausea and diarrhea. Denies abd pain. PT had elevated liver enzymes- hx of liver failure. PCP wanting gallbladder and pancreatis checked.

## 2024-01-04 NOTE — ED Provider Notes (Signed)
 [facility] Provider Note    Event Date/Time   First MD Initiated Contact with Patient 01/04/24 1715     (approximate)   History   Nausea   HPI  [name] is a 63 y.o. female with history of migraine, anemia, liver failure and as listed in EMR presents to the emergency department for treatment and evaluation of headache, nausea and diarrhea for the past 2 weeks after eating or drinking.  She was evaluated by primary care and advised that her liver enzymes were elevated.  She went back to her primary care provider today and was advised to come to the emergency department to check her gallbladder, liver, and pancreas.  She reports that she typically has 1 migraine per month but over the past 2 weeks has had at least 3.   Physical Exam    Vitals:   01/04/24 1605 01/04/24 1816  BP: (!) 133/113 (!) 134/94  Pulse: 82 72  Resp: 18 17  Temp: 98.5 F (36.9 C)   SpO2: 100% 96%    General: Awake, no distress.  CV:  Good peripheral perfusion.  Resp:  Normal effort.  Abd:  No distention. Epigastric tenderness. Other:     ED Results / Procedures / Treatments   Labs (all labs ordered are listed, but only abnormal results are displayed)  Labs Reviewed  COMPREHENSIVE METABOLIC PANEL WITH GFR - Abnormal; Notable for the following components:      Result Value   Glucose, Bld 103 (*)    All other components within normal limits  URINALYSIS, ROUTINE W REFLEX MICROSCOPIC - Abnormal; Notable for the following components:   Color, Urine YELLOW (*)    APPearance CLEAR (*)    Specific Gravity, Urine >1.046 (*)    All other components within normal limits  LIPASE, BLOOD  CBC     EKG  Not indicated.   RADIOLOGY  Image and radiology report reviewed and interpreted by me. Radiology report consistent with the same.  CT of the abdomen pelvis with contrast is negative for acute concerns.  PROCEDURES:  Critical Care performed:  No  Procedures   MEDICATIONS ORDERED IN ED:  Medications  iohexol  (OMNIPAQUE ) 300 MG/ML solution 100 mL (100 mLs Intravenous Contrast Given 01/04/24 1755)  lactated ringers  bolus 1,000 mL (0 mLs Intravenous Stopped 01/04/24 2052)  fentaNYL  (SUBLIMAZE ) injection 50 mcg (50 mcg Intravenous Given 01/04/24 1809)  ondansetron  (ZOFRAN ) injection 4 mg (4 mg Intravenous Given 01/04/24 1839)     IMPRESSION / MDM / ASSESSMENT AND PLAN / ED COURSE   I have reviewed the triage note and vital signs. Vital signs stable--diastolic hypertension noted, repeat vital signs requested.   Differential diagnosis includes, but is not limited to, pancreatitis, acute cholecystitis, gastritis, bowel obstruction, ileus  Patient's presentation is most consistent with acute presentation with potential threat to life or bodily function.  63 year old female presenting to the emergency department for treatment and evaluation of abdominal tenderness with nausea, vomiting, diarrhea for the past 2 weeks with increased in frequency of migraine headaches.  See HPI for further details.  On exam, she has some epigastric tenderness that radiates over into the right upper quadrant.  She reports that lab studies at PCP indicated elevation in her liver enzymes.  Upon chart review, I am unable to see results however today on our labs there is no abnormality in LFTs, alk phos, or total bilirubin.  Plan will be to give fluids, recheck vital signs, and give fentanyl   to help with the abdominal pain and headache.    Repeat vital signs are better however she is still somewhat hypertensive.  CT scan of the abdomen and pelvis is negative for acute concerns.  Urinalysis is normal.  Plan will be to discharge her home with prescription for Reglan  and have her follow-up with a GI specialist/PCP if not improving over the next few days.       FINAL CLINICAL IMPRESSION(S) / ED DIAGNOSES   Final diagnoses:  Nausea vomiting and diarrhea      Rx / DC Orders   ED Discharge Orders          Ordered    metoCLOPramide  (REGLAN ) 10 MG tablet  3 times daily with meals        01/04/24 2041             Note:  This document was prepared using Dragon voice recognition software and may include unintentional dictation errors.   Herlinda Kirk NOVAK, FNP 01/05/24 0001    Clarine Ozell LABOR, MD 01/06/24 [phone]

## 2024-01-05 ENCOUNTER — Inpatient Hospital Stay: Attending: Hematology & Oncology

## 2024-01-05 ENCOUNTER — Other Ambulatory Visit: Payer: Self-pay

## 2024-01-05 ENCOUNTER — Encounter: Payer: Self-pay | Admitting: Hematology & Oncology

## 2024-01-05 ENCOUNTER — Inpatient Hospital Stay (HOSPITAL_BASED_OUTPATIENT_CLINIC_OR_DEPARTMENT_OTHER): Admitting: Hematology & Oncology

## 2024-01-05 VITALS — BP 132/86 | HR 71 | Temp 98.2°F | Resp 18 | Ht 64.5 in | Wt 206.0 lb

## 2024-01-05 DIAGNOSIS — D649 Anemia, unspecified: Secondary | ICD-10-CM

## 2024-01-05 DIAGNOSIS — Z79899 Other long term (current) drug therapy: Secondary | ICD-10-CM | POA: Diagnosis not present

## 2024-01-05 DIAGNOSIS — Z800 Family history of malignant neoplasm of digestive organs: Secondary | ICD-10-CM | POA: Diagnosis not present

## 2024-01-05 DIAGNOSIS — N939 Abnormal uterine and vaginal bleeding, unspecified: Secondary | ICD-10-CM

## 2024-01-05 DIAGNOSIS — E8889 Other specified metabolic disorders: Secondary | ICD-10-CM

## 2024-01-05 DIAGNOSIS — N289 Disorder of kidney and ureter, unspecified: Secondary | ICD-10-CM

## 2024-01-05 DIAGNOSIS — M898X6 Other specified disorders of bone, lower leg: Secondary | ICD-10-CM | POA: Diagnosis not present

## 2024-01-05 DIAGNOSIS — D72829 Elevated white blood cell count, unspecified: Secondary | ICD-10-CM

## 2024-01-05 DIAGNOSIS — R197 Diarrhea, unspecified: Secondary | ICD-10-CM

## 2024-01-05 DIAGNOSIS — Z862 Personal history of diseases of the blood and blood-forming organs and certain disorders involving the immune mechanism: Secondary | ICD-10-CM

## 2024-01-05 DIAGNOSIS — F3289 Other specified depressive episodes: Secondary | ICD-10-CM

## 2024-01-05 LAB — IRON AND IRON BINDING CAPACITY (CC-WL,HP ONLY)
Iron: 52 ug/dL (ref 28–170)
Saturation Ratios: 14 % (ref 10.4–31.8)
TIBC: 377 ug/dL (ref 250–450)
UIBC: 325 ug/dL

## 2024-01-05 LAB — CMP (CANCER CENTER ONLY)
ALT: 27 U/L (ref 0–44)
AST: 24 U/L (ref 15–41)
Albumin: 4.3 g/dL (ref 3.5–5.0)
Alkaline Phosphatase: 123 U/L (ref 38–126)
Anion gap: 11 (ref 5–15)
BUN: 12 mg/dL (ref 8–23)
CO2: 26 mmol/L (ref 22–32)
Calcium: 9 mg/dL (ref 8.9–10.3)
Chloride: 105 mmol/L (ref 98–111)
Creatinine: 0.69 mg/dL (ref 0.44–1.00)
GFR, Estimated: >60 mL/min (ref >60)
Glucose, Bld: 88 mg/dL (ref 70–99)
Potassium: 3.6 mmol/L (ref 3.5–5.1)
Sodium: 143 mmol/L (ref 135–145)
Total Bilirubin: 0.3 mg/dL (ref 0.0–1.2)
Total Protein: 7.3 g/dL (ref 6.5–8.1)

## 2024-01-05 LAB — CBC WITH DIFFERENTIAL (CANCER CENTER ONLY)
Abs Immature Granulocytes: 0.04 K/uL (ref 0.00–0.07)
Basophils Absolute: 0.1 K/uL (ref 0.0–0.1)
Basophils Relative: 1 %
Eosinophils Absolute: 0.2 K/uL (ref 0.0–0.5)
Eosinophils Relative: 2 %
HCT: 41.3 % (ref 36.0–46.0)
Hemoglobin: 13.2 g/dL (ref 12.0–15.0)
Immature Granulocytes: 1 %
Lymphocytes Relative: 28 %
Lymphs Abs: 2.1 K/uL (ref 0.7–4.0)
MCH: 27.6 pg (ref 26.0–34.0)
MCHC: 32 g/dL (ref 30.0–36.0)
MCV: 86.2 fL (ref 80.0–100.0)
Monocytes Absolute: 1 K/uL (ref 0.1–1.0)
Monocytes Relative: 14 %
Neutro Abs: 4 K/uL (ref 1.7–7.7)
Neutrophils Relative %: 54 %
Platelet Count: 269 K/uL (ref 150–400)
RBC: 4.79 MIL/uL (ref 3.87–5.11)
RDW: 13.4 % (ref 11.5–15.5)
WBC Count: 7.3 K/uL (ref 4.0–10.5)
nRBC: 0 % (ref 0.0–0.2)

## 2024-01-05 LAB — C-REACTIVE PROTEIN: CRP: 0.9 mg/dL (ref <1.0)

## 2024-01-05 LAB — LACTATE DEHYDROGENASE: LDH: 215 U/L — ABNORMAL HIGH (ref 98–192)

## 2024-01-05 LAB — TSH: TSH: 2.65 u[IU]/mL (ref 0.350–4.500)

## 2024-01-05 LAB — SAVE SMEAR(SSMR), FOR PROVIDER SLIDE REVIEW

## 2024-01-05 LAB — VITAMIN B12: Vitamin B-12: 278 pg/mL (ref 180–914)

## 2024-01-05 LAB — URIC ACID: Uric Acid, Serum: 3 mg/dL (ref 2.5–7.1)

## 2024-01-05 LAB — FERRITIN: Ferritin: 69 ng/mL (ref 11–307)

## 2024-01-05 NOTE — Progress Notes (Signed)
 Referral MD  Reason for Referral: Leg pain and fatigue  Chief Complaint  Patient presents with   New Patient (Initial Visit)  : I was told to see you for a bone biopsy.  HPI: Ms. [name] is actually well-known to me.  I have seen her in the past.  I 5 not seen her for 5-1/2 years.  The last time that we saw her was back in January 2020.  At that time, she had iron deficiency.  We had given her some iron.  Since then, she has been doing okay.  However, she has been having problems with a lot of bone pain.  This is mostly in the left lower leg.  This really has caused her a lot of discomfort.  She has seen numerous doctors.  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  She has had multiple scans.  She has had bone scans.  She has had CT scans.  She has had MRIs.  I do not think anything really has been all that definitive.  She has lost weight.  I think she is on Mounjaro.  She says that her right arm also hurts now.  She has had no rashes.  She does feel fatigued.  She does get tired easily.  There is been a lot of diarrhea.  She says she has not been able to really eat because of diarrhea.  Every time she eats, it goes right through her..  She does not have diabetes.  As far she knows there is no thyroid  trouble.  Again, she was told to come see us  to get a biopsy.  She has had no problems with COVID.  She does not smoke.  Overall, I would say that her performance status is probably ECOG 2.    Past Medical History:  Diagnosis Date   Anemia    Anxiety    Arthritis    multiple areas   Bone marrow disease    DX many years ago - pt unsure of name and unable to find info on type of disease   Complication of anesthesia 2011   slow to awaken after hysterectomy   Depression    Dyspnea    if does not take albuterol  she states she can not breath    Family history of adverse reaction to anesthesia    pts mother  has nausea and vomiting; pts sons become aggressive and have hallicunations   Fibromyalgia    H/O iron deficiency anemia    Headache    MIGRAINES   History of blood transfusion    day pt was born   History of bronchitis    Hx of pneumothorax    X2 IN PAST   Kidney disease    only has one functioning kidney - no problems  x 23 yrs   Liver failure, acute 5 years ago   cause unknown, after hysterectomy   Neuropathy    Numbness    MULTIPLE AREAS - from all the surgeries I've had   PONV (postoperative nausea and vomiting)   :   Past Surgical History:  Procedure Laterality Date   ABDOMINAL HYSTERECTOMY     BACK SURGERY     multiple   CERVICAL  SPINE SURG     SEVERAL TIMES   CERVICAL DISCECTOMY     with fusion   DILATION AND CURETTAGE OF UTERUS     FOOT SURGERY  1989  HERNIA REPAIR  2011   KNEE ARTHROSCOPY WITH DRILLING/MICROFRACTURE Right 03/13/2014   Procedure: RIGHT MICROFRACTURE TECHNIQUE;  Surgeon: Tanda DELENA Heading, MD;  Location: [facility];  Service: Orthopedics;  Laterality: Right;   KNEE ARTHROSCOPY WITH MEDIAL MENISECTOMY Right 03/13/2014   Procedure: RIGHT KNEE ARTHROSCOPY WITH MEDIAL FEMORAL CONDYLE REPAIR;  Surgeon: Tanda DELENA Heading, MD;  Location: [facility];  Service: Orthopedics;  Laterality: Right;   KNEE ARTHROSCOPY WITH MEDIAL PATELLAR FEMORAL LIGAMENT RECONSTRUCTION Right 03/13/2014   Procedure: ABRASION CHONDROPLASTY OF MEDIAL FEMORAL;  Surgeon: Tanda DELENA Heading, MD;  Location: [facility];  Service: Orthopedics;  Laterality: Right;   left knee arthroscopy     polyp removal  2010   uterine   right leg surgery      secondary to trauma   SHOULDER SURGERY     Bilaterally X9 SURGERIES   SYNOVECTOMY Right 03/13/2014   Procedure: SYNOVECTOMY OF THE SUPRAPATELLA POUCH;  Surgeon: Tanda DELENA Heading, MD;  Location: [facility];  Service: Orthopedics;  Laterality: Right;   TONSILLECTOMY     TOTAL KNEE ARTHROPLASTY Right 07/30/2014   Procedure: RIGHT TOTAL KNEE ARTHROPLASTY;   Surgeon: Tanda Heading, MD;  Location: [facility];  Service: Orthopedics;  Laterality: Right;   TOTAL KNEE REVISION WITH SCAR DEBRIDEMENT/PATELLA REVISION WITH POLY EXCHANGE Right 04/05/2016   Procedure: OPEN SCAR DEBRIDEMENT WITH POLY EXCHANGE reviosion of patella;  Surgeon: Donnice Car, MD;  Location: [facility];  Service: Orthopedics;  Laterality: Right;  :   Current Outpatient Medications:    albuterol  (PROVENTIL ) 2 MG tablet, Take 4 mg by mouth every 8 (eight) hours., Disp: , Rfl:    celecoxib  (CELEBREX ) 200 MG capsule, Take 1 capsule (200 mg total) by mouth every 12 (twelve) hours. Take along with Pepcid  to help avoid GI upset. (Patient taking differently: Take 200 mg by mouth in the morning. Take along with Pepcid  to help avoid GI upset.), Disp: 60 capsule, Rfl: 0   diazepam  (VALIUM ) 5 MG tablet, Take 1 tablet (5 mg total) by mouth every 6 (six) hours as needed (vertigo). (Patient taking differently: Take 2 mg by mouth every 6 (six) hours as needed (vertigo).), Disp: 20 tablet, Rfl: 0   EPIPEN  2-PAK 0.3 MG/0.3ML SOAJ injection, Inject 0.3 mg as directed as needed (allergic reaction). , Disp: , Rfl:    escitalopram (LEXAPRO) 10 MG tablet, Take 10 mg by mouth in the morning., Disp: , Rfl:    ferrous sulfate  325 (65 FE) MG tablet, Take 325 mg by mouth in the morning., Disp: , Rfl:    ibuprofen  (ADVIL ) 200 MG tablet, Take 400 mg by mouth every 8 (eight) hours as needed (pain.)., Disp: , Rfl:    Menthol , Topical Analgesic, (ABSORBINE PLUS JR) 6.5 % PTCH, Place 1 patch onto the skin daily as needed (pain.)., Disp: , Rfl:    methocarbamol  (ROBAXIN ) 500 MG tablet, Take 500 mg by mouth 2 (two) times daily as needed for muscle spasms., Disp: , Rfl:    metoCLOPramide  (REGLAN ) 10 MG tablet, Take 1 tablet (10 mg total) by mouth 3 (three) times daily with meals., Disp: 90 tablet, Rfl: 1   morphine  (MSIR) 15 MG tablet, Take 15 mg by mouth every 8 (eight) hours as needed for severe pain., Disp: , Rfl:     ondansetron  (ZOFRAN ) 8 MG tablet, Take 8 mg by mouth 3 (three) times daily as needed for nausea or vomiting., Disp: , Rfl:    propranolol  ER (INDERAL  LA) 60 MG 24 hr capsule, Take 1  capsule (60 mg total) by mouth daily., Disp: 90 capsule, Rfl: 3   rOPINIRole  (REQUIP ) 1 MG tablet, Take 1 mg by mouth at bedtime., Disp: , Rfl:    rosuvastatin (CRESTOR) 5 MG tablet, Take 5 mg by mouth in the morning., Disp: , Rfl:    Semaglutide, 2 MG/DOSE, (OZEMPIC, 2 MG/DOSE,) 8 MG/3ML SOPN, Inject 2 mg into the skin every Thursday., Disp: , Rfl:    SUMAtriptan  (IMITREX ) 50 MG tablet, TAKE 1 TABLET BY MOUTH AS NEEDED FOR MIGRAINE. MAY REPEAT IN 2 HOURS IF HEADACHE PERSISTS OR RECURS., Disp: 10 tablet, Rfl: 1:  :   Allergies  Allergen Reactions   Fish Allergy Anaphylaxis    To all fish, not just shellfish.  Cannot tolerate even the smell of seafood without her airway swelling/closing.   Other Shortness Of Breath    Whipped cream and pudding   Nortriptyline Other (See Comments)    hallucinations   Cyclobenzaprine    Tylenol  [Acetaminophen ] Other (See Comments)    Cannot take due to past liver failure   Bee Venom Hives   Flexeril [Cyclobenzaprine Hcl] Other (See Comments)    Intense anger  :   Family History  Problem Relation Age of Onset   Heart disease Mother    Heart disease Father    Colon cancer Father    Heart disease Maternal Grandfather    Heart disease Maternal Grandmother    Heart disease Paternal Grandfather    Heart disease Paternal Grandmother   :   Social History   Socioeconomic History   Marital status: Divorced    Spouse name: Not on file   Number of children: 2   Years of education: Not on file   Highest education level: Not on file  Occupational History   Occupation: disabled    Comment: back problems  Tobacco Use   Smoking status: Never   Smokeless tobacco: Never  Vaping Use   Vaping status: Never Used  Substance and Sexual Activity   Alcohol use: No   Drug use:  No   Sexual activity: Not on file  Other Topics Concern   Not on file  Social History Narrative   Divorced and lives with her youngest son. Disabled with back problems.   Right handed   Caffeine-1 cup daily   Social Drivers of Health   Financial Resource Strain: High Risk (05/30/2023)   [facility]   Overall Financial Resource Strain (CARDIA)    Difficulty of Paying Living Expenses: Very hard  Food Insecurity: Food Insecurity Present (05/30/2023)   Received from [facility]   Hunger Vital Sign    Within the past 12 months, you worried that your food would run out before you got the money to buy more.: Often true    Within the past 12 months, the food you bought just didn't last and you didn't have money to get more.: Often true  Transportation Needs: No Transportation Needs (05/30/2023)   Received from [facility] - Transportation    In the past 12 months, has lack of transportation kept you from medical appointments or from getting medications?: No    Lack of Transportation (Non-Medical): No  Physical Activity: Not on file  Stress: Not on file  Social Connections: Not on file  Intimate Partner Violence: Not on file  :  Review of Systems  Constitutional:  Positive for malaise/fatigue and weight loss.  HENT: Negative.  Eyes: Negative.   Respiratory: Negative.    Cardiovascular:  Positive for palpitations.  Gastrointestinal:  Positive for diarrhea and nausea.  Genitourinary: Negative.   Musculoskeletal:  Positive for joint pain.  Skin: Negative.   Neurological:  Positive for weakness.  Endo/Heme/Allergies: Negative.   Psychiatric/Behavioral: Negative.       Exam:  Vital signs show temperature of 98.2.  Pulse 71.  Blood pressure 132/86.  Weight is 206 pounds.  @IPVITALS @ Physical Exam Vitals reviewed.  HENT:     Head: Normocephalic and atraumatic.  Eyes:     Pupils: Pupils are equal,  round, and reactive to light.  Cardiovascular:     Rate and Rhythm: Normal rate and regular rhythm.     Heart sounds: Normal heart sounds.  Pulmonary:     Effort: Pulmonary effort is normal.     Breath sounds: Normal breath sounds.  Abdominal:     General: Bowel sounds are normal.     Palpations: Abdomen is soft.  Musculoskeletal:        General: No tenderness or deformity. Normal range of motion.     Cervical back: Normal range of motion.  Lymphadenopathy:     Cervical: No cervical adenopathy.  Skin:    General: Skin is warm and dry.     Findings: No erythema or rash.  Neurological:     Mental Status: She is alert and oriented to person, place, and time.  Psychiatric:        Behavior: Behavior normal.        Thought Content: Thought content normal.        Judgment: Judgment normal.     Recent Labs    01/04/24 1608 01/05/24 1407  WBC 6.5 7.3  HGB 13.9 13.2  HCT 43.2 41.3  PLT 274 269    Recent Labs    01/04/24 1608 01/05/24 1407  NA 144 143  K 3.5 3.6  CL 106 105  CO2 27 26  GLUCOSE 103* 88  BUN 13 12  CREATININE 0.83 0.69  CALCIUM 8.9 9.0    Blood smear review: None  Pathology: None    Assessment and Plan: Ms. Dittrich is a nice 63 year old white female.  She has a lot of bone pain.  This is mostly in her left lower leg.  On exam, really cannot elicit much.  Again, I am a say that Erdheim-Chester is a very rare disease.  I can imagine that this may not be difficult to diagnose.  I think it probably worthwhile to get a PET scan on her.  I think a PET scan would certainly show us  if she does have bone inflammation.  If so, then we might be able to see about getting a biopsy of one of the bones that could be inflamed.  I also think that a cardiac MRI may not be a bad idea.  This could show any kind of infiltrative disease that might be causing some of the fatigue that she has.  This is certainly not straightforward by any means.  Again, she is seeing  multiple doctors.  No one has helped her to date.  Hopefully, we might be able to help her out.  Once we get the results back from our studies, we will give her a call and see about getting her back to see us .

## 2024-01-06 ENCOUNTER — Ambulatory Visit: Payer: Self-pay | Admitting: Hematology & Oncology

## 2024-01-06 NOTE — Telephone Encounter (Signed)
 Advised via MyChart.

## 2024-01-06 NOTE — Telephone Encounter (Signed)
-----   Message from Maude JONELLE Crease sent at 01/06/2024  3:13 PM EDT ----- Please call and let her know that the labs all look pretty good.  He will be interesting to see what the PET scan shows.  Jeralyn ----- Message ----- From: Rebecka, Lab In [place] Sent: 01/05/2024   2:11 PM EDT To: Maude JONELLE Crease, MD

## 2024-01-07 ENCOUNTER — Other Ambulatory Visit: Payer: Self-pay | Admitting: Medical Genetics

## 2024-01-09 ENCOUNTER — Encounter (HOSPITAL_COMMUNITY)
Admission: RE | Admit: 2024-01-09 | Discharge: 2024-01-09 | Disposition: A | Discharge status: Home / Self Care | Source: Ambulatory Visit | Attending: Hematology & Oncology | Admitting: Hematology & Oncology

## 2024-01-09 DIAGNOSIS — R634 Abnormal weight loss: Secondary | ICD-10-CM | POA: Diagnosis not present

## 2024-01-09 DIAGNOSIS — E8889 Other specified metabolic disorders: Secondary | ICD-10-CM | POA: Insufficient documentation

## 2024-01-09 DIAGNOSIS — R590 Localized enlarged lymph nodes: Secondary | ICD-10-CM | POA: Insufficient documentation

## 2024-01-09 DIAGNOSIS — M898X9 Other specified disorders of bone, unspecified site: Secondary | ICD-10-CM | POA: Diagnosis not present

## 2024-01-09 LAB — GLUCOSE, CAPILLARY: Glucose-Capillary: 105 mg/dL — ABNORMAL HIGH (ref 70–99)

## 2024-01-09 MED ORDER — FLUDEOXYGLUCOSE F - 18 (FDG) INJECTION
10.2000 | Freq: Once | INTRAVENOUS | Status: AC | PRN
  Administered 2024-01-09: 10.2 via INTRAVENOUS

## 2024-01-10 ENCOUNTER — Encounter (HOSPITAL_COMMUNITY): Payer: Self-pay

## 2024-01-12 ENCOUNTER — Other Ambulatory Visit: Payer: Self-pay | Admitting: Hematology & Oncology

## 2024-01-12 ENCOUNTER — Ambulatory Visit (HOSPITAL_COMMUNITY)
Admission: RE | Admit: 2024-01-12 | Discharge: 2024-01-12 | Disposition: A | Discharge status: Home / Self Care | Source: Ambulatory Visit | Attending: Hematology & Oncology | Admitting: Hematology & Oncology

## 2024-01-12 DIAGNOSIS — E8889 Other specified metabolic disorders: Secondary | ICD-10-CM | POA: Diagnosis present

## 2024-01-12 DIAGNOSIS — I361 Nonrheumatic tricuspid (valve) insufficiency: Secondary | ICD-10-CM | POA: Diagnosis not present

## 2024-01-12 MED ORDER — GADOBUTROL 1 MMOL/ML IV SOLN
10.0000 mL | Freq: Once | INTRAVENOUS | Status: AC | PRN
  Administered 2024-01-12: 10 mL via INTRAVENOUS

## 2024-01-13 NOTE — Telephone Encounter (Signed)
-----   Message from [name] sent at 01/13/2024  9:09 AM EDT ----- Please call and let her know that the cardiac MRI looks okay.  She has good heart function.  Nothing looks like it is infiltrating the heart.  This is excellent.  Jeralyn ----- Message ----- From: Rebecka, Rad Results In Sent: 01/12/2024  11:18 PM EDT To: Tara JONELLE Crease, MD

## 2024-01-19 ENCOUNTER — Other Ambulatory Visit: Payer: Self-pay

## 2024-02-06 ENCOUNTER — Telehealth: Payer: Self-pay

## 2024-02-06 NOTE — Telephone Encounter (Signed)
 Patient aware that she is scheduled to see Alan Coombs, PA-C on 02-14-24 at 8:40am.  EGD will be scheduled at that time.  Patient agreed to plan and verbalized understanding.  No further questions.

## 2024-02-06 NOTE — Telephone Encounter (Signed)
-----   Message from Sandor LULLA Flatter sent at 02/06/2024  4:30 PM EDT ----- [name], No problem, we can certainly get her in and figure out what is going on.  Nat, can you please schedule expedited OV with me or one of the pod B APP's and they can staff it with me directly.  Will need to look for expedited spot in the [facility] for EGD as well.  Thanks! ----- Message ----- From: Timmy Maude SAUNDERS, MD Sent: 02/06/2024   2:21 PM EDT To: Sandor Flatter LULLA, DO  [name]: I was wondering if you might be able to do a upper endoscopy on my patient.  I am not sure what is wrong.  However, on the PET scan it looks like there might be some activity in the stomach.  She has never had a endoscopy before.  She has never seen a gastroenterologist before.  I know you guys are busy.  As always thank you for helping out my patients.  [name]

## 2024-02-07 ENCOUNTER — Encounter: Admitting: Gastroenterology

## 2024-02-13 NOTE — Progress Notes (Unsigned)
 02/14/2024 [name] [id number] [birth year]  Referring provider: Loring Tanda Burch, * Primary GI doctor: Dr. Shila  ASSESSMENT AND PLAN:  Abnormal PET scan 01/09/2024 single hypermetabolic periportal lymph node and mild metabolic activity through the gastric pyloric region 2011 EGD colonoscopy with Dr. Dianna at [facility] show extremely poor prep Attempted colonoscopy 1-2 years ago with [facility] but states she could not tolerate the prep Denies GERD, dysphagia, melena, AB pain She does have nausea, zofran  stopped working, on reglan  with help, she is on celebrex  daily 200 mg once daily, on ozempic x 1-2 months Uncertain if patient could have lympoma, GIST, gastric tumor or if this is false positive activity  -Has not had ozempic in over 7 days, missed dose this past Sunday.  -Will schedule for EGD tomorrow with Dr. Nandigam for evaluation of abnormal PET with weight loss, etc.  I discussed risks of EGD with patient today, including risk of sedation, bleeding or perforation.  Patient provides understanding and gave verbal consent to proceed. -Patient also needs a colonoscopy for diarrhea/screening however will not delay EGD, suggest 2 day prep and try to get pills, patient could not handle liquid prep last visit -Consider GES with symptoms  History of Anemia 2011 History of iron infusions with Dr. Timmy, last time 2020 01/05/2024  HGB 13.2 MCV 86.2 Platelets 269 01/05/2024 Iron 52 Ferritin 69 B12 278 Recent Labs    01/04/24 1608 01/05/24 1407  HGB 13.9 13.2  No current IDA, suggest starting B12 sublingual  Diarrhea with weight loss, post prandial On ozempic 2024 CTAP W0 colonic diverticulosis, large amount of stool throughout the colon CTAP W8/20/2025 unremarkable - get sed rate, fecal fat, pancreatic elastase, celiac testing - get KUB with previous CT showing large stool burden -Patient also needs a colonoscopy for diarrhea/screening however will  not delay EGD, suggest 2 day prep and try to get pills, patient could not handle liquid prep last visit  History of liver failure, unknown cause After hysterectomy in 2012    Latest Ref Rng & Units 01/05/2024    2:07 PM 01/04/2024    4:08 PM 01/12/2020    3:37 AM  Hepatic Function  Total Protein 6.5 - 8.1 g/dL 7.3  7.3  7.2   Albumin 3.5 - 5.0 g/dL 4.3  3.9  4.1   AST 15 - 41 U/L 24  29  20    ALT 0 - 44 U/L 27  30  13    Alk Phosphatase 38 - 126 U/L 123  103  90   Total Bilirubin 0.0 - 1.2 mg/dL 0.3  0.2  0.9    Platelets 269  Unremarkable LFTs, continue to monitor  Morbid obesity  Body mass index is 35.32 kg/m.  -Patient has been advised to make an attempt to improve diet and exercise patterns to aid in weight loss. -Recommended diet heavy in fruits and veggies and low in animal meats, cheeses, and dairy products, appropriate calorie intake  Patient Care Team: Tara Tanda Mae, MD as PCP - General (Family Medicine) Leva Rush, MD as Referring Physician (Obstetrics and Gynecology)  HISTORY OF PRESENT ILLNESS: 63 y.o. female with a past medical history listed below presents for evaluation of abnormal PET scan.   Patient follows with Dr. 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  01/12/2024 MR cardiac ejection fraction 50%, no myocardial scar.  Discussed the use of AI scribe software for clinical note transcription with the patient, who  gave verbal consent to proceed.  History of Present Illness   [name] is a 63 year old female with anemia and liver issues who presents with severe bone pain and gastrointestinal symptoms.  She experiences severe bone pain that began in the left anterior region and has since spread to her arm. The pain is described as 'weird' and significantly impacts her life, causing her to be up all night. She has consulted multiple specialists including an orthopedic, neurosurgeon, neurologist, and  endocrinologist. A complete bone scan was performed, and she has undergone various imaging studies including MRIs and PET scans. The PET scan showed activity near a lymph node by the liver and in the stomach region.  She has a history of iron deficiency anemia since birth and has been receiving iron infusions, although none since COVID. Her B12 levels were noted to be low at 278 in August. She experiences severe fatigue, stating she can stay in bed all day and still feel exhausted.  She reports gastrointestinal symptoms including diarrhea that occurs within 30 minutes of eating, particularly with vegetables, leading to weight loss from 220 to 205 pounds. She experiences nausea, which she attributes to her medications, and takes regular nausea medication. No heartburn, reflux, trouble swallowing, or dark stools.  Her past medical history includes liver failure between 2012 and 2016, following a hysterectomy in 2012. She has not had a colonoscopy since 2011 due to difficulty tolerating the preparation solution and insurance issues with alternative preparations.  Current medications include Celebrex  once daily, ondansetron  for nausea, and metoclopramide  as needed. She has been on Ozempic for weight management, which she resumed about a month and a half ago after a previous two and a half year course. She reports no nausea with Ozempic this time, unlike her previous experience.        She  reports that she has never smoked. She has never used smokeless tobacco. She reports that she does not drink alcohol and does not use drugs.  RELEVANT GI HISTORY, IMAGING AND LABS: Results   LABS B12: 278 (12/2023)  RADIOLOGY MRI of the heart: No scarring PET scan: Increased activity near lymph node adjacent to the liver and in the gastric region     01/09/2024 PET scan 1. No evidence malignancy on whole-body FDG PET scan. 2. Single hypermetabolic periportal lymph node and mild metabolic activity through the  gastric pyloric region. Favor inflammatory activity. If continued clinical concern, consider upper endoscopy. No abnormal findings on comparison diagnostic CT scan from 01/04/2019  CBC    Component Value Date/Time   WBC 7.3 01/05/2024 1407   WBC 6.5 01/04/2024 1608   RBC 4.79 01/05/2024 1407   HGB 13.2 01/05/2024 1407   HGB 11.6 09/21/2010 0836   HCT 41.3 01/05/2024 1407   HCT 36.3 09/21/2010 0836   PLT 269 01/05/2024 1407   PLT 281 09/21/2010 0836   MCV 86.2 01/05/2024 1407   MCV 78 (L) 09/21/2010 0836   MCH 27.6 01/05/2024 1407   MCHC 32.0 01/05/2024 1407   RDW 13.4 01/05/2024 1407   RDW 19.0 (H) 09/21/2010 0836   LYMPHSABS 2.1 01/05/2024 1407   LYMPHSABS 2.2 09/21/2010 0836   MONOABS 1.0 01/05/2024 1407   EOSABS 0.2 01/05/2024 1407   EOSABS 0.1 09/21/2010 0836   BASOSABS 0.1 01/05/2024 1407   BASOSABS 0.0 09/21/2010 0836   Recent Labs    01/04/24 1608 01/05/24 1407  HGB 13.9 13.2    CMP     Component  Value Date/Time   NA 143 01/05/2024 1407   K 3.6 01/05/2024 1407   CL 105 01/05/2024 1407   CO2 26 01/05/2024 1407   GLUCOSE 88 01/05/2024 1407   BUN 12 01/05/2024 1407   CREATININE 0.69 01/05/2024 1407   CALCIUM 9.0 01/05/2024 1407   PROT 7.3 01/05/2024 1407   ALBUMIN 4.3 01/05/2024 1407   AST 24 01/05/2024 1407   ALT 27 01/05/2024 1407   ALKPHOS 123 01/05/2024 1407   BILITOT 0.3 01/05/2024 1407   GFRNONAA >60 01/05/2024 1407   GFRAA >60 01/12/2020 0337   GFRAA >60 06/07/2018 0958      Latest Ref Rng & Units 01/05/2024    2:07 PM 01/04/2024    4:08 PM 01/12/2020    3:37 AM  Hepatic Function  Total Protein 6.5 - 8.1 g/dL 7.3  7.3  7.2   Albumin 3.5 - 5.0 g/dL 4.3  3.9  4.1   AST 15 - 41 U/L 24  29  20    ALT 0 - 44 U/L 27  30  13    Alk Phosphatase 38 - 126 U/L 123  103  90   Total Bilirubin 0.0 - 1.2 mg/dL 0.3  0.2  0.9       Latest Ref Rng & Units 01/03/2009    4:24 PM  Hepatitis C  HCV Quanitative <43 IU/mL Reference range: <43 (NOTE) No  detectable level of HCV RNA.    HCV Quanitative Log <1.63 log 10 NOT CALC (NOTE)  This test utilizes the [place]  FDA approved Roche HCV Test Kit by RT-PCR.     Current Medications:   Current Outpatient Medications (Endocrine & Metabolic):    Semaglutide, 2 MG/DOSE, (OZEMPIC, 2 MG/DOSE,) 8 MG/3ML SOPN, Inject 2 mg into the skin every Thursday.  Current Outpatient Medications (Cardiovascular):    EPIPEN  2-PAK 0.3 MG/0.3ML SOAJ injection, Inject 0.3 mg as directed as needed (allergic reaction).    propranolol  ER (INDERAL  LA) 60 MG 24 hr capsule, Take 1 capsule (60 mg total) by mouth daily.   rosuvastatin (CRESTOR) 5 MG tablet, Take 5 mg by mouth in the morning.  Current Outpatient Medications (Respiratory):    albuterol  (PROVENTIL ) 2 MG tablet, Take 4 mg by mouth every 8 (eight) hours.  Current Outpatient Medications (Analgesics):    celecoxib  (CELEBREX ) 200 MG capsule, Take 1 capsule (200 mg total) by mouth every 12 (twelve) hours. Take along with Pepcid  to help avoid GI upset. (Patient taking differently: Take 200 mg by mouth in the morning. Take along with Pepcid  to help avoid GI upset.)   ibuprofen  (ADVIL ) 200 MG tablet, Take 400 mg by mouth every 8 (eight) hours as needed (pain.).   morphine  (MSIR) 15 MG tablet, Take 15 mg by mouth every 8 (eight) hours as needed for severe pain.   SUMAtriptan  (IMITREX ) 50 MG tablet, TAKE 1 TABLET BY MOUTH AS NEEDED FOR MIGRAINE. MAY REPEAT IN 2 HOURS IF HEADACHE PERSISTS OR RECURS.  Current Outpatient Medications (Hematological):    ferrous sulfate  325 (65 FE) MG tablet, Take 325 mg by mouth in the morning.  Current Outpatient Medications (Other):    diazepam  (VALIUM ) 5 MG tablet, Take 1 tablet (5 mg total) by mouth every 6 (six) hours as needed (vertigo). (Patient taking differently: Take 2 mg by mouth every 6 (six) hours as needed (vertigo).)   escitalopram (LEXAPRO) 10 MG tablet, Take 10 mg by mouth in the morning.   Menthol , Topical Analgesic,  (ABSORBINE PLUS JR) 6.5 % PTCH, Place  1 patch onto the skin daily as needed (pain.).   methocarbamol  (ROBAXIN ) 500 MG tablet, Take 500 mg by mouth 2 (two) times daily as needed for muscle spasms.   metoCLOPramide  (REGLAN ) 10 MG tablet, Take 1 tablet (10 mg total) by mouth 3 (three) times daily with meals.   ondansetron  (ZOFRAN ) 8 MG tablet, Take 8 mg by mouth 3 (three) times daily as needed for nausea or vomiting.   rOPINIRole  (REQUIP ) 1 MG tablet, Take 1 mg by mouth at bedtime.  Medical History:  Past Medical History:  Diagnosis Date   Anemia    Anxiety    Arthritis    multiple areas   Bone marrow disease    DX many years ago - pt unsure of name and unable to find info on type of disease   Complication of anesthesia 2011   slow to awaken after hysterectomy   Depression    Dyspnea    if does not take albuterol  she states she can not breath    Family history of adverse reaction to anesthesia    pts mother has nausea and vomiting; pts sons become aggressive and have hallicunations   Fibromyalgia    H/O iron deficiency anemia    Headache    MIGRAINES   History of blood transfusion    day pt was born   History of bronchitis    Hx of pneumothorax    X2 IN PAST   Kidney disease    only has one functioning kidney - no problems  x 23 yrs   Liver failure, acute 5 years ago   cause unknown, after hysterectomy   Neuropathy    Numbness    MULTIPLE AREAS - from all the surgeries I've had   PONV (postoperative nausea and vomiting)    Allergies:  Allergies  Allergen Reactions   Fish Allergy Anaphylaxis    To all fish, not just shellfish.  Cannot tolerate even the smell of seafood without her airway swelling/closing.   Other Shortness Of Breath    Whipped cream and pudding   Nortriptyline Other (See Comments)    hallucinations   Cyclobenzaprine    Tylenol  [Acetaminophen ] Other (See Comments)    Cannot take due to past liver failure   Bee Venom Hives   Flexeril [Cyclobenzaprine  Hcl] Other (See Comments)    Intense anger     Surgical History:  She  has a past surgical history that includes polyp removal (2010); Back surgery; Hernia repair (2011); Foot surgery (1989); Shoulder surgery; Abdominal hysterectomy; CERVICAL  SPINE SURG; Cervical discectomy; Knee arthroscopy with medial menisectomy (Right, 03/13/2014); Knee arthroscopy with drilling/microfracture (Right, 03/13/2014); Knee arthroscopy with medial patellar femoral ligament reconstruction (Right, 03/13/2014); Synovectomy (Right, 03/13/2014); Total knee arthroplasty (Right, 07/30/2014); Tonsillectomy; Dilation and curettage of uterus; left knee arthroscopy; right leg surgery ; and Total knee revision with scar debridement/patella revision with poly exchange (Right, 04/05/2016). Family History:  Her family history includes Colon cancer in her father; Heart disease in her father, maternal grandfather, maternal grandmother, mother, paternal grandfather, and paternal grandmother.  REVIEW OF SYSTEMS  : All other systems reviewed and negative except where noted in the History of Present Illness.  PHYSICAL EXAM: BP 122/68   Pulse 78   Ht 5' 4.5 (1.638 m)   Wt 209 lb (94.8 kg)   BMI 35.32 kg/m  Physical Exam   MEASUREMENTS: Weight- 205. GENERAL APPEARANCE: Well nourished, in no apparent distress HEENT: No cervical lymphadenopathy, unremarkable thyroid , sclerae anicteric, conjunctiva pink  RESPIRATORY: Respiratory effort normal, BS equal bilateral without rales, rhonchi, wheezing CARDIO: RRR with no MRGs, peripheral pulses intact ABDOMEN: Soft, non distended, active bowel sounds in all 4 quadrants, no tenderness to palpation, no rebound, no mass appreciated RECTAL: declines MUSCULOSKELETAL: Full ROM, normal gait, without edema SKIN: Dry, intact without rashes or lesions. No jaundice. NEURO: Alert, oriented, no focal deficits PSYCH: Cooperative, normal mood and affect.      Alan JONELLE Coombs, PA-C 10:30 AM

## 2024-02-14 ENCOUNTER — Other Ambulatory Visit (INDEPENDENT_AMBULATORY_CARE_PROVIDER_SITE_OTHER)

## 2024-02-14 ENCOUNTER — Encounter: Payer: Self-pay | Admitting: Physician Assistant

## 2024-02-14 ENCOUNTER — Ambulatory Visit (INDEPENDENT_AMBULATORY_CARE_PROVIDER_SITE_OTHER): Admitting: Physician Assistant

## 2024-02-14 ENCOUNTER — Ambulatory Visit (INDEPENDENT_AMBULATORY_CARE_PROVIDER_SITE_OTHER)
Admission: RE | Admit: 2024-02-14 | Discharge: 2024-02-14 | Disposition: A | Discharge status: Home / Self Care | Source: Ambulatory Visit | Attending: Physician Assistant | Admitting: Physician Assistant

## 2024-02-14 VITALS — BP 122/68 | HR 78 | Ht 64.5 in | Wt 209.0 lb

## 2024-02-14 DIAGNOSIS — R197 Diarrhea, unspecified: Secondary | ICD-10-CM

## 2024-02-14 DIAGNOSIS — R112 Nausea with vomiting, unspecified: Secondary | ICD-10-CM

## 2024-02-14 DIAGNOSIS — K9182 Postprocedural hepatic failure: Secondary | ICD-10-CM

## 2024-02-14 DIAGNOSIS — R948 Abnormal results of function studies of other organs and systems: Secondary | ICD-10-CM

## 2024-02-14 DIAGNOSIS — M898X9 Other specified disorders of bone, unspecified site: Secondary | ICD-10-CM

## 2024-02-14 DIAGNOSIS — Z862 Personal history of diseases of the blood and blood-forming organs and certain disorders involving the immune mechanism: Secondary | ICD-10-CM

## 2024-02-14 DIAGNOSIS — R634 Abnormal weight loss: Secondary | ICD-10-CM | POA: Diagnosis not present

## 2024-02-14 LAB — CBC WITH DIFFERENTIAL/PLATELET
Basophils Absolute: 0.1 K/uL (ref 0.0–0.1)
Basophils Relative: 0.8 % (ref 0.0–3.0)
Eosinophils Absolute: 0.2 K/uL (ref 0.0–0.7)
Eosinophils Relative: 3.2 % (ref 0.0–5.0)
HCT: 38.9 % (ref 36.0–46.0)
Hemoglobin: 12.6 g/dL (ref 12.0–15.0)
Lymphocytes Relative: 28 % (ref 12.0–46.0)
Lymphs Abs: 1.8 K/uL (ref 0.7–4.0)
MCHC: 32.5 g/dL (ref 30.0–36.0)
MCV: 84.9 fl (ref 78.0–100.0)
Monocytes Absolute: 0.9 K/uL (ref 0.1–1.0)
Monocytes Relative: 14.5 % — ABNORMAL HIGH (ref 3.0–12.0)
Neutro Abs: 3.5 K/uL (ref 1.4–7.7)
Neutrophils Relative %: 53.5 % (ref 43.0–77.0)
Platelets: 289 K/uL (ref 150.0–400.0)
RBC: 4.58 Mil/uL (ref 3.87–5.11)
RDW: 14.4 % (ref 11.5–15.5)
WBC: 6.5 K/uL (ref 4.0–10.5)

## 2024-02-14 LAB — COMPREHENSIVE METABOLIC PANEL WITH GFR
ALT: 8 U/L (ref 0–35)
AST: 13 U/L (ref 0–37)
Albumin: 4.2 g/dL (ref 3.5–5.2)
Alkaline Phosphatase: 107 U/L (ref 39–117)
BUN: 11 mg/dL (ref 6–23)
CO2: 29 meq/L (ref 19–32)
Calcium: 9.1 mg/dL (ref 8.4–10.5)
Chloride: 105 meq/L (ref 96–112)
Creatinine, Ser: 0.68 mg/dL (ref 0.40–1.20)
GFR: 93.58 mL/min (ref >60.00)
Glucose, Bld: 86 mg/dL (ref 70–99)
Potassium: 3.9 meq/L (ref 3.5–5.1)
Sodium: 141 meq/L (ref 135–145)
Total Bilirubin: 0.5 mg/dL (ref 0.2–1.2)
Total Protein: 7.1 g/dL (ref 6.0–8.3)

## 2024-02-14 LAB — SEDIMENTATION RATE: Sed Rate: 22 mm/h (ref 0–30)

## 2024-02-14 NOTE — Patient Instructions (Addendum)
 _______________________________________________________  If your blood pressure at your visit was 140/90 or greater, please contact your primary care physician to follow up on this.  _______________________________________________________  If you are age 62 or older, your body mass index should be between 23-30. Your Body mass index is 35.32 kg/m. If this is out of the aforementioned range listed, please consider follow up with your Primary Care Provider.  If you are age 85 or younger, your body mass index should be between 19-25. Your Body mass index is 35.32 kg/m. If this is out of the aformentioned range listed, please consider follow up with your Primary Care Provider.   ________________________________________________________  The [facility] providers would like to encourage you to use MYCHART to communicate with providers for non-urgent requests or questions.  Due to long hold times on the telephone, sending your provider a message by [facility] may be a faster and more efficient way to get a response.  Please allow 48 business hours for a response.  Please remember that this is for non-urgent requests.  _______________________________________________________  [facility] is using a team-based approach to care.  Your team is made up of your doctor and two to three APPS. Our APPS (Nurse Practitioners and Physician Assistants) work with your physician to ensure care continuity for you. They are fully qualified to address your health concerns and develop a treatment plan. They communicate directly with your gastroenterologist to care for you. Seeing the Advanced Practice Practitioners on your physician's team can help you by facilitating care more promptly, often allowing for earlier appointments, access to diagnostic testing, procedures, and other specialty referrals.   Your provider has requested that you go to the basement level for lab work before leaving today. Press B on the  elevator. The lab is located at the first door on the left as you exit the elevator.  Your provider has requested that you have an abdominal x ray before leaving today. Please go to the basement floor to our Radiology department for the test.  You have been scheduled for an endoscopy. Please follow written instructions given to you at your visit today.  If you use inhalers (even only as needed), please bring them with you on the day of your procedure.  If you take any of the following medications, they will need to be adjusted prior to your procedure:   DO NOT TAKE 7 DAYS PRIOR TO TEST- Trulicity (dulaglutide) Ozempic, Wegovy (semaglutide) Mounjaro (tirzepatide) Bydureon Bcise (exanatide extended release)  DO NOT TAKE 1 DAY PRIOR TO YOUR TEST Rybelsus (semaglutide) Adlyxin (lixisenatide) Victoza (liraglutide) Byetta (exanatide) ___________________________________________________________________________  It has been recommended to you by your physician that you have a colonoscopy completed. Per your request, we did not schedule the procedure(s) today. Please contact our office at [phone] should you decide to have the procedure completed. You will be scheduled for a pre-visit and procedure at that time.  Due to recent changes in healthcare laws, you may see the results of your imaging and laboratory studies on MyChart before your provider has had a chance to review them.  We understand that in some cases there may be results that are confusing or concerning to you. Not all laboratory results come back in the same time frame and the provider may be waiting for multiple results in order to interpret others.  Please give us  48 hours in order for your provider to thoroughly review all the results before contacting the office for clarification of your results.   Understanding  Your Weekly GLP-1 Injection  A helpful guide to managing common side effects  You are on a once-weekly injectable  medication in the GLP-1 receptor agonist class. These medications can be very effective for blood sugar control, weight loss, and heart protection, fatty liver or OSA, but they can also come with some side effects that are important to understand. The good news is: most side effects can be managed with a few adjustments.  1. Gastroparesis-Like Symptoms These medications slow down your stomach to help you feel full longer -- great for weight loss and blood sugar control, but they can sometimes cause symptoms that feel like gastroparesis (slow stomach emptying). Symptoms may include: -Feeling full quickly when eating -Nausea or vomiting -Bloating or abdominal discomfort -Worsening heartburn or reflux -Acid regurgitation -Stomach spasms or tightness What you can do: ??? Eat small, frequent meals (4-6 per day) ?? Drink fluids between meals, not during ?? Avoid high-fiber foods (like raw veggies or whole grains); cook your veggies well ?? Spread protein throughout the day (try [place] yogurt, eggs, soft meats, Glucerna, milk) ?? Choose soft foods you can mash with a fork ?? Switch to pured foods or liquids during flare-ups ?? Consider reading: Living Well with Gastroparesis by Camelia Bone ?? Downloadable Diet Guide: [facility] Gastroparesis Diet PDF  ?? Tip: Try following a gastroparesis-friendly diet on the day of your injection and the day after, when the medication's effect is strongest.  2. Constipation Since this medication slows down your digestive system, constipation is very common. Tips to help: ?? Drink plenty of water  ???? Stay active with regular exercise ?? Add fiber-rich but gentle foods like kiwi ?? Try a low-dose magnesium  supplement at night ?? Use MiraLAX  (half to one capful daily) if constipation becomes more frequent, especially if your dose increases  If these strategies don't help, talk to your provider -- they may recommend or prescribe other  treatments.  3. When to Call the Doctor or Go to the ER While rare, this medication can slightly increase your risk of serious conditions like: Pancreatitis (inflammation of the pancreas) Gallstones or gallbladder problems Watch for these signs and seek help if you experience: Severe abdominal pain (especially in the upper belly or that radiates to your back) Pain in the right upper side of your abdomen Nausea, vomiting, fever, or chills that don't go away ?? Call your provider or go to the ER if these occur.  4. Who Should NOT Take This Medication? This medication should be avoided if you have: A personal history of pancreatitis  A personal or family history of medullary thyroid  cancer A condition called Multiple Endocrine Neoplasia Syndrome Type 2 (MEN2)  Final Note If the side effects are too bothersome, remember: Most symptoms will go away if you stop the medication. But many people tolerate it well after the first few weeks, especially with the right strategies in place.  Reglan  or metoclopramide   Can be used for gastroparesis or slow stomach, nausea, vomiting, GERD.   Continue the medication as needed up to 3 times a day, on an empty stomach 30 minutes before eating. It may take a few weeks for your stomach condition to start to get better. However, do not take this medicine for longer than 12 weeks.  The longer you take this medicine, and the more you take it, the greater your chances are of developing serious side effects.  Some people may get a severe muscle problem called tardive dyskinesia. This problem may lessen or  go away after stopping this drug, but it may not go away. The risk is greater with diabetes and in older adults, especially older females. The risk is greater with longer use or higher doses, but it may also occur after short-term use with low doses. Call your doctor right away if you have trouble controlling body movements or problems with your tongue, face,  mouth, or jaw like tongue sticking out, puffing cheeks, mouth puckering, or chewing.   Please monitor for worsening depression or thoughts of suicide, any aggressiveness or hyperactivity.  If this happen please stop and call your physician right away.     B12 is mainly in meat, so increase meat can help this but often people have a deficiency that they are just not absorbing it well with meat or pills, so get the sublingual/melt in your mouth one.   If the sublingual one dose not increase your level, we will discuss shots.   Vitamin B12 Deficiency Vitamin B12 deficiency occurs when the body does not have enough vitamin B12, which is an important vitamin. The body needs this vitamin: To make red blood cells. To make DNA. This is the genetic material inside cells. To help the nerves work properly so they can carry messages from the brain to the body. Vitamin B12 deficiency can cause various health problems, such as a low red blood cell count (anemia) or nerve damage. What are the causes? This condition may be caused by: Not eating enough foods that contain vitamin B12. Not having enough stomach acid and digestive fluids to properly absorb vitamin B12 from the food that you eat. Certain digestive system diseases that make it hard to absorb vitamin B12. These diseases include Crohn's disease, chronic pancreatitis, and cystic fibrosis. A condition in which the body does not make enough of a protein (intrinsic factor), resulting in too few red blood cells (pernicious anemia). Having a surgery in which part of the stomach or small intestine is removed. Taking certain medicines that make it hard for the body to absorb vitamin B12. These medicines include: Heartburn medicines (antacids and proton pump inhibitors). Certain antibiotic medicines. Some medicines that are used to treat diabetes, tuberculosis, gout, or high cholesterol. What increases the risk? The following factors may make you more  likely to develop a B12 deficiency: Being older than age 8. Eating a vegetarian or vegan diet, especially while you are pregnant. Eating a poor diet while you are pregnant. Taking certain medicines. Having alcoholism. What are the signs or symptoms? In some cases, there are no symptoms of this condition. If the condition leads to anemia or nerve damage, various symptoms can occur, such as: Weakness. Fatigue. Loss of appetite. Weight loss. Numbness or tingling in your hands and feet. Redness and burning of the tongue. Confusion or memory problems. Depression. Sensory problems, such as color blindness, ringing in the ears, or loss of taste. Diarrhea or constipation. Trouble walking. If anemia is severe, symptoms can include: Shortness of breath. Dizziness. Rapid heart rate (tachycardia). How is this diagnosed? This condition may be diagnosed with a blood test to measure the level of vitamin B12 in your blood. You may also have other tests, including: A group of tests that measure certain characteristics of blood cells (complete blood count, CBC). A blood test to measure intrinsic factor. A procedure where a thin tube with a camera on the end is used to look into your stomach or intestines (endoscopy). Other tests may be needed to discover the cause of  B12 deficiency. How is this treated? Treatment for this condition depends on the cause. This condition may be treated by: Changing your eating and drinking habits, such as: Eating more foods that contain vitamin B12. Drinking less alcohol or no alcohol. Getting vitamin B12 injections. Taking vitamin B12 supplements. Your health care provider will tell you which dosage is best for you. Follow these instructions at home: Eating and drinking  Eat lots of healthy foods that contain vitamin B12, including: Meats and poultry. This includes beef, pork, chicken, [place], and organ meats, such as liver. Seafood. This includes clams,  rainbow trout, salmon, tuna, and haddock. Eggs. Cereal and dairy products that are fortified. This means that vitamin B12 has been added to the food. Check the label on the package to see if the food is fortified. The items listed above may not be a complete list of recommended foods and beverages. Contact a dietitian for more information. General instructions Get any injections that are prescribed by your health care provider. Take supplements only as told by your health care provider. Follow the directions carefully. Do not drink alcohol if your health care provider tells you not to. In some cases, you may only be asked to limit alcohol use. Keep all follow-up visits as told by your health care provider. This is important. Contact a health care provider if: Your symptoms come back. Get help right away if you: Develop shortness of breath. Have a rapid heart rate. Have chest pain. Become dizzy or lose consciousness. Summary Vitamin B12 deficiency occurs when the body does not have enough vitamin B12. The main causes of vitamin B12 deficiency include dietary deficiency, digestive diseases, pernicious anemia, and having a surgery in which part of the stomach or small intestine is removed. In some cases, there are no symptoms of this condition. If the condition leads to anemia or nerve damage, various symptoms can occur, such as weakness, shortness of breath, and numbness. Treatment may include getting vitamin B12 injections or taking vitamin B12 supplements. Eat lots of healthy foods that contain vitamin B12. This information is not intended to replace advice given to you by your health care provider. Make sure you discuss any questions you have with your health care provider. Document Revised: 10/20/2018 Document Reviewed: 01/10/2018 Elsevier Patient Education  2020 [employer].  Thank you for entrusting me with your care and choosing [facility].  Alan Coombs, PA-C

## 2024-02-15 ENCOUNTER — Ambulatory Visit (AMBULATORY_SURGERY_CENTER): Admitting: Gastroenterology

## 2024-02-15 ENCOUNTER — Encounter: Payer: Self-pay | Admitting: Gastroenterology

## 2024-02-15 VITALS — BP 125/77 | HR 67 | Temp 97.3°F | Resp 12 | Ht 64.5 in | Wt 209.0 lb

## 2024-02-15 DIAGNOSIS — K2970 Gastritis, unspecified, without bleeding: Secondary | ICD-10-CM

## 2024-02-15 DIAGNOSIS — B9681 Helicobacter pylori [H. pylori] as the cause of diseases classified elsewhere: Secondary | ICD-10-CM

## 2024-02-15 DIAGNOSIS — K2100 Gastro-esophageal reflux disease with esophagitis, without bleeding: Secondary | ICD-10-CM | POA: Diagnosis not present

## 2024-02-15 DIAGNOSIS — R948 Abnormal results of function studies of other organs and systems: Secondary | ICD-10-CM

## 2024-02-15 DIAGNOSIS — K3189 Other diseases of stomach and duodenum: Secondary | ICD-10-CM

## 2024-02-15 DIAGNOSIS — K449 Diaphragmatic hernia without obstruction or gangrene: Secondary | ICD-10-CM

## 2024-02-15 MED ORDER — SODIUM CHLORIDE 0.9 % IV SOLN: 500.0000 mL | INTRAVENOUS | Status: DC

## 2024-02-15 MED ORDER — PANTOPRAZOLE SODIUM 40 MG PO TBEC: 40.0000 mg | DELAYED_RELEASE_TABLET | Freq: Two times a day (BID) | ORAL | 3 refills | Status: AC

## 2024-02-15 NOTE — Patient Instructions (Addendum)
-  Handout on gastritis provided. -await pathology results. -Continue present medications. - No  ibuprofen , naproxen, or other non-steroidal anti-inflammatory drugs.   YOU HAD AN ENDOSCOPIC PROCEDURE TODAY AT THE [facility]:   Refer to the procedure report that was given to you for any specific questions about what was found during the examination.  If the procedure report does not answer your questions, please call your gastroenterologist to clarify.  If you requested that your care partner not be given the details of your procedure findings, then the procedure report has been included in a sealed envelope for you to review at your convenience later.  YOU SHOULD EXPECT: Some feelings of bloating in the abdomen. Passage of more gas than usual.  Walking can help get rid of the air that was put into your GI tract during the procedure and reduce the bloating. If you had a lower endoscopy (such as a colonoscopy or flexible sigmoidoscopy) you may notice spotting of blood in your stool or on the toilet paper. If you underwent a bowel prep for your procedure, you may not have a normal bowel movement for a few days.  Please Note:  You might notice some irritation and congestion in your nose or some drainage.  This is from the oxygen used during your procedure.  There is no need for concern and it should clear up in a day or so.  SYMPTOMS TO REPORT IMMEDIATELY:  Following upper endoscopy (EGD)  Vomiting of blood or coffee ground material  New chest pain or pain under the shoulder blades  Painful or persistently difficult swallowing  New shortness of breath  Fever of 100F or higher  Black, tarry-looking stools  For urgent or emergent issues, a gastroenterologist can be reached at any hour by calling [phone]. Do not use MyChart messaging for urgent concerns.    DIET:  We do recommend a small meal at first, but then you may proceed to your regular diet.  Drink plenty of fluids but  you should avoid alcoholic beverages for 24 hours.  ACTIVITY:  You should plan to take it easy for the rest of today and you should NOT DRIVE or use heavy machinery until tomorrow (because of the sedation medicines used during the test).    FOLLOW UP: Our staff will call the number listed on your records the next business day following your procedure.  We will call around 7:15- 8:00 am to check on you and address any questions or concerns that you may have regarding the information given to you following your procedure. If we do not reach you, we will leave a message.     If any biopsies were taken you will be contacted by phone or by letter within the next 1-3 weeks.  Please call us  at [phone] if you have not heard about the biopsies in 3 weeks.    SIGNATURES/CONFIDENTIALITY: You and/or your care partner have signed paperwork which will be entered into your electronic medical record.  These signatures attest to the fact that that the information above on your After Visit Summary has been reviewed and is understood.  Full responsibility of the confidentiality of this discharge information lies with you and/or your care-partner.

## 2024-02-15 NOTE — Progress Notes (Unsigned)
 [facility] History and Physical   Primary Care Physician:  Loring Tanda Mae, MD   Reason for Procedure:  Abnormal PET CT  Plan:    EGD  with possible interventions as needed     HPI: [name] is a very pleasant 63 y.o. female here for EGD for abnormal PET CT of stomach Please refer to office visit note by Alan 02/14/24 for additional details.   The risks and benefits as well as alternatives of endoscopic procedure(s) have been discussed and reviewed. All questions answered. The patient agrees to proceed.    Past Medical History:  Diagnosis Date   Anemia    Anxiety    Arthritis    multiple areas   Bone marrow disease    DX many years ago - pt unsure of name and unable to find info on type of disease   Complication of anesthesia 2011   slow to awaken after hysterectomy   Depression    Dyspnea    if does not take albuterol  she states she can not breath    Family history of adverse reaction to anesthesia    pts mother has nausea and vomiting; pts sons become aggressive and have hallicunations   Fibromyalgia    H/O iron deficiency anemia    Headache    MIGRAINES   History of blood transfusion    day pt was born   History of bronchitis    Hx of pneumothorax    X2 IN PAST   Kidney disease    only has one functioning kidney - no problems  x 23 yrs   Liver failure, acute 5 years ago   cause unknown, after hysterectomy   Neuropathy    Numbness    MULTIPLE AREAS - from all the surgeries I've had   PONV (postoperative nausea and vomiting)     Past Surgical History:  Procedure Laterality Date   ABDOMINAL HYSTERECTOMY     BACK SURGERY     multiple   CERVICAL  SPINE SURG     SEVERAL TIMES   CERVICAL DISCECTOMY     with fusion   DILATION AND CURETTAGE OF UTERUS     FOOT SURGERY  1989   HERNIA REPAIR  2011   KNEE ARTHROSCOPY WITH DRILLING/MICROFRACTURE Right 03/13/2014   Procedure: RIGHT MICROFRACTURE TECHNIQUE;  Surgeon: Tanda DELENA Heading, MD;  Location: [facility];  Service: Orthopedics;  Laterality: Right;   KNEE ARTHROSCOPY WITH MEDIAL MENISECTOMY Right 03/13/2014   Procedure: RIGHT KNEE ARTHROSCOPY WITH MEDIAL FEMORAL CONDYLE REPAIR;  Surgeon: Tanda DELENA Heading, MD;  Location: [facility];  Service: Orthopedics;  Laterality: Right;   KNEE ARTHROSCOPY WITH MEDIAL PATELLAR FEMORAL LIGAMENT RECONSTRUCTION Right 03/13/2014   Procedure: ABRASION CHONDROPLASTY OF MEDIAL FEMORAL;  Surgeon: Tanda DELENA Heading, MD;  Location: [facility];  Service: Orthopedics;  Laterality: Right;   left knee arthroscopy     polyp removal  2010   uterine   right leg surgery      secondary to trauma   SHOULDER SURGERY     Bilaterally X9 SURGERIES   SYNOVECTOMY Right 03/13/2014   Procedure: SYNOVECTOMY OF THE SUPRAPATELLA POUCH;  Surgeon: Tanda DELENA Heading, MD;  Location: [facility];  Service: Orthopedics;  Laterality: Right;   TONSILLECTOMY     TOTAL KNEE ARTHROPLASTY Right 07/30/2014   Procedure: RIGHT TOTAL KNEE ARTHROPLASTY;  Surgeon: Tanda Heading, MD;  Location: [facility];  Service: Orthopedics;  Laterality: Right;   TOTAL KNEE REVISION WITH SCAR DEBRIDEMENT/PATELLA REVISION  WITH POLY EXCHANGE Right 04/05/2016   Procedure: OPEN SCAR DEBRIDEMENT WITH POLY EXCHANGE reviosion of patella;  Surgeon: Donnice Car, MD;  Location: [facility];  Service: Orthopedics;  Laterality: Right;    Prior to Admission medications   Medication Sig Start Date End Date Taking? Authorizing Provider  albuterol  (PROVENTIL ) 2 MG tablet Take 4 mg by mouth every 8 (eight) hours.    [provider]  celecoxib  (CELEBREX ) 200 MG capsule Take 1 capsule (200 mg total) by mouth every 12 (twelve) hours. Take along with Pepcid  to help avoid GI upset. Patient taking differently: Take 200 mg by mouth in the morning. Take along with Pepcid  to help avoid GI upset. 04/06/16   Danella Donnice, PA-C  diazepam  (VALIUM ) 5 MG tablet Take 1 tablet (5 mg total) by mouth every 6 (six) hours as needed  (vertigo). Patient taking differently: Take 2 mg by mouth every 6 (six) hours as needed (vertigo). 02/10/23   Levander Slate, MD  EPIPEN  2-PAK 0.3 MG/0.3ML SOAJ injection Inject 0.3 mg as directed as needed (allergic reaction).  02/18/16   [provider]  escitalopram (LEXAPRO) 10 MG tablet Take 10 mg by mouth in the morning.    [provider]  ferrous sulfate  325 (65 FE) MG tablet Take 325 mg by mouth in the morning.    [provider]  ibuprofen  (ADVIL ) 200 MG tablet Take 400 mg by mouth every 8 (eight) hours as needed (pain.).    [provider]  Menthol , Topical Analgesic, (ABSORBINE PLUS JR) 6.5 % PTCH Place 1 patch onto the skin daily as needed (pain.).    [provider]  methocarbamol  (ROBAXIN ) 500 MG tablet Take 500 mg by mouth 2 (two) times daily as needed for muscle spasms.    [provider]  metoCLOPramide  (REGLAN ) 10 MG tablet Take 1 tablet (10 mg total) by mouth 3 (three) times daily with meals. 01/04/24 01/03/25  Triplett, Kirk B, FNP  morphine  (MSIR) 15 MG tablet Take 15 mg by mouth every 8 (eight) hours as needed for severe pain.    [provider]  ondansetron  (ZOFRAN ) 8 MG tablet Take 8 mg by mouth 3 (three) times daily as needed for nausea or vomiting. 02/18/16   [provider]  propranolol  ER (INDERAL  LA) 60 MG 24 hr capsule Take 1 capsule (60 mg total) by mouth daily. 12/08/23   Gregg Lek, MD  rOPINIRole  (REQUIP ) 1 MG tablet Take 1 mg by mouth at bedtime.    [provider]  rosuvastatin (CRESTOR) 5 MG tablet Take 5 mg by mouth in the morning.    [provider]  Semaglutide, 2 MG/DOSE, (OZEMPIC, 2 MG/DOSE,) 8 MG/3ML SOPN Inject 2 mg into the skin every Thursday.    [provider]  SUMAtriptan  (IMITREX ) 50 MG tablet TAKE 1 TABLET BY MOUTH AS NEEDED FOR MIGRAINE. MAY REPEAT IN 2 HOURS IF HEADACHE PERSISTS OR RECURS. 12/29/23   Gregg Lek, MD    Current Outpatient Medications   Medication Sig Dispense Refill   albuterol  (PROVENTIL ) 2 MG tablet Take 4 mg by mouth every 8 (eight) hours.     celecoxib  (CELEBREX ) 200 MG capsule Take 1 capsule (200 mg total) by mouth every 12 (twelve) hours. Take along with Pepcid  to help avoid GI upset. (Patient taking differently: Take 200 mg by mouth in the morning. Take along with Pepcid  to help avoid GI upset.) 60 capsule 0   diazepam  (VALIUM ) 5 MG tablet Take 1 tablet (5 mg  total) by mouth every 6 (six) hours as needed (vertigo). (Patient taking differently: Take 2 mg by mouth every 6 (six) hours as needed (vertigo).) 20 tablet 0   EPIPEN  2-PAK 0.3 MG/0.3ML SOAJ injection Inject 0.3 mg as directed as needed (allergic reaction).      escitalopram (LEXAPRO) 10 MG tablet Take 10 mg by mouth in the morning.     ferrous sulfate  325 (65 FE) MG tablet Take 325 mg by mouth in the morning.     ibuprofen  (ADVIL ) 200 MG tablet Take 400 mg by mouth every 8 (eight) hours as needed (pain.).     Menthol , Topical Analgesic, (ABSORBINE PLUS JR) 6.5 % PTCH Place 1 patch onto the skin daily as needed (pain.).     methocarbamol  (ROBAXIN ) 500 MG tablet Take 500 mg by mouth 2 (two) times daily as needed for muscle spasms.     metoCLOPramide  (REGLAN ) 10 MG tablet Take 1 tablet (10 mg total) by mouth 3 (three) times daily with meals. 90 tablet 1   morphine  (MSIR) 15 MG tablet Take 15 mg by mouth every 8 (eight) hours as needed for severe pain.     ondansetron  (ZOFRAN ) 8 MG tablet Take 8 mg by mouth 3 (three) times daily as needed for nausea or vomiting.     propranolol  ER (INDERAL  LA) 60 MG 24 hr capsule Take 1 capsule (60 mg total) by mouth daily. 90 capsule 3   rOPINIRole  (REQUIP ) 1 MG tablet Take 1 mg by mouth at bedtime.     rosuvastatin (CRESTOR) 5 MG tablet Take 5 mg by mouth in the morning.     Semaglutide, 2 MG/DOSE, (OZEMPIC, 2 MG/DOSE,) 8 MG/3ML SOPN Inject 2 mg into the skin every Thursday.     SUMAtriptan  (IMITREX ) 50 MG tablet TAKE 1 TABLET BY  MOUTH AS NEEDED FOR MIGRAINE. MAY REPEAT IN 2 HOURS IF HEADACHE PERSISTS OR RECURS. 10 tablet 1   No current facility-administered medications for this visit.    Allergies as of 02/15/2024 - Review Complete 02/14/2024  Allergen Reaction Noted   Fish allergy Anaphylaxis 02/14/2013   Other Shortness Of Breath 03/24/2016   Nortriptyline Other (See Comments) 08/13/2010   Cyclobenzaprine  04/27/2018   Tylenol  [acetaminophen ] Other (See Comments) 07/25/2014   Bee venom Hives 02/14/2013   Flexeril [cyclobenzaprine hcl] Other (See Comments) 08/13/2010    Family History  Problem Relation Age of Onset   Heart disease Mother    Heart disease Father    Colon cancer Father    Heart disease Maternal Grandfather    Heart disease Maternal Grandmother    Heart disease Paternal Grandfather    Heart disease Paternal Grandmother     Social History   Socioeconomic History   Marital status: Divorced    Spouse name: Not on file   Number of children: 2   Years of education: Not on file   Highest education level: Not on file  Occupational History   Occupation: disabled    Comment: back problems  Tobacco Use   Smoking status: Never   Smokeless tobacco: Never  Vaping Use   Vaping status: Never Used  Substance and Sexual Activity   Alcohol use: No   Drug use: No   Sexual activity: Not on file  Other Topics Concern   Not on file  Social History Narrative   Divorced and lives with her youngest son. Disabled with back problems.   Right handed   Caffeine-1 cup daily   Social Drivers of Health  Financial Resource Strain: High Risk (05/30/2023)   Received from [facility]   Overall Financial Resource Strain (CARDIA)    Difficulty of Paying Living Expenses: Very hard  Food Insecurity: Food Insecurity Present (05/30/2023)   Received from [facility]   Hunger Vital Sign    Within the past 12 months, you worried that your food would run out before you got  the money to buy more.: Often true    Within the past 12 months, the food you bought just didn't last and you didn't have money to get more.: Often true  Transportation Needs: No Transportation Needs (05/30/2023)   Received from [facility] - Transportation    In the past 12 months, has lack of transportation kept you from medical appointments or from getting medications?: No    Lack of Transportation (Non-Medical): No  Physical Activity: Not on file  Stress: Not on file  Social Connections: Not on file  Intimate Partner Violence: Not on file    Review of Systems:  All other review of systems negative except as mentioned in the HPI.  Physical Exam: Vital signs in last 24 hours: There were no vitals taken for this visit. General:   Alert, NAD Lungs:  Clear .   Heart:  Regular rate and rhythm Abdomen:  Soft, nontender and nondistended. Neuro/Psych:  Alert and cooperative. Normal mood and affect. A and O x 3  Reviewed labs, radiology imaging, old records and pertinent past GI work up  Patient is appropriate for planned procedure(s) and anesthesia in an ambulatory setting   K. Veena Jonny Dearden , MD [phone]

## 2024-02-15 NOTE — Op Note (Signed)
 [facility] Patient Name: [name] Procedure Date: 02/15/2024 1:23 PM MRN: [id number] Endoscopist: Gustav ALONSO Mcgee , MD, [id number] Age: 63 Referring MD:  Date of Birth: May 20, 1960 Gender: Female Account #: [id number] Procedure:                Upper GI endoscopy Indications:              Abnormal PET scan of the GI tract Medicines:                Monitored Anesthesia Care Procedure:                Pre-Anesthesia Assessment:                           - Prior to the procedure, a History and Physical                            was performed, and patient medications and                            allergies were reviewed. The patient's tolerance of                            previous anesthesia was also reviewed. The risks                            and benefits of the procedure and the sedation                            options and risks were discussed with the patient.                            All questions were answered, and informed consent                            was obtained. Prior Anticoagulants: The patient has                            taken no anticoagulant or antiplatelet agents. ASA                            Grade Assessment: II - A patient with mild systemic                            disease. After reviewing the risks and benefits,                            the patient was deemed in satisfactory condition to                            undergo the procedure.                           After obtaining informed consent, the endoscope was  passed under direct vision. Throughout the                            procedure, the patient's blood pressure, pulse, and                            oxygen saturations were monitored continuously. The                            GIF [id number] #[id number] was introduced through the                            mouth, and advanced to the second part of duodenum.                            The upper GI  endoscopy was accomplished without                            difficulty. The patient tolerated the procedure                            well. Scope In: Scope Out: Findings:                 The Z-line was regular and was found 38 cm from the                            incisors.                           LA Grade B (one or more mucosal breaks greater than                            5 mm, not extending between the tops of two mucosal                            folds) esophagitis with no bleeding was found 36 to                            38 cm from the incisors.                           A small hiatal hernia was present.                           A single 10 mm mucosal papule (nodule) with no                            bleeding and no stigmata of recent bleeding was                            found at the pylorus. The nodule was [place]  classification IIa (superficial, elevated).                            Biopsies were taken with a cold forceps for                            histology.                           Patchy mild inflammation characterized by                            congestion (edema), erythema and friability was                            found in the entire examined stomach. Biopsies were                            taken with a cold forceps for Helicobacter pylori                            testing.                           The cardia and gastric fundus were normal on                            retroflexion.                           The examined duodenum was normal. Complications:            No immediate complications. Estimated Blood Loss:     Estimated blood loss was minimal. Impression:               - Z-line regular, 38 cm from the incisors.                           - LA Grade B reflux esophagitis with no bleeding.                           - Small hiatal hernia.                           - A single mucosal papule (nodule) found in the                             stomach. Biopsied.                           - Gastritis. Biopsied.                           - Normal examined duodenum. Recommendation:           - Patient has a contact number available for  emergencies. The signs and symptoms of potential                            delayed complications were discussed with the                            patient. Return to normal activities tomorrow.                            Written discharge instructions were provided to the                            patient.                           - Resume previous diet.                           - Continue present medications.                           - Await pathology results.                           - Return to GI office at the next available                            appointment.                           - No ibuprofen , naproxen, or other non-steroidal                            anti-inflammatory drugs.                           - Use Protonix  (pantoprazole ) 40 mg PO BID. Rx for                            90 days with 3 refills Hymen Arnett V. Marsalis Beaulieu, MD 02/15/2024 1:49:13 PM This report has been signed electronically.

## 2024-02-15 NOTE — Progress Notes (Unsigned)
 Sedate, gd SR, tolerated procedure well, VSS, report to RN

## 2024-02-15 NOTE — Progress Notes (Signed)
 Called to room to assist during endoscopic procedure.  Patient ID and intended procedure confirmed with present staff. Received instructions for my participation in the procedure from the performing physician.

## 2024-02-15 NOTE — Progress Notes (Unsigned)
 Pt's states no medical or surgical changes since previsit or office visit.

## 2024-02-16 ENCOUNTER — Telehealth: Payer: Self-pay

## 2024-02-16 ENCOUNTER — Ambulatory Visit: Payer: Self-pay | Admitting: Physician Assistant

## 2024-02-16 NOTE — Telephone Encounter (Signed)
  Follow up Call-     02/15/2024   12:59 PM  Call back number  Post procedure Call Back phone  # [phone]  Permission to leave phone message Yes     Patient questions:  Do you have a fever, pain , or abdominal swelling? No. Pain Score  0 *  Have you tolerated food without any problems? Yes.    Have you been able to return to your normal activities? Yes.    Do you have any questions about your discharge instructions: Diet   No. Medications  No. Follow up visit  No.  Do you have questions or concerns about your Care? No.  Actions: * If pain score is 4 or above: No action needed, pain <4.

## 2024-02-17 LAB — TISSUE TRANSGLUTAMINASE, IGA: (tTG) Ab, IgA: <1 U/mL

## 2024-02-17 LAB — IGA: Immunoglobulin A: 246 mg/dL (ref 70–320)

## 2024-02-17 LAB — PANCREATIC ELASTASE, FECAL

## 2024-02-20 ENCOUNTER — Other Ambulatory Visit

## 2024-02-20 DIAGNOSIS — R197 Diarrhea, unspecified: Secondary | ICD-10-CM

## 2024-02-21 LAB — SURGICAL PATHOLOGY

## 2024-02-23 ENCOUNTER — Other Ambulatory Visit: Payer: Self-pay | Admitting: Neurology

## 2024-02-23 ENCOUNTER — Ambulatory Visit: Payer: Self-pay | Admitting: Physician Assistant

## 2024-02-23 LAB — FECAL FAT, QUALITATIVE
Fat Qual Neutral, Stl: NORMAL
Fat Qual Total, Stl: NORMAL

## 2024-02-23 NOTE — Progress Notes (Unsigned)
 02/23/2024 [name] [id number] 05-26-1960  Referring provider: Loring Tanda Mae, * Primary GI doctor: Dr. Shila  ASSESSMENT AND PLAN:  Abnormal PET scan 01/09/2024 single hypermetabolic periportal lymph node and mild metabolic activity through the gastric pyloric region 2011 EGD colonoscopy with Dr. Dianna at [facility] show extremely poor prep Attempted colonoscopy 1-2 years ago with [facility] but states she could not tolerate the prep 02/15/2024 EGD Dr. Shila LA grade B esophagitis, small HH, single mucosal papula found the stomach gastritis normal Pathology showed positive H. pylori gastritis negative for metaplasia dysplasia and malignancy - treat H pylori -Consider GES with symptoms  History of Anemia 2011 History of iron infusions with Dr. Timmy, last time 2020 01/05/2024  HGB 13.2 MCV 86.2 Platelets 269 01/05/2024 Iron 52 Ferritin 69 B12 278 Recent Labs    01/04/24 1608 01/05/24 1407 02/14/24 0942  HGB 13.9 13.2 12.6  No current IDA, suggest starting B12 sublingual  Diarrhea with weight loss, post prandial On ozempic 2024 CTAP W0 colonic diverticulosis, large amount of stool throughout the colon CTAP W8/20/2025 unremarkable 02/14/2024 KUB mildly prominent stool in the right side 02/14/2024 sed rate negative, celiac negative, pancreatic elastase and fecal fat negative -Patient also needs a colonoscopy for diarrhea/screening however will not delay EGD, suggest 2 day prep and try to get pills, patient could not handle liquid prep last visit  History of liver failure, unknown cause After hysterectomy in 2012    Latest Ref Rng & Units 02/14/2024    9:42 AM 01/05/2024    2:07 PM 01/04/2024    4:08 PM  Hepatic Function  Total Protein 6.0 - 8.3 g/dL 7.1  7.3  7.3   Albumin 3.5 - 5.2 g/dL 4.2  4.3  3.9   AST 0 - 37 U/L 13  24  29    ALT 0 - 35 U/L 8  27  30    Alk Phosphatase 39 - 117 U/L 107  123  103   Total Bilirubin 0.2 - 1.2 mg/dL 0.5   0.3  0.2    Platelets 269  Unremarkable LFTs, continue to monitor  Morbid obesity  There is no height or weight on file to calculate BMI.  -Patient has been advised to make an attempt to improve diet and exercise patterns to aid in weight loss. -Recommended diet heavy in fruits and veggies and low in animal meats, cheeses, and dairy products, appropriate calorie intake  Patient Care Team: Loring Tanda Mae, MD as PCP - General (Family Medicine) Leva Rush, MD as Referring Physician (Obstetrics and Gynecology)  HISTORY OF PRESENT ILLNESS: 63 y.o. female with a past medical history listed below presents for evaluation of abnormal PET scan.   Patient follows with Dr. 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  01/12/2024 MR cardiac ejection fraction 50%, no myocardial scar. I last saw the patient in the office 02/14/2024 for abnormal PET scan nausea and vomiting.  Had EGD Dr. Shila 02/15/2024  Discussed the use of AI scribe software for clinical note transcription with the patient, who gave verbal consent to proceed.  History of Present Illness            She  reports that she has never smoked. She has never used smokeless tobacco. She reports that she does not drink alcohol and does not use drugs.  RELEVANT GI HISTORY, IMAGING AND LABS: Results         01/09/2024 PET scan 1. No  evidence malignancy on whole-body FDG PET scan. 2. Single hypermetabolic periportal lymph node and mild metabolic activity through the gastric pyloric region. Favor inflammatory activity. If continued clinical concern, consider upper endoscopy. No abnormal findings on comparison diagnostic CT scan from 01/04/2019  CBC    Component Value Date/Time   WBC 6.5 02/14/2024 0942   RBC 4.58 02/14/2024 0942   HGB 12.6 02/14/2024 0942   HGB 13.2 01/05/2024 1407   HGB 11.6 09/21/2010 0836   HCT 38.9 02/14/2024 0942   HCT 36.3 09/21/2010 0836   PLT 289.0  02/14/2024 0942   PLT 269 01/05/2024 1407   PLT 281 09/21/2010 0836   MCV 84.9 02/14/2024 0942   MCV 78 (L) 09/21/2010 0836   MCH 27.6 01/05/2024 1407   MCHC 32.5 02/14/2024 0942   RDW 14.4 02/14/2024 0942   RDW 19.0 (H) 09/21/2010 0836   LYMPHSABS 1.8 02/14/2024 0942   LYMPHSABS 2.2 09/21/2010 0836   MONOABS 0.9 02/14/2024 0942   EOSABS 0.2 02/14/2024 0942   EOSABS 0.1 09/21/2010 0836   BASOSABS 0.1 02/14/2024 0942   BASOSABS 0.0 09/21/2010 0836   Recent Labs    01/04/24 1608 01/05/24 1407 02/14/24 0942  HGB 13.9 13.2 12.6    CMP     Component Value Date/Time   NA 141 02/14/2024 0942   K 3.9 02/14/2024 0942   CL 105 02/14/2024 0942   CO2 29 02/14/2024 0942   GLUCOSE 86 02/14/2024 0942   BUN 11 02/14/2024 0942   CREATININE 0.68 02/14/2024 0942   CREATININE 0.69 01/05/2024 1407   CALCIUM 9.1 02/14/2024 0942   PROT 7.1 02/14/2024 0942   ALBUMIN 4.2 02/14/2024 0942   AST 13 02/14/2024 0942   AST 24 01/05/2024 1407   ALT 8 02/14/2024 0942   ALT 27 01/05/2024 1407   ALKPHOS 107 02/14/2024 0942   BILITOT 0.5 02/14/2024 0942   BILITOT 0.3 01/05/2024 1407   GFRNONAA >60 01/05/2024 1407   GFRAA >60 01/12/2020 0337   GFRAA >60 06/07/2018 0958      Latest Ref Rng & Units 02/14/2024    9:42 AM 01/05/2024    2:07 PM 01/04/2024    4:08 PM  Hepatic Function  Total Protein 6.0 - 8.3 g/dL 7.1  7.3  7.3   Albumin 3.5 - 5.2 g/dL 4.2  4.3  3.9   AST 0 - 37 U/L 13  24  29    ALT 0 - 35 U/L 8  27  30    Alk Phosphatase 39 - 117 U/L 107  123  103   Total Bilirubin 0.2 - 1.2 mg/dL 0.5  0.3  0.2       Latest Ref Rng & Units 01/03/2009    4:24 PM  Hepatitis C  HCV Quanitative <43 IU/mL Reference range: <43 (NOTE) No detectable level of HCV RNA.    HCV Quanitative Log <1.63 log 10 NOT CALC (NOTE)  This test utilizes the [place]  FDA approved Roche HCV Test Kit by RT-PCR.     Current Medications:   Current Outpatient Medications (Endocrine & Metabolic):    alendronate (FOSAMAX) 70  MG tablet, Take 70 mg by mouth once a week.   OZEMPIC, 1 MG/DOSE, 4 MG/3ML SOPN, SMARTSIG:1 Milligram(s) Once a Week   Semaglutide, 2 MG/DOSE, (OZEMPIC, 2 MG/DOSE,) 8 MG/3ML SOPN, Inject 2 mg into the skin every Thursday.  Current Outpatient Medications (Cardiovascular):    EPIPEN  2-PAK 0.3 MG/0.3ML SOAJ injection, Inject 0.3 mg as directed as needed (allergic reaction).  (Patient  not taking: Reported on 02/15/2024)   propranolol  ER (INDERAL  LA) 60 MG 24 hr capsule, Take 1 capsule (60 mg total) by mouth daily.   rosuvastatin (CRESTOR) 5 MG tablet, Take 5 mg by mouth in the morning.  Current Outpatient Medications (Respiratory):    albuterol  (PROVENTIL ) 2 MG tablet, Take 4 mg by mouth every 8 (eight) hours.  Current Outpatient Medications (Analgesics):    celecoxib  (CELEBREX ) 200 MG capsule, Take 1 capsule (200 mg total) by mouth every 12 (twelve) hours. Take along with Pepcid  to help avoid GI upset. (Patient taking differently: Take 200 mg by mouth in the morning. Take along with Pepcid  to help avoid GI upset.)   HYDROmorphone  (DILAUDID ) 2 MG tablet, Take by mouth.   ibuprofen  (ADVIL ) 200 MG tablet, Take 400 mg by mouth every 8 (eight) hours as needed (pain.).   morphine  (MSIR) 15 MG tablet, Take 15 mg by mouth every 8 (eight) hours as needed for severe pain.   SUMAtriptan  (IMITREX ) 50 MG tablet, TAKE 1 TABLET BY MOUTH AS NEEDED FOR MIGRAINE. MAY REPEAT IN 2 HOURS IF HEADACHE PERSISTS OR RECURS.   Current Outpatient Medications (Other):    busPIRone (BUSPAR) 15 MG tablet,    diazepam  (VALIUM ) 5 MG tablet, Take 1 tablet (5 mg total) by mouth every 6 (six) hours as needed (vertigo). (Patient taking differently: Take 2 mg by mouth every 6 (six) hours as needed (vertigo).)   escitalopram (LEXAPRO) 10 MG tablet, Take 10 mg by mouth in the morning.   Menthol , Topical Analgesic, (ABSORBINE PLUS JR) 6.5 % PTCH, Place 1 patch onto the skin daily as needed (pain.).   methocarbamol  (ROBAXIN ) 500 MG  tablet, Take 500 mg by mouth 2 (two) times daily as needed for muscle spasms.   metoCLOPramide  (REGLAN ) 10 MG tablet, Take 1 tablet (10 mg total) by mouth 3 (three) times daily with meals.   naloxone (NARCAN) nasal spray 4 mg/0.1 mL, SQUIRT INTO ONE NOSTRIL IF NEEDED FOR OVER-SEDATION FROM NARCOTICS, MAY REPEAT DOSE 2 TO 3 MINUTES IN OTHER NOSTRIL IF NEEDED. (Patient not taking: Reported on 02/15/2024)   ondansetron  (ZOFRAN ) 8 MG tablet, Take 8 mg by mouth 3 (three) times daily as needed for nausea or vomiting.   pantoprazole  (PROTONIX ) 40 MG tablet, Take 1 tablet (40 mg total) by mouth 2 (two) times daily.   rOPINIRole  (REQUIP ) 1 MG tablet, Take 1 mg by mouth at bedtime.  Medical History:  Past Medical History:  Diagnosis Date   Anemia    Anxiety    Arthritis    multiple areas   Bone marrow disease    DX many years ago - pt unsure of name and unable to find info on type of disease   Complication of anesthesia 2011   slow to awaken after hysterectomy   Depression    Dyspnea    if does not take albuterol  she states she can not breath    Family history of adverse reaction to anesthesia    pts mother has nausea and vomiting; pts sons become aggressive and have hallicunations   Fibromyalgia    H/O iron deficiency anemia    Headache    MIGRAINES   History of blood transfusion    day pt was born   History of bronchitis    Hx of pneumothorax    X2 IN PAST   Kidney disease    only has one functioning kidney - no problems  x 23 yrs   Liver failure, acute 5  years ago   cause unknown, after hysterectomy   Neuropathy    Numbness    MULTIPLE AREAS - from all the surgeries I've had   PONV (postoperative nausea and vomiting)    Allergies:  Allergies  Allergen Reactions   Fish Allergy Anaphylaxis    To all fish, not just shellfish.  Cannot tolerate even the smell of seafood without her airway swelling/closing.  Fish (substance)   Other Shortness Of Breath    Whipped cream and  pudding   Nortriptyline Other (See Comments)    hallucinations   Bee Venom Hives   Flexeril [Cyclobenzaprine Hcl] Other (See Comments)    Intense anger   Tylenol  [Acetaminophen ] Other (See Comments)    Cannot take due to past liver failure     Surgical History:  She  has a past surgical history that includes polyp removal (2010); Back surgery; Hernia repair (2011); Foot surgery (1989); Shoulder surgery; Abdominal hysterectomy; CERVICAL  SPINE SURG; Cervical discectomy; Knee arthroscopy with medial menisectomy (Right, 03/13/2014); Knee arthroscopy with drilling/microfracture (Right, 03/13/2014); Knee arthroscopy with medial patellar femoral ligament reconstruction (Right, 03/13/2014); Synovectomy (Right, 03/13/2014); Total knee arthroplasty (Right, 07/30/2014); Tonsillectomy; Dilation and curettage of uterus; left knee arthroscopy; right leg surgery ; and Total knee revision with scar debridement/patella revision with poly exchange (Right, 04/05/2016). Family History:  Her family history includes Colon cancer in her father; Heart disease in her father, maternal grandfather, maternal grandmother, mother, paternal grandfather, and paternal grandmother.  REVIEW OF SYSTEMS  : All other systems reviewed and negative except where noted in the History of Present Illness.  PHYSICAL EXAM: There were no vitals taken for this visit. Physical Exam          Alan JONELLE Coombs, PA-C 8:16 AM

## 2024-02-24 ENCOUNTER — Encounter: Payer: Self-pay | Admitting: Physician Assistant

## 2024-02-24 ENCOUNTER — Ambulatory Visit (INDEPENDENT_AMBULATORY_CARE_PROVIDER_SITE_OTHER): Admitting: Physician Assistant

## 2024-02-24 ENCOUNTER — Ambulatory Visit: Payer: Self-pay | Admitting: Gastroenterology

## 2024-02-24 VITALS — BP 146/80 | HR 76 | Ht 64.0 in | Wt 209.0 lb

## 2024-02-24 DIAGNOSIS — Z6835 Body mass index (BMI) 35.0-35.9, adult: Secondary | ICD-10-CM

## 2024-02-24 DIAGNOSIS — M898X9 Other specified disorders of bone, unspecified site: Secondary | ICD-10-CM

## 2024-02-24 DIAGNOSIS — R634 Abnormal weight loss: Secondary | ICD-10-CM | POA: Diagnosis not present

## 2024-02-24 DIAGNOSIS — K2970 Gastritis, unspecified, without bleeding: Secondary | ICD-10-CM

## 2024-02-24 DIAGNOSIS — R197 Diarrhea, unspecified: Secondary | ICD-10-CM

## 2024-02-24 DIAGNOSIS — B9681 Helicobacter pylori [H. pylori] as the cause of diseases classified elsewhere: Secondary | ICD-10-CM | POA: Diagnosis not present

## 2024-02-24 DIAGNOSIS — Z860100 Personal history of colon polyps, unspecified: Secondary | ICD-10-CM

## 2024-02-24 DIAGNOSIS — R112 Nausea with vomiting, unspecified: Secondary | ICD-10-CM

## 2024-02-24 MED ORDER — METRONIDAZOLE 250 MG PO TABS: 250.0000 mg | ORAL_TABLET | Freq: Four times a day (QID) | ORAL | 0 refills | Status: AC

## 2024-02-24 MED ORDER — SUTAB 1479-225-188 MG PO TABS: ORAL_TABLET | ORAL | 0 refills | Status: DC

## 2024-02-24 MED ORDER — DOXYCYCLINE HYCLATE 100 MG PO TABS: 100.0000 mg | ORAL_TABLET | Freq: Two times a day (BID) | ORAL | 0 refills | Status: AC

## 2024-02-24 MED ORDER — BISMUTH SUBSALICYLATE 262 MG PO CHEW: 524.0000 mg | CHEWABLE_TABLET | Freq: Four times a day (QID) | ORAL | 0 refills | Status: AC

## 2024-02-24 NOTE — Patient Instructions (Addendum)
 Helicobacter pylori infection occurs when a type of bacteria called H. pylori infects a person's stomach and causes inflammation.  This bacteria if left untreated can cause ulcers and can increase the chances of stomach cancer.  What are the symptoms? ?Pain or discomfort in the upper belly ?Feeling full after eating a small amount of food ?Not feeling hungry ?Nausea or vomiting ?Dark or black-colored bowel movements ?Feeling more tired than usual  Since this is a bacteria that thrives in a harsh, acid environment, the treatment consist of a medication to decrease the acid in your stomach and different types of antibiotics.  It is VERY important to take the medication as prescribed and finish the course to assure the bacteria is treated.  If you have an issue with the medication prescribed please call our office so we can help.  We will do a eradication test or test to assure that you test negative for the bacteria 6-8 weeks after you complete treatment with a stools sample, breath test or upper endoscopy with biopsy.   Can H. pylori be prevented? H. pylori infection can be passed from one person to another. You may want to get other members of your household checked. Some ways to help keep others safe: ?Avoid touching the saliva, vomit, or bowel movements of anyone who has H. pylori infection. ?Wash your hands with soap and water . This is especially important after going to the bathroom or changing diapers and before cooking or preparing food. ?Only drink water  from safe sources. Avoid bathing or swimming in contaminated water .  Call for advice if: ?You vomit and notice blood or something that looks like coffee grounds. ?Your stools have blood in them or are black or tar colored. ?You start having severe pain in your belly. ?Your belly becomes hard or swollen or hurts if you press on it. ?You start to feel very weak, lightheaded, or like you might pass out.  Understanding Your Weekly  GLP-1 Injection  A helpful guide to managing common side effects  You are on a once-weekly injectable medication in the GLP-1 receptor agonist class. These medications can be very effective for blood sugar control, weight loss, and heart protection, fatty liver or OSA, but they can also come with some side effects that are important to understand. The good news is: most side effects can be managed with a few adjustments.  1. Gastroparesis-Like Symptoms These medications slow down your stomach to help you feel full longer -- great for weight loss and blood sugar control, but they can sometimes cause symptoms that feel like gastroparesis (slow stomach emptying). Symptoms may include: -Feeling full quickly when eating -Nausea or vomiting -Bloating or abdominal discomfort -Worsening heartburn or reflux -Acid regurgitation -Stomach spasms or tightness What you can do: ??? Eat small, frequent meals (4-6 per day) ?? Drink fluids between meals, not during ?? Avoid high-fiber foods (like raw veggies or whole grains); cook your veggies well ?? Spread protein throughout the day (try [place] yogurt, eggs, soft meats, Glucerna, milk) ?? Choose soft foods you can mash with a fork ?? Switch to pured foods or liquids during flare-ups ?? Consider reading: Living Well with Gastroparesis by Camelia Bone ?? Downloadable Diet Guide: [facility] Gastroparesis Diet PDF  ?? Tip: Try following a gastroparesis-friendly diet on the day of your injection and the day after, when the medication's effect is strongest.  2. Constipation Since this medication slows down your digestive system, constipation is very common. Tips to help: ?? Drink plenty  of water  ???? Stay active with regular exercise ?? Add fiber-rich but gentle foods like kiwi ?? Try a low-dose magnesium  supplement at night ?? Use MiraLAX  (half to one capful daily) if constipation becomes more frequent, especially if your dose increases  If  these strategies don't help, talk to your provider -- they may recommend or prescribe other treatments.  3. When to Call the Doctor or Go to the ER While rare, this medication can slightly increase your risk of serious conditions like: Pancreatitis (inflammation of the pancreas) Gallstones or gallbladder problems Watch for these signs and seek help if you experience: Severe abdominal pain (especially in the upper belly or that radiates to your back) Pain in the right upper side of your abdomen Nausea, vomiting, fever, or chills that don't go away ?? Call your provider or go to the ER if these occur.  4. Who Should NOT Take This Medication? This medication should be avoided if you have: A personal history of pancreatitis  A personal or family history of medullary thyroid  cancer A condition called Multiple Endocrine Neoplasia Syndrome Type 2 (MEN2)  Final Note If the side effects are too bothersome, remember: Most symptoms will go away if you stop the medication. But many people tolerate it well after the first few weeks, especially with the right strategies in place.  I believe your loose stools may be due to something called overflow diarrhea. If you predominantly have constipation, straining or incomplete bowel movements at times the only thing to get through the large amounts of stool is liquid. I describe it like rocks in a tube, if you pour water  into the tube, only water  will come out with occ rocks(stools).  This can cause someone to feel like they are having diarrhea yet actually they have too much backup of stool. We can evaluate this with a rectal exam, and x-ray of your abdomen, or CT scan. If this is the case usually doing a combination of Benefiber 1-2 times daily with MiraLAX  17 g daily can be helpful. At times there are medications that can also be helpful for this but generally I start with over-the-counter I also like somebody to get a squatty potty to help straighten out  the colon whether having a bowel movement. Sometimes we will schedule for pelvic floor physical therapy if you are having incomplete bowel movements and failure pelvic floor is weak and contributing to the constipation.  Benefiber or Citracel is good for constipation/diarrhea/irritable bowel syndrome, it helps with weight loss and can help lower your bad cholesterol. Please do 1 TBSP in the morning in water , coffee, or tea up to twice a day. It can take up to a month before you can see a difference with your bowel movements. It is cheapest from [employer], [employer], [employer].   Miralax  is an osmotic laxative.  It only brings more water  into the stool.  This is safe to take daily.  Can take up to 17 gram of miralax  twice a day.  Mix with juice or coffee.  Start 1 capful at night for 3-4 days and reassess your response in 3-4 days.  You can increase and decrease the dose based on your response.  Remember, it can take up to 3-4 days to take effect OR for the effects to wear off.   I often pair this with benefiber in the morning to help assure the stool is not too loose.   Consider linzess  You have been scheduled for a colonoscopy. Please  follow written instructions given to you at your visit today.   If you use inhalers (even only as needed), please bring them with you on the day of your procedure.  DO NOT TAKE 7 DAYS PRIOR TO TEST- Trulicity (dulaglutide) Ozempic, Wegovy (semaglutide) Mounjaro (tirzepatide) Bydureon Bcise (exanatide extended release)  DO NOT TAKE 1 DAY PRIOR TO YOUR TEST Rybelsus (semaglutide) Adlyxin (lixisenatide) Victoza (liraglutide) Byetta (exanatide) ___________________________________________________________________________    Due to recent changes in healthcare laws, you may see the results of your imaging and laboratory studies on MyChart before your provider has had a chance to review them.  We understand that in some cases there may be results that are  confusing or concerning to you. Not all laboratory results come back in the same time frame and the provider may be waiting for multiple results in order to interpret others.  Please give us  48 hours in order for your provider to thoroughly review all the results before contacting the office for clarification of your results.    I appreciate the  opportunity to care for you  Thank You   [facility] [address]

## 2024-02-25 LAB — PANCREATIC ELASTASE, FECAL: Pancreatic Elastase-1, Stool: >800 ug/g (ref >200)

## 2024-03-01 ENCOUNTER — Other Ambulatory Visit: Payer: Self-pay

## 2024-03-01 DIAGNOSIS — D649 Anemia, unspecified: Secondary | ICD-10-CM

## 2024-03-01 DIAGNOSIS — N289 Disorder of kidney and ureter, unspecified: Secondary | ICD-10-CM

## 2024-03-01 DIAGNOSIS — D72829 Elevated white blood cell count, unspecified: Secondary | ICD-10-CM

## 2024-03-01 DIAGNOSIS — N939 Abnormal uterine and vaginal bleeding, unspecified: Secondary | ICD-10-CM

## 2024-03-01 DIAGNOSIS — Z862 Personal history of diseases of the blood and blood-forming organs and certain disorders involving the immune mechanism: Secondary | ICD-10-CM

## 2024-03-02 ENCOUNTER — Inpatient Hospital Stay: Attending: Hematology & Oncology

## 2024-03-02 ENCOUNTER — Inpatient Hospital Stay: Admitting: Hematology & Oncology

## 2024-03-02 ENCOUNTER — Encounter: Payer: Self-pay | Admitting: Hematology & Oncology

## 2024-03-02 VITALS — BP 139/73 | HR 72 | Temp 98.8°F | Resp 20 | Ht 64.0 in | Wt 205.0 lb

## 2024-03-02 DIAGNOSIS — M898X9 Other specified disorders of bone, unspecified site: Secondary | ICD-10-CM | POA: Diagnosis present

## 2024-03-02 DIAGNOSIS — Z862 Personal history of diseases of the blood and blood-forming organs and certain disorders involving the immune mechanism: Secondary | ICD-10-CM | POA: Insufficient documentation

## 2024-03-02 DIAGNOSIS — N289 Disorder of kidney and ureter, unspecified: Secondary | ICD-10-CM

## 2024-03-02 DIAGNOSIS — D72829 Elevated white blood cell count, unspecified: Secondary | ICD-10-CM

## 2024-03-02 DIAGNOSIS — M545 Low back pain, unspecified: Secondary | ICD-10-CM | POA: Diagnosis not present

## 2024-03-02 DIAGNOSIS — N939 Abnormal uterine and vaginal bleeding, unspecified: Secondary | ICD-10-CM

## 2024-03-02 DIAGNOSIS — E8889 Other specified metabolic disorders: Secondary | ICD-10-CM | POA: Diagnosis not present

## 2024-03-02 DIAGNOSIS — D649 Anemia, unspecified: Secondary | ICD-10-CM

## 2024-03-02 DIAGNOSIS — G8929 Other chronic pain: Secondary | ICD-10-CM | POA: Diagnosis not present

## 2024-03-02 LAB — CMP (CANCER CENTER ONLY)
ALT: 13 U/L (ref 0–44)
AST: 28 U/L (ref 15–41)
Albumin: 4.4 g/dL (ref 3.5–5.0)
Alkaline Phosphatase: 125 U/L (ref 38–126)
Anion gap: 10 (ref 5–15)
BUN: 7 mg/dL — ABNORMAL LOW (ref 8–23)
CO2: 25 mmol/L (ref 22–32)
Calcium: 9.3 mg/dL (ref 8.9–10.3)
Chloride: 108 mmol/L (ref 98–111)
Creatinine: 0.68 mg/dL (ref 0.44–1.00)
GFR, Estimated: >60 mL/min (ref >60)
Glucose, Bld: 107 mg/dL — ABNORMAL HIGH (ref 70–99)
Potassium: 3.8 mmol/L (ref 3.5–5.1)
Sodium: 144 mmol/L (ref 135–145)
Total Bilirubin: 0.4 mg/dL (ref 0.0–1.2)
Total Protein: 7.1 g/dL (ref 6.5–8.1)

## 2024-03-02 LAB — IRON AND IRON BINDING CAPACITY (CC-WL,HP ONLY)
Iron: 74 ug/dL (ref 28–170)
Saturation Ratios: 21 % (ref 10.4–31.8)
TIBC: 349 ug/dL (ref 250–450)
UIBC: 275 ug/dL

## 2024-03-02 LAB — LACTATE DEHYDROGENASE: LDH: 224 U/L — ABNORMAL HIGH (ref 98–192)

## 2024-03-02 LAB — FERRITIN: Ferritin: 42 ng/mL (ref 11–307)

## 2024-03-02 LAB — SAVE SMEAR(SSMR), FOR PROVIDER SLIDE REVIEW

## 2024-03-02 LAB — CBC WITH DIFFERENTIAL (CANCER CENTER ONLY)
Abs Immature Granulocytes: 0.03 K/uL (ref 0.00–0.07)
Basophils Absolute: 0.1 K/uL (ref 0.0–0.1)
Basophils Relative: 1 %
Eosinophils Absolute: 0.1 K/uL (ref 0.0–0.5)
Eosinophils Relative: 2 %
HCT: 40.4 % (ref 36.0–46.0)
Hemoglobin: 12.9 g/dL (ref 12.0–15.0)
Immature Granulocytes: 0 %
Lymphocytes Relative: 22 %
Lymphs Abs: 1.6 K/uL (ref 0.7–4.0)
MCH: 27.7 pg (ref 26.0–34.0)
MCHC: 31.9 g/dL (ref 30.0–36.0)
MCV: 86.9 fL (ref 80.0–100.0)
Monocytes Absolute: 0.9 K/uL (ref 0.1–1.0)
Monocytes Relative: 12 %
Neutro Abs: 4.8 K/uL (ref 1.7–7.7)
Neutrophils Relative %: 63 %
Platelet Count: 330 K/uL (ref 150–400)
RBC: 4.65 MIL/uL (ref 3.87–5.11)
RDW: 14.1 % (ref 11.5–15.5)
WBC Count: 7.4 K/uL (ref 4.0–10.5)
nRBC: 0 % (ref 0.0–0.2)

## 2024-03-02 LAB — VITAMIN B12: Vitamin B-12: 1370 pg/mL — ABNORMAL HIGH (ref 180–914)

## 2024-03-02 LAB — URIC ACID: Uric Acid, Serum: 2.9 mg/dL (ref 2.5–7.1)

## 2024-03-02 LAB — TSH: TSH: 0.879 u[IU]/mL (ref 0.350–4.500)

## 2024-03-02 LAB — C-REACTIVE PROTEIN: CRP: 0.7 mg/dL (ref <1.0)

## 2024-03-02 NOTE — Progress Notes (Signed)
 Hematology and Oncology Follow Up Visit  [name] [id number] June 26, 1960 63 y.o. 03/02/2024   Principle Diagnosis:  Severe bony pain  Current Therapy:   Observation     Interim History:  [name] is back for [name] follow-up.  This is [name] second office visit.  We first saw [name] back in August.  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  I am not sure what the rationale was for thinking that.  Regardless, we did a evaluation on [name].  We did do a PET scan on [name].  The PET scan was negative for anything in [name] bones.  However, there was some adenopathy that was noted in the periportal area.  She also had some activity noted in [name] stomach.  She did undergo a upper endoscopy.  She was found to have Helicobacter infection.  She now is on treatment for this..  We did do a cardiac MRI.  The cardiac MRI also was relatively unrevealing.  She is even more miserable now.  I just feel bad for [name].  I am not sure exactly how we can try to help this.  Again, I just do not think that this is Erdheim-Chester disease.  However, we will try to sort out what else this might be.  Again she just is having a very very hard time.  Today she says that [name] arms are hurting [name] now.  She has had no fever.  She has had no bleeding.  There is been no change in bowel or bladder habits.  There has been no rashes.  She has had no leg swelling.  She has had no headache.  There is been no mouth sores.  Currently, I would say that [name] performance status is probably ECOG 1.  Medications:  Current Outpatient Medications:    albuterol  (PROVENTIL ) 2 MG tablet, Take 4 mg by mouth every 8 (eight) hours., Disp: , Rfl:    alendronate (FOSAMAX) 70 MG tablet, Take 70 mg by mouth once a week., Disp: , Rfl:    bismuth subsalicylate (PEPTO-BISMOL) 262 MG chewable tablet, Chew 2 tablets (524 mg total) by mouth 4 (four) times daily for 14 days., Disp: 112  tablet, Rfl: 0   busPIRone (BUSPAR) 15 MG tablet, , Disp: , Rfl:    celecoxib  (CELEBREX ) 200 MG capsule, Take 1 capsule (200 mg total) by mouth every 12 (twelve) hours. Take along with Pepcid  to help avoid GI upset., Disp: 60 capsule, Rfl: 0   diazepam  (VALIUM ) 5 MG tablet, Take 1 tablet (5 mg total) by mouth every 6 (six) hours as needed (vertigo)., Disp: 20 tablet, Rfl: 0   doxycycline (VIBRA-TABS) 100 MG tablet, Take 1 tablet (100 mg total) by mouth 2 (two) times daily for 14 days., Disp: 28 tablet, Rfl: 0   EPIPEN  2-PAK 0.3 MG/0.3ML SOAJ injection, Inject 0.3 mg as directed as needed (allergic reaction). , Disp: , Rfl:    escitalopram (LEXAPRO) 10 MG tablet, Take 10 mg by mouth in the morning., Disp: , Rfl:    HYDROmorphone  (DILAUDID ) 2 MG tablet, Take by mouth., Disp: , Rfl:    metoCLOPramide  (REGLAN ) 10 MG tablet, Take 1 tablet (10 mg total) by mouth 3 (three) times daily with meals., Disp: 90 tablet, Rfl: 1   metroNIDAZOLE  (FLAGYL ) 250 MG tablet, Take 1 tablet (250 mg total) by mouth 4 (four) times daily for 14 days., Disp: 56 tablet, Rfl: 0  ondansetron  (ZOFRAN ) 8 MG tablet, Take 8 mg by mouth 3 (three) times daily as needed for nausea or vomiting., Disp: , Rfl:    propranolol  ER (INDERAL  LA) 60 MG 24 hr capsule, Take 1 capsule (60 mg total) by mouth daily., Disp: 90 capsule, Rfl: 3   rOPINIRole  (REQUIP ) 1 MG tablet, Take 1 mg by mouth at bedtime., Disp: , Rfl:    rosuvastatin (CRESTOR) 5 MG tablet, Take 5 mg by mouth in the morning., Disp: , Rfl:    SUMAtriptan  (IMITREX ) 50 MG tablet, TAKE 1 TABLET BY MOUTH AS NEEDED FOR MIGRAINE. MAY REPEAT IN 2 HOURS IF HEADACHE PERSISTS OR RECURS., Disp: 10 tablet, Rfl: 1   ibuprofen  (ADVIL ) 200 MG tablet, Take 400 mg by mouth every 8 (eight) hours as needed (pain.). (Patient not taking: Reported on 03/02/2024), Disp: , Rfl:    methocarbamol  (ROBAXIN ) 500 MG tablet, Take 500 mg by mouth 2 (two) times daily as needed for muscle spasms. (Patient not  taking: Reported on 03/02/2024), Disp: , Rfl:    naloxone (NARCAN) nasal spray 4 mg/0.1 mL, SQUIRT INTO ONE NOSTRIL IF NEEDED FOR OVER-SEDATION FROM NARCOTICS, MAY REPEAT DOSE 2 TO 3 MINUTES IN OTHER NOSTRIL IF NEEDED. (Patient not taking: Reported on 03/02/2024), Disp: , Rfl:    OZEMPIC, 1 MG/DOSE, 4 MG/3ML SOPN, SMARTSIG:1 Milligram(s) Once a Week, Disp: , Rfl:    pantoprazole  (PROTONIX ) 40 MG tablet, Take 1 tablet (40 mg total) by mouth 2 (two) times daily. (Patient not taking: Reported on 03/02/2024), Disp: 180 tablet, Rfl: 3   Semaglutide, 2 MG/DOSE, (OZEMPIC, 2 MG/DOSE,) 8 MG/3ML SOPN, Inject 2 mg into the skin every Thursday., Disp: , Rfl:   Allergies:  Allergies  Allergen Reactions   Fish Allergy Anaphylaxis    To all fish, not just shellfish.  Cannot tolerate even the smell of seafood without [name] airway swelling/closing.  Fish (substance)   Other Shortness Of Breath    Whipped cream and pudding   Nortriptyline Other (See Comments)    hallucinations   Bee Venom Hives   Flexeril [Cyclobenzaprine Hcl] Other (See Comments)    Intense anger   Tylenol  [Acetaminophen ] Other (See Comments)    Cannot take due to past liver failure    Past Medical History, Surgical history, Social history, and Family History were reviewed and updated.  Review of Systems: Review of Systems  Constitutional:  Positive for unexpected weight change.  HENT:  Negative.    Eyes: Negative.   Respiratory: Negative.    Cardiovascular: Negative.   Gastrointestinal: Negative.   Endocrine: Negative.   Genitourinary: Negative.    Musculoskeletal:  Positive for arthralgias and myalgias.  Neurological: Negative.   Hematological: Negative.   Psychiatric/Behavioral: Negative.      Physical Exam:  height is 5' 4 (1.626 m) and weight is 205 lb (93 kg). [name] oral temperature is 98.8 F (37.1 C). [name] blood pressure is 139/73 and [name] pulse is 72. [name] respiration is 20 and oxygen saturation is 99%.   Wt Readings  from Last 3 Encounters:  03/02/24 205 lb (93 kg)  02/24/24 209 lb (94.8 kg)  02/15/24 209 lb (94.8 kg)    Physical Exam Vitals reviewed.  HENT:     Head: Normocephalic and atraumatic.  Eyes:     Pupils: Pupils are equal, round, and reactive to light.  Cardiovascular:     Rate and Rhythm: Normal rate and regular rhythm.     Heart sounds: Normal heart sounds.  Pulmonary:  Effort: Pulmonary effort is normal.     Breath sounds: Normal breath sounds.  Abdominal:     General: Bowel sounds are normal.     Palpations: Abdomen is soft.  Musculoskeletal:        General: No tenderness or deformity. Normal range of motion.     Cervical back: Normal range of motion.  Lymphadenopathy:     Cervical: No cervical adenopathy.  Skin:    General: Skin is warm and dry.     Findings: No erythema or rash.  Neurological:     Mental Status: She is alert and oriented to person, place, and time.  Psychiatric:        Behavior: Behavior normal.        Thought Content: Thought content normal.        Judgment: Judgment normal.      Lab Results  Component Value Date   WBC 7.4 03/02/2024   HGB 12.9 03/02/2024   HCT 40.4 03/02/2024   MCV 86.9 03/02/2024   PLT 330 03/02/2024     Chemistry      Component Value Date/Time   NA 144 03/02/2024 1440   K 3.8 03/02/2024 1440   CL 108 03/02/2024 1440   CO2 25 03/02/2024 1440   BUN 7 (L) 03/02/2024 1440   CREATININE 0.68 03/02/2024 1440      Component Value Date/Time   CALCIUM 9.3 03/02/2024 1440   ALKPHOS 125 03/02/2024 1440   AST 28 03/02/2024 1440   ALT 13 03/02/2024 1440   BILITOT 0.4 03/02/2024 1440       Impression and Plan: [name] is a very nice 63 year old white female.  She had been seen by us  in the past because of iron deficiency.  Now, she certainly has more issues.  She has his bony pain which is severe.  It mostly bothers [name] in [name] right lower leg.  It is truly difficult to know exactly what is going on.  It is very  difficult to get a bone biopsy.  She may need to be referred to an orthopedic oncologist.  I will do a bone marrow biopsy on [name].  This may help a little bit.  Again, I just feel bad that I cannot do more for [name] right now.  I really hate to see [name] be miserable.  I am sure that there is something going on with [name].  I just am not sure what this is right now.  We will go ahead and try to get the bone marrow biopsy set up in a week or so.  I will then plan to get [name] back to see [name] in another 3 or 4 weeks.  Again, we may have to think about doing a referral for Orthopedic Oncology.   Maude JONELLE Crease, MD 10/17/20254:59 PM

## 2024-03-12 ENCOUNTER — Other Ambulatory Visit: Payer: Self-pay | Admitting: Medical Genetics

## 2024-03-12 DIAGNOSIS — Z006 Encounter for examination for normal comparison and control in clinical research program: Secondary | ICD-10-CM

## 2024-03-27 ENCOUNTER — Other Ambulatory Visit: Payer: Self-pay | Admitting: Neurosurgery

## 2024-03-27 ENCOUNTER — Encounter: Payer: Self-pay | Admitting: Gastroenterology

## 2024-03-27 DIAGNOSIS — M47816 Spondylosis without myelopathy or radiculopathy, lumbar region: Secondary | ICD-10-CM

## 2024-03-29 ENCOUNTER — Other Ambulatory Visit: Payer: Self-pay | Admitting: Physician Assistant

## 2024-03-29 ENCOUNTER — Other Ambulatory Visit: Payer: Self-pay

## 2024-03-29 DIAGNOSIS — Z01818 Encounter for other preprocedural examination: Secondary | ICD-10-CM

## 2024-03-30 ENCOUNTER — Ambulatory Visit (HOSPITAL_COMMUNITY)
Admission: RE | Admit: 2024-03-30 | Discharge: 2024-03-30 | Disposition: A | Discharge status: Home / Self Care | Source: Ambulatory Visit | Attending: Hematology & Oncology | Admitting: Hematology & Oncology

## 2024-03-30 ENCOUNTER — Other Ambulatory Visit: Payer: Self-pay

## 2024-03-30 ENCOUNTER — Encounter (HOSPITAL_COMMUNITY): Payer: Self-pay

## 2024-03-30 DIAGNOSIS — M2550 Pain in unspecified joint: Secondary | ICD-10-CM | POA: Diagnosis not present

## 2024-03-30 DIAGNOSIS — M549 Dorsalgia, unspecified: Secondary | ICD-10-CM | POA: Diagnosis not present

## 2024-03-30 DIAGNOSIS — G8929 Other chronic pain: Secondary | ICD-10-CM | POA: Diagnosis not present

## 2024-03-30 DIAGNOSIS — F419 Anxiety disorder, unspecified: Secondary | ICD-10-CM | POA: Insufficient documentation

## 2024-03-30 DIAGNOSIS — M797 Fibromyalgia: Secondary | ICD-10-CM | POA: Insufficient documentation

## 2024-03-30 DIAGNOSIS — D72821 Monocytosis (symptomatic): Secondary | ICD-10-CM | POA: Diagnosis not present

## 2024-03-30 DIAGNOSIS — D649 Anemia, unspecified: Secondary | ICD-10-CM | POA: Diagnosis not present

## 2024-03-30 DIAGNOSIS — Z79899 Other long term (current) drug therapy: Secondary | ICD-10-CM | POA: Diagnosis not present

## 2024-03-30 DIAGNOSIS — Z01818 Encounter for other preprocedural examination: Secondary | ICD-10-CM

## 2024-03-30 DIAGNOSIS — Z5941 Food insecurity: Secondary | ICD-10-CM | POA: Insufficient documentation

## 2024-03-30 DIAGNOSIS — Z59868 Other specified financial insecurity: Secondary | ICD-10-CM | POA: Insufficient documentation

## 2024-03-30 DIAGNOSIS — Z7983 Long term (current) use of bisphosphonates: Secondary | ICD-10-CM | POA: Insufficient documentation

## 2024-03-30 LAB — CBC WITH DIFFERENTIAL/PLATELET
Abs Immature Granulocytes: 0.05 K/uL (ref 0.00–0.07)
Basophils Absolute: 0.1 K/uL (ref 0.0–0.1)
Basophils Relative: 1 %
Eosinophils Absolute: 0.3 K/uL (ref 0.0–0.5)
Eosinophils Relative: 3 %
HCT: 37.3 % (ref 36.0–46.0)
Hemoglobin: 11.7 g/dL — ABNORMAL LOW (ref 12.0–15.0)
Immature Granulocytes: 1 %
Lymphocytes Relative: 26 %
Lymphs Abs: 2.3 K/uL (ref 0.7–4.0)
MCH: 28.1 pg (ref 26.0–34.0)
MCHC: 31.4 g/dL (ref 30.0–36.0)
MCV: 89.4 fL (ref 80.0–100.0)
Monocytes Absolute: 1.1 K/uL — ABNORMAL HIGH (ref 0.1–1.0)
Monocytes Relative: 13 %
Neutro Abs: 5 K/uL (ref 1.7–7.7)
Neutrophils Relative %: 56 %
Platelets: 281 K/uL (ref 150–400)
RBC: 4.17 MIL/uL (ref 3.87–5.11)
RDW: 14.2 % (ref 11.5–15.5)
WBC: 8.8 K/uL (ref 4.0–10.5)
nRBC: 0 % (ref 0.0–0.2)

## 2024-03-30 MED ORDER — SODIUM CHLORIDE 0.9 % IV SOLN: INTRAVENOUS | Status: DC

## 2024-03-30 MED ORDER — MIDAZOLAM HCL (PF) 2 MG/2ML IJ SOLN
INTRAMUSCULAR | Status: AC | PRN
  Administered 2024-03-30: 1 mg via INTRAVENOUS

## 2024-03-30 MED ORDER — MIDAZOLAM HCL (PF) 2 MG/2ML IJ SOLN
INTRAMUSCULAR | Status: AC | PRN
Start: 2024-03-30 — End: 2024-03-30
  Administered 2024-03-30: 1 mg via INTRAVENOUS

## 2024-03-30 MED ORDER — MIDAZOLAM HCL 2 MG/2ML IJ SOLN
INTRAMUSCULAR | Status: AC
  Filled 2024-03-30: qty 2

## 2024-03-30 MED ORDER — FENTANYL CITRATE (PF) 100 MCG/2ML IJ SOLN
INTRAMUSCULAR | Status: AC | PRN
  Administered 2024-03-30 (×2): 50 ug via INTRAVENOUS

## 2024-03-30 MED ORDER — FENTANYL CITRATE (PF) 100 MCG/2ML IJ SOLN
INTRAMUSCULAR | Status: AC
  Filled 2024-03-30: qty 2

## 2024-03-30 NOTE — Discharge Instructions (Addendum)
For questions /concerns may call Interventional Radiology at [phone] or  Interventional Radiology clinic [phone]   You may remove your dressing and shower tomorrow afternoon                Bone Marrow Aspiration and Bone Marrow Biopsy, Adult, Care After The following information offers guidance on how to care for yourself after your procedure. Your health care provider may also give you more specific instructions. If you have problems or questions, contact your health care provider. What can I expect after the procedure? After the procedure, it is common to have: Mild pain and tenderness. Swelling. Bruising. Follow these instructions at home: Incision care  Follow instructions from your health care provider about how to take care of the incision site. Make sure you: Wash your hands with soap and water for at least 20 seconds before and after you change your bandage (dressing). If soap and water are not available, use hand sanitizer. Change your dressing as told by your health care provider. Leave stitches (sutures), skin glue, or adhesive strips in place. These skin closures may need to stay in place for 2 weeks or longer. If adhesive strip edges start to loosen and curl up, you may trim the loose edges. Do not remove adhesive strips completely unless your health care provider tells you to do that. Check your incision site every day for signs of infection. Check for: More redness, swelling, or pain. Fluid or blood. Warmth. Pus or a bad smell. Activity Return to your normal activities as told by your health care provider. Ask your health care provider what activities are safe for you. Do not lift anything that is heavier than 10 lb (4.5 kg), or the limit that you are told, until your health care provider says that it is safe. If you were given a sedative during the procedure, it can affect you for several hours. Do not drive or operate machinery until your health care provider says  that it is safe. General instructions  Take over-the-counter and prescription medicines only as told by your health care provider. Do not take baths, swim, or use a hot tub until your health care provider approves. Ask your health care provider if you may take showers. You may only be allowed to take sponge baths. If directed, put ice on the affected area. To do this: Put ice in a plastic bag. Place a towel between your skin and the bag. Leave the ice on for 20 minutes, 2-3 times a day. If your skin turns bright red, remove the ice right away to prevent skin damage. The risk of skin damage is higher if you cannot feel pain, heat, or cold. Contact a health care provider if: You have signs of infection. Your pain is not controlled with medicine. You have cancer, and a temperature of 100.68F (38C) or higher. Get help right away if: You have a temperature of 101F (38.3C) or higher, or as told by your health care provider. You have bleeding from the incision site that cannot be controlled. This information is not intended to replace advice given to you by your health care provider. Make sure you discuss any questions you have with your health care provider.                                      Moderate Conscious Sedation, Adult, Care After After the procedure, it is  common to have: Sleepiness for a few hours. Impaired judgment for a few hours. Trouble with balance. Nausea or vomiting if you eat too soon. Follow these instructions at home: For the time period you were told by your health care provider:  Rest. Do not participate in activities where you could fall or become injured. Do not drive or use machinery. Do not drink alcohol. Do not take sleeping pills or medicines that cause drowsiness. Do not make important decisions or sign legal documents. Do not take care of children on your own. Eating and drinking Follow instructions from your health care provider about what you may  eat and drink. Drink enough fluid to keep your urine pale yellow. If you vomit: Drink clear fluids slowly and in small amounts as you are able. Clear fluids include water, ice chips, low-calorie sports drinks, and fruit juice that has water added to it (diluted fruit juice). Eat light and bland foods in small amounts as you are able. These foods include bananas, applesauce, rice, lean meats, toast, and crackers. General instructions Take over-the-counter and prescription medicines only as told by your health care provider. Have a responsible adult stay with you for the time you are told. Do not use any products that contain nicotine or tobacco. These products include cigarettes, chewing tobacco, and vaping devices, such as e-cigarettes. If you need help quitting, ask your health care provider. Return to your normal activities as told by your health care provider. Ask your health care provider what activities are safe for you. Your health care provider may give you more instructions. Make sure you know what you can and cannot do. Contact a health care provider if: You are still sleepy or having trouble with balance after 24 hours. You feel light-headed. You vomit every time you eat or drink. You get a rash. You have a fever. You have redness or swelling around the IV site. Get help right away if: You have trouble breathing. You start to feel confused at home. These symptoms may be an emergency. Get help right away. Call 911. Do not wait to see if the symptoms will go away. Do not drive yourself to the hospital. This information is not intended to replace advice given to you by your health care provider. Make sure you discuss any questions you have with your health care provider. Document Revised: 11/16/2021 Document Reviewed: 11/16/2021 Elsevier Patient Education  2024 [employer].

## 2024-03-30 NOTE — Procedures (Signed)
 Vascular and Interventional Radiology Procedure Note  Patient: [name] DOB: 22-Jun-1960 Medical Record Number: [id number] Note Date/Time: 03/30/24 9:33 AM   Performing Physician: Thom Hall, MD Assistant(s): None  Diagnosis: Q Erdheim-Chester Dx    Procedure: BONE MARROW ASPIRATION and BIOPSY  Anesthesia: Conscious Sedation Complications: None Estimated Blood Loss: Minimal Specimens: Sent for Pathology  Findings:  Successful CT-guided bone marrow aspiration and biopsy A total of 1 cores were obtained. Hemostasis of the tract was achieved using Manual Pressure.  Plan: Bed rest for 1 hours.  See detailed procedure note with images in PACS. The patient tolerated the procedure well without incident or complication and was returned to Recovery in stable condition.    Thom Hall, MD [facility]   Pager. [phone] Clinic. [phone]

## 2024-03-30 NOTE — H&P (Signed)
 Chief Complaint: Request for BMBx  Referring Provider(s): Ennever,Peter R   Supervising Physician: Hughes Simmonds  Patient Status: [facility] - Out-pt  History of Present Illness: [name] is a 63 y.o. female with PMH significant for anxiety, anemia, fibromyalgia. She was referred to Dr. 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 Recent PET neg for osseous findings. Bone marrow biopsy with aspiration has been requested from IR as part of ongoing workup.   Confirms NPO since MN and ride/supervision available for 24 hours.  Does not wear CPAP or use supplemental home O2.  Denies fever, chills, SOB, CP, sore throat, N/V, abd pain, blood in stool or urine, abnormal bruising. Endorses baseline joint pain and chronic back pain related to past surgeries.   Allergies Reviewed:  Fish allergy, Other, Nortriptyline, Bee venom, Flexeril [cyclobenzaprine hcl], and Tylenol  [acetaminophen ]   Patient is Full Code  Past Medical History:  Diagnosis Date   Anemia    Anxiety    Arthritis    multiple areas   Bone marrow disease    DX many years ago - pt unsure of name and unable to find info on type of disease   Complication of anesthesia 2011   slow to awaken after hysterectomy   Depression    Dyspnea    if does not take albuterol  she states she can not breath    Family history of adverse reaction to anesthesia    pts mother has nausea and vomiting; pts sons become aggressive and have hallicunations   Fibromyalgia    H/O iron deficiency anemia    Headache    MIGRAINES   History of blood transfusion    day pt was born   History of bronchitis    Hx of pneumothorax    X2 IN PAST   Kidney disease    only has one functioning kidney - no problems  x 23 yrs   Liver failure, acute 5 years ago   cause unknown, after hysterectomy   Neuropathy    Numbness    MULTIPLE AREAS - from all the surgeries I've had   PONV (postoperative nausea and vomiting)     Past Surgical  History:  Procedure Laterality Date   ABDOMINAL HYSTERECTOMY     BACK SURGERY     multiple   CERVICAL  SPINE SURG     SEVERAL TIMES   CERVICAL DISCECTOMY     with fusion   DILATION AND CURETTAGE OF UTERUS     FOOT SURGERY  1989   HERNIA REPAIR  2011   KNEE ARTHROSCOPY WITH DRILLING/MICROFRACTURE Right 03/13/2014   Procedure: RIGHT MICROFRACTURE TECHNIQUE;  Surgeon: Tanda DELENA Heading, MD;  Location: [facility];  Service: Orthopedics;  Laterality: Right;   KNEE ARTHROSCOPY WITH MEDIAL MENISECTOMY Right 03/13/2014   Procedure: RIGHT KNEE ARTHROSCOPY WITH MEDIAL FEMORAL CONDYLE REPAIR;  Surgeon: Tanda DELENA Heading, MD;  Location: [facility];  Service: Orthopedics;  Laterality: Right;   KNEE ARTHROSCOPY WITH MEDIAL PATELLAR FEMORAL LIGAMENT RECONSTRUCTION Right 03/13/2014   Procedure: ABRASION CHONDROPLASTY OF MEDIAL FEMORAL;  Surgeon: Tanda DELENA Heading, MD;  Location: [facility];  Service: Orthopedics;  Laterality: Right;   left knee arthroscopy     polyp removal  2010   uterine   right leg surgery      secondary to trauma   SHOULDER SURGERY     Bilaterally X9 SURGERIES   SYNOVECTOMY Right 03/13/2014   Procedure: SYNOVECTOMY OF THE SUPRAPATELLA POUCH;  Surgeon: Tanda DELENA Heading, MD;  Location: [facility];  Service: Orthopedics;  Laterality: Right;   TONSILLECTOMY     TOTAL KNEE ARTHROPLASTY Right 07/30/2014   Procedure: RIGHT TOTAL KNEE ARTHROPLASTY;  Surgeon: Tanda Heading, MD;  Location: [facility];  Service: Orthopedics;  Laterality: Right;   TOTAL KNEE REVISION WITH SCAR DEBRIDEMENT/PATELLA REVISION WITH POLY EXCHANGE Right 04/05/2016   Procedure: OPEN SCAR DEBRIDEMENT WITH POLY EXCHANGE reviosion of patella;  Surgeon: Donnice Car, MD;  Location: [facility];  Service: Orthopedics;  Laterality: Right;      Medications: Prior to Admission medications   Medication Sig Start Date End Date Taking? Authorizing Provider  albuterol  (PROVENTIL ) 2 MG tablet Take 4 mg by mouth every 8 (eight) hours.   Yes [provider]  alendronate (FOSAMAX) 70 MG tablet Take 70 mg by mouth once a week. 12/29/23  Yes [provider]  busPIRone (BUSPAR) 15 MG tablet    Yes [provider]  celecoxib  (CELEBREX ) 200 MG capsule Take 1 capsule (200 mg total) by mouth every 12 (twelve) hours. Take along with Pepcid  to help avoid GI upset. 04/06/16  Yes Babish, Donnice, PA-C  diazepam  (VALIUM ) 5 MG tablet Take 1 tablet (5 mg total) by mouth every 6 (six) hours as needed (vertigo). 02/10/23  Yes Ray, Nilsa, MD  escitalopram (LEXAPRO) 10 MG tablet Take 10 mg by mouth in the morning.   Yes [provider]  HYDROmorphone  (DILAUDID ) 2 MG tablet Take by mouth.   Yes [provider]  ibuprofen  (ADVIL ) 200 MG tablet Take 400 mg by mouth every 8 (eight) hours as needed (pain.).   Yes [provider]  methocarbamol  (ROBAXIN ) 500 MG tablet Take 500 mg by mouth 2 (two) times daily as needed for muscle spasms.   Yes [provider]  metoCLOPramide  (REGLAN ) 10 MG tablet Take 1 tablet (10 mg total) by mouth 3 (three) times daily with meals. 01/04/24 01/03/25 Yes Triplett, Cari B, FNP  ondansetron  (ZOFRAN ) 8 MG tablet Take 8 mg by mouth 3 (three) times daily as needed for nausea or vomiting. 02/18/16  Yes [provider]  pantoprazole  (PROTONIX ) 40 MG tablet Take 1 tablet (40 mg total) by mouth 2 (two) times daily. 02/15/24  Yes Nandigam, Kavitha V, MD  rOPINIRole  (REQUIP ) 1 MG tablet Take 1 mg by mouth at bedtime.   Yes [provider]  SUMAtriptan  (IMITREX ) 50 MG tablet TAKE 1 TABLET BY MOUTH AS NEEDED FOR MIGRAINE. MAY REPEAT IN 2 HOURS IF HEADACHE PERSISTS OR RECURS. 02/23/24  Yes Camara, Pastor, MD  EPIPEN  2-PAK 0.3 MG/0.3ML SOAJ injection Inject 0.3 mg as directed as needed (allergic reaction).  02/18/16   [provider]  naloxone (NARCAN) nasal spray 4 mg/0.1 mL SQUIRT INTO ONE NOSTRIL IF NEEDED FOR OVER-SEDATION FROM NARCOTICS, MAY REPEAT DOSE 2 TO 3 MINUTES IN  OTHER NOSTRIL IF NEEDED. Patient not taking: Reported on 03/02/2024    [provider]  OZEMPIC, 1 MG/DOSE, 4 MG/3ML SOPN SMARTSIG:1 Milligram(s) Once a Week 01/27/24   [provider]  propranolol  ER (INDERAL  LA) 60 MG 24 hr capsule Take 1 capsule (60 mg total) by mouth daily. 12/08/23   Camara, Amadou, MD  rosuvastatin (CRESTOR) 5 MG tablet Take 5 mg by mouth in the morning.    [provider]  Semaglutide, 2 MG/DOSE, (OZEMPIC, 2 MG/DOSE,) 8 MG/3ML SOPN Inject 2 mg into the skin every Thursday.    [provider]     Family History  Problem Relation Age of Onset  Heart disease Mother    Heart disease Father    Colon cancer Father    Heart disease Maternal Grandfather    Heart disease Maternal Grandmother    Heart disease Paternal Grandfather    Heart disease Paternal Grandmother     Social History   Socioeconomic History   Marital status: Divorced    Spouse name: Not on file   Number of children: 2   Years of education: Not on file   Highest education level: Not on file  Occupational History   Occupation: disabled    Comment: back problems  Tobacco Use   Smoking status: Never   Smokeless tobacco: Never  Vaping Use   Vaping status: Never Used  Substance and Sexual Activity   Alcohol use: No   Drug use: No   Sexual activity: Not on file  Other Topics Concern   Not on file  Social History Narrative   Divorced and lives with her youngest son. Disabled with back problems.   Right handed   Caffeine-1 cup daily   Social Drivers of Health   Financial Resource Strain: High Risk (05/30/2023)   Received from [facility]   Overall Financial Resource Strain (CARDIA)    Difficulty of Paying Living Expenses: Very hard  Food Insecurity: Food Insecurity Present (05/30/2023)   Received from [facility]   Hunger Vital Sign    Within the past 12 months, you worried that your food would run out before you got the  money to buy more.: Often true    Within the past 12 months, the food you bought just didn't last and you didn't have money to get more.: Often true  Transportation Needs: No Transportation Needs (05/30/2023)   Received from [facility] - Transportation    In the past 12 months, has lack of transportation kept you from medical appointments or from getting medications?: No    Lack of Transportation (Non-Medical): No  Physical Activity: Not on file  Stress: Not on file  Social Connections: Not on file     Review of Systems: A 12 point ROS discussed and pertinent positives are indicated in the HPI above.  All other systems are negative.    Vital Signs: BP (!) 145/79   Pulse 75   Temp 98.8 F (37.1 C) (Oral)   Resp 16   Ht 5' 4 (1.626 m)   Wt 210 lb (95.3 kg)   SpO2 95%   BMI 36.05 kg/m     Physical Exam HENT:     Mouth/Throat:     Mouth: Mucous membranes are moist.     Pharynx: Oropharynx is clear.  Cardiovascular:     Rate and Rhythm: Normal rate and regular rhythm.     Pulses: Normal pulses.     Heart sounds: Normal heart sounds.  Pulmonary:     Effort: Pulmonary effort is normal.     Breath sounds: Normal breath sounds.  Musculoskeletal:     Comments: No rash or wound over planned puncture region  Skin:    General: Skin is warm and dry.  Neurological:     Mental Status: She is alert and oriented to person, place, and time.  Psychiatric:        Mood and Affect: Mood normal.        Behavior: Behavior normal.        Thought Content: Thought content normal.        Judgment: Judgment  normal.     Imaging: No results found.  Labs:  CBC: Recent Labs    01/05/24 1407 02/14/24 0942 03/02/24 1440 03/30/24 0732  WBC 7.3 6.5 7.4 8.8  HGB 13.2 12.6 12.9 11.7*  HCT 41.3 38.9 40.4 37.3  PLT 269 289.0 330 281    COAGS: No results for input(s): INR, APTT in the last 8760 hours.  BMP: Recent Labs    01/04/24 1608  01/05/24 1407 02/14/24 0942 03/02/24 1440  NA 144 143 141 144  K 3.5 3.6 3.9 3.8  CL 106 105 105 108  CO2 27 26 29 25   GLUCOSE 103* 88 86 107*  BUN 13 12 11  7*  CALCIUM 8.9 9.0 9.1 9.3  CREATININE 0.83 0.69 0.68 0.68  GFRNONAA >60 >60  --  >60    LIVER FUNCTION TESTS: Recent Labs    01/04/24 1608 01/05/24 1407 02/14/24 0942 03/02/24 1440  BILITOT 0.2 0.3 0.5 0.4  AST 29 24 13 28   ALT 30 27 8 13   ALKPHOS 103 123 107 125  PROT 7.3 7.3 7.1 7.1  ALBUMIN 3.9 4.3 4.2 4.4    TUMOR MARKERS: No results for input(s): AFPTM, CEA, CA199, CHROMGRNA in the last 8760 hours.  Assessment and Plan:  Request for  image guided BMBx and asp No contraindications for procedure identified in ROS, physical exam, or review of pre-sedation considerations. Same day CBC w/ diff will be obtained VSS, afebrile Patient not asked to hold any AC/AP for this low bleeding risk procedure Abx not indicated    Risks and benefits of image guided BMBx was discussed with the patient and/or patient's family including, but not limited to bleeding, infection, damage to adjacent structures or low yield requiring additional tests.  All of the questions were answered and there is agreement to proceed.  Consent signed and in chart.   Thank you for allowing our service to participate in [name] 's care.    Electronically Signed: Laymon Coast, NP   03/30/2024, 8:29 AM     I spent a total of  15 Minutes   in face to face in clinical consultation, greater than 50% of which was counseling/coordinating care for image guided BMBx.   (A copy of this note was sent to the referring provider and the time of visit.)

## 2024-04-03 ENCOUNTER — Ambulatory Visit: Payer: Self-pay | Admitting: Hematology & Oncology

## 2024-04-03 ENCOUNTER — Ambulatory Visit (AMBULATORY_SURGERY_CENTER): Admitting: Gastroenterology

## 2024-04-03 ENCOUNTER — Encounter: Payer: Self-pay | Admitting: Gastroenterology

## 2024-04-03 VITALS — BP 122/80 | HR 77 | Temp 97.7°F | Resp 13 | Ht 64.0 in | Wt 209.0 lb

## 2024-04-03 DIAGNOSIS — Z1211 Encounter for screening for malignant neoplasm of colon: Secondary | ICD-10-CM

## 2024-04-03 DIAGNOSIS — K5730 Diverticulosis of large intestine without perforation or abscess without bleeding: Secondary | ICD-10-CM

## 2024-04-03 DIAGNOSIS — K644 Residual hemorrhoidal skin tags: Secondary | ICD-10-CM

## 2024-04-03 DIAGNOSIS — D123 Benign neoplasm of transverse colon: Secondary | ICD-10-CM

## 2024-04-03 DIAGNOSIS — Z860101 Personal history of adenomatous and serrated colon polyps: Secondary | ICD-10-CM | POA: Diagnosis not present

## 2024-04-03 DIAGNOSIS — Z860100 Personal history of colon polyps, unspecified: Secondary | ICD-10-CM

## 2024-04-03 DIAGNOSIS — K648 Other hemorrhoids: Secondary | ICD-10-CM

## 2024-04-03 LAB — SURGICAL PATHOLOGY

## 2024-04-03 MED ORDER — SODIUM CHLORIDE 0.9 % IV SOLN: 500.0000 mL | Freq: Once | INTRAVENOUS | Status: DC

## 2024-04-03 NOTE — Patient Instructions (Signed)
Discharge instructions given. Handouts on polyps and Diverticulosis. Resume previous medications. YOU HAD AN ENDOSCOPIC PROCEDURE TODAY AT THE [facility]:   Refer to the procedure report that was given to you for any specific questions about what was found during the examination.  If the procedure report does not answer your questions, please call your gastroenterologist to clarify.  If you requested that your care partner not be given the details of your procedure findings, then the procedure report has been included in a sealed envelope for you to review at your convenience later.  YOU SHOULD EXPECT: Some feelings of bloating in the abdomen. Passage of more gas than usual.  Walking can help get rid of the air that was put into your GI tract during the procedure and reduce the bloating. If you had a lower endoscopy (such as a colonoscopy or flexible sigmoidoscopy) you may notice spotting of blood in your stool or on the toilet paper. If you underwent a bowel prep for your procedure, you may not have a normal bowel movement for a few days.  Please Note:  You might notice some irritation and congestion in your nose or some drainage.  This is from the oxygen used during your procedure.  There is no need for concern and it should clear up in a day or so.  SYMPTOMS TO REPORT IMMEDIATELY:  Following lower endoscopy (colonoscopy or flexible sigmoidoscopy):  Excessive amounts of blood in the stool  Significant tenderness or worsening of abdominal pains  Swelling of the abdomen that is new, acute  Fever of 100F or higher  For urgent or emergent issues, a gastroenterologist can be reached at any hour by calling [phone]. Do not use MyChart messaging for urgent concerns.    DIET:  We do recommend a small meal at first, but then you may proceed to your regular diet.  Drink plenty of fluids but you should avoid alcoholic beverages for 24 hours.  ACTIVITY:  You should plan to take it  easy for the rest of today and you should NOT DRIVE or use heavy machinery until tomorrow (because of the sedation medicines used during the test).    FOLLOW UP: Our staff will call the number listed on your records the next business day following your procedure.  We will call around 7:15- 8:00 am to check on you and address any questions or concerns that you may have regarding the information given to you following your procedure. If we do not reach you, we will leave a message.     If any biopsies were taken you will be contacted by phone or by letter within the next 1-3 weeks.  Please call us at [phone] if you have not heard about the biopsies in 3 weeks.    SIGNATURES/CONFIDENTIALITY: You and/or your care partner have signed paperwork which will be entered into your electronic medical record.  These signatures attest to the fact that that the information above on your After Visit Summary has been reviewed and is understood.  Full responsibility of the confidentiality of this discharge information lies with you and/or your care-partner. 

## 2024-04-03 NOTE — Progress Notes (Unsigned)
 Pt's states no medical or surgical changes since previsit or office visit.  Pt was given instructions for a 2 day prep with Sutab  and Miralax . Pt only took the Sutabs. Pt also ate beef tips and rice at 6pm yesterday. Reports stools are a clear, yellow water . Denies hx of constipation.

## 2024-04-03 NOTE — Progress Notes (Signed)
 Called to room to assist during endoscopic procedure.  Patient ID and intended procedure confirmed with present staff. Received instructions for my participation in the procedure from the performing physician.

## 2024-04-03 NOTE — Progress Notes (Unsigned)
 [facility] History and Physical   Primary Care Physician:  Loring Tanda Mae, MD   Reason for Procedure:  History of adenomatous colon polyps  Plan:    Surveillance colonoscopy with possible interventions as needed     HPI: [name] is a very pleasant 63 y.o. female here for surveillance colonoscopy. Denies any nausea, vomiting, abdominal pain, melena or bright red blood per rectum  The risks and benefits as well as alternatives of endoscopic procedure(s) have been discussed and reviewed.  The patient was provided an opportunity to ask questions and all were answered. The patient agreed with the plan and demonstrated an understanding of the instructions.   Past Medical History:  Diagnosis Date   Anemia    Anxiety    Arthritis    multiple areas   Bone marrow disease    DX many years ago - pt unsure of name and unable to find info on type of disease   Complication of anesthesia 2011   slow to awaken after hysterectomy   Depression    Dyspnea    if does not take albuterol  she states she can not breath    Family history of adverse reaction to anesthesia    pts mother has nausea and vomiting; pts sons become aggressive and have hallicunations   Fibromyalgia    H/O iron deficiency anemia    Headache    MIGRAINES   History of blood transfusion    day pt was born   History of bronchitis    Hx of pneumothorax    X2 IN PAST   Kidney disease    only has one functioning kidney - no problems  x 23 yrs   Liver failure, acute 5 years ago   cause unknown, after hysterectomy   Neuropathy    Numbness    MULTIPLE AREAS - from all the surgeries I've had   PONV (postoperative nausea and vomiting)     Past Surgical History:  Procedure Laterality Date   ABDOMINAL HYSTERECTOMY     BACK SURGERY     multiple   CERVICAL  SPINE SURG     SEVERAL TIMES   CERVICAL DISCECTOMY     with fusion   DILATION AND CURETTAGE OF UTERUS     FOOT SURGERY  1989   HERNIA  REPAIR  2011   KNEE ARTHROSCOPY WITH DRILLING/MICROFRACTURE Right 03/13/2014   Procedure: RIGHT MICROFRACTURE TECHNIQUE;  Surgeon: Tanda DELENA Heading, MD;  Location: [facility];  Service: Orthopedics;  Laterality: Right;   KNEE ARTHROSCOPY WITH MEDIAL MENISECTOMY Right 03/13/2014   Procedure: RIGHT KNEE ARTHROSCOPY WITH MEDIAL FEMORAL CONDYLE REPAIR;  Surgeon: Tanda DELENA Heading, MD;  Location: [facility];  Service: Orthopedics;  Laterality: Right;   KNEE ARTHROSCOPY WITH MEDIAL PATELLAR FEMORAL LIGAMENT RECONSTRUCTION Right 03/13/2014   Procedure: ABRASION CHONDROPLASTY OF MEDIAL FEMORAL;  Surgeon: Tanda DELENA Heading, MD;  Location: [facility];  Service: Orthopedics;  Laterality: Right;   left knee arthroscopy     polyp removal  2010   uterine   right leg surgery      secondary to trauma   SHOULDER SURGERY     Bilaterally X9 SURGERIES   SYNOVECTOMY Right 03/13/2014   Procedure: SYNOVECTOMY OF THE SUPRAPATELLA POUCH;  Surgeon: Tanda DELENA Heading, MD;  Location: [facility];  Service: Orthopedics;  Laterality: Right;   TONSILLECTOMY     TOTAL KNEE ARTHROPLASTY Right 07/30/2014   Procedure: RIGHT TOTAL KNEE ARTHROPLASTY;  Surgeon: Tanda Heading, MD;  Location: [facility];  Service: Orthopedics;  Laterality: Right;   TOTAL KNEE REVISION WITH SCAR DEBRIDEMENT/PATELLA REVISION WITH POLY EXCHANGE Right 04/05/2016   Procedure: OPEN SCAR DEBRIDEMENT WITH POLY EXCHANGE reviosion of patella;  Surgeon: Donnice Car, MD;  Location: [facility];  Service: Orthopedics;  Laterality: Right;    Prior to Admission medications   Medication Sig Start Date End Date Taking? Authorizing Provider  albuterol  (PROVENTIL ) 2 MG tablet Take 4 mg by mouth every 8 (eight) hours.   Yes [provider]  alendronate (FOSAMAX) 70 MG tablet Take 70 mg by mouth once a week. 12/29/23  Yes [provider]  busPIRone (BUSPAR) 15 MG tablet    Yes [provider]  celecoxib  (CELEBREX ) 200 MG capsule Take 1 capsule (200 mg total) by mouth  every 12 (twelve) hours. Take along with Pepcid  to help avoid GI upset. 04/06/16  Yes Babish, Donnice, PA-C  escitalopram (LEXAPRO) 10 MG tablet Take 10 mg by mouth in the morning.   Yes [provider]  HYDROmorphone  (DILAUDID ) 2 MG tablet Take by mouth.   Yes [provider]  metoCLOPramide  (REGLAN ) 10 MG tablet Take 1 tablet (10 mg total) by mouth 3 (three) times daily with meals. 01/04/24 01/03/25 Yes Triplett, Cari B, FNP  ondansetron  (ZOFRAN ) 8 MG tablet Take 8 mg by mouth 3 (three) times daily as needed for nausea or vomiting. 02/18/16  Yes [provider]  pantoprazole  (PROTONIX ) 40 MG tablet Take 1 tablet (40 mg total) by mouth 2 (two) times daily. 02/15/24  Yes Porschia Willbanks V, MD  propranolol  ER (INDERAL  LA) 60 MG 24 hr capsule Take 1 capsule (60 mg total) by mouth daily. 12/08/23  Yes Gregg Lek, MD  rOPINIRole  (REQUIP ) 1 MG tablet Take 1 mg by mouth at bedtime.   Yes [provider]  SUMAtriptan  (IMITREX ) 50 MG tablet TAKE 1 TABLET BY MOUTH AS NEEDED FOR MIGRAINE. MAY REPEAT IN 2 HOURS IF HEADACHE PERSISTS OR RECURS. 02/23/24  Yes Gregg Lek, MD  diazepam  (VALIUM ) 5 MG tablet Take 1 tablet (5 mg total) by mouth every 6 (six) hours as needed (vertigo). 02/10/23   Levander Slate, MD  EPIPEN  2-PAK 0.3 MG/0.3ML SOAJ injection Inject 0.3 mg as directed as needed (allergic reaction).  02/18/16   [provider]  ibuprofen  (ADVIL ) 200 MG tablet Take 400 mg by mouth every 8 (eight) hours as needed (pain.).    [provider]  methocarbamol  (ROBAXIN ) 500 MG tablet Take 500 mg by mouth 2 (two) times daily as needed for muscle spasms.    [provider]  naloxone (NARCAN) nasal spray 4 mg/0.1 mL SQUIRT INTO ONE NOSTRIL IF NEEDED FOR OVER-SEDATION FROM NARCOTICS, MAY REPEAT DOSE 2 TO 3 MINUTES IN OTHER NOSTRIL IF NEEDED. Patient not taking: No sig reported    [provider]    Current Outpatient Medications  Medication Sig  Dispense Refill   albuterol  (PROVENTIL ) 2 MG tablet Take 4 mg by mouth every 8 (eight) hours.     alendronate (FOSAMAX) 70 MG tablet Take 70 mg by mouth once a week.     busPIRone (BUSPAR) 15 MG tablet      celecoxib  (CELEBREX ) 200 MG capsule Take 1 capsule (200 mg total) by mouth every 12 (twelve) hours. Take along with Pepcid  to help avoid GI upset. 60 capsule 0   escitalopram (LEXAPRO) 10 MG tablet Take 10 mg by mouth in the morning.     HYDROmorphone  (DILAUDID ) 2 MG tablet Take by mouth.  metoCLOPramide  (REGLAN ) 10 MG tablet Take 1 tablet (10 mg total) by mouth 3 (three) times daily with meals. 90 tablet 1   ondansetron  (ZOFRAN ) 8 MG tablet Take 8 mg by mouth 3 (three) times daily as needed for nausea or vomiting.     pantoprazole  (PROTONIX ) 40 MG tablet Take 1 tablet (40 mg total) by mouth 2 (two) times daily. 180 tablet 3   propranolol  ER (INDERAL  LA) 60 MG 24 hr capsule Take 1 capsule (60 mg total) by mouth daily. 90 capsule 3   rOPINIRole  (REQUIP ) 1 MG tablet Take 1 mg by mouth at bedtime.     SUMAtriptan  (IMITREX ) 50 MG tablet TAKE 1 TABLET BY MOUTH AS NEEDED FOR MIGRAINE. MAY REPEAT IN 2 HOURS IF HEADACHE PERSISTS OR RECURS. 10 tablet 1   diazepam  (VALIUM ) 5 MG tablet Take 1 tablet (5 mg total) by mouth every 6 (six) hours as needed (vertigo). 20 tablet 0   EPIPEN  2-PAK 0.3 MG/0.3ML SOAJ injection Inject 0.3 mg as directed as needed (allergic reaction).      ibuprofen  (ADVIL ) 200 MG tablet Take 400 mg by mouth every 8 (eight) hours as needed (pain.).     methocarbamol  (ROBAXIN ) 500 MG tablet Take 500 mg by mouth 2 (two) times daily as needed for muscle spasms.     naloxone (NARCAN) nasal spray 4 mg/0.1 mL SQUIRT INTO ONE NOSTRIL IF NEEDED FOR OVER-SEDATION FROM NARCOTICS, MAY REPEAT DOSE 2 TO 3 MINUTES IN OTHER NOSTRIL IF NEEDED. (Patient not taking: No sig reported)     Current Facility-Administered Medications  Medication Dose Route Frequency Provider Last Rate Last Admin   0.9 %   sodium chloride  infusion  500 mL Intravenous Once Marletta Bousquet V, MD        Allergies as of 04/03/2024 - Review Complete 04/03/2024  Allergen Reaction Noted   Fish allergy Anaphylaxis 02/14/2013   Other Shortness Of Breath 03/24/2016   Nortriptyline Other (See Comments) 08/13/2010   Bee venom Hives 02/14/2013   Flexeril [cyclobenzaprine hcl] Other (See Comments) 08/13/2010   Tylenol  [acetaminophen ] Other (See Comments) 07/25/2014    Family History  Problem Relation Age of Onset   Heart disease Mother    Heart disease Father    Colon cancer Father        pt unsure if it was colon or prostate   Heart disease Maternal Grandmother    Heart disease Maternal Grandfather    Heart disease Paternal Grandmother    Heart disease Paternal Grandfather    Esophageal cancer Neg Hx    Rectal cancer Neg Hx    Stomach cancer Neg Hx     Social History   Socioeconomic History   Marital status: Divorced    Spouse name: Not on file   Number of children: 2   Years of education: Not on file   Highest education level: Not on file  Occupational History   Occupation: disabled    Comment: back problems  Tobacco Use   Smoking status: Never   Smokeless tobacco: Never  Vaping Use   Vaping status: Never Used  Substance and Sexual Activity   Alcohol use: No   Drug use: No   Sexual activity: Not on file  Other Topics Concern   Not on file  Social History Narrative   Divorced and lives with her youngest son. Disabled with back problems.   Right handed   Caffeine-1 cup daily   Social Drivers of Health   Financial Resource Strain: High Risk (  05/30/2023)   Received from [facility]   Overall Financial Resource Strain (CARDIA)    Difficulty of Paying Living Expenses: Very hard  Food Insecurity: Food Insecurity Present (05/30/2023)   Received from [facility]   Hunger Vital Sign    Within the past 12 months, you worried that your food would run out  before you got the money to buy more.: Often true    Within the past 12 months, the food you bought just didn't last and you didn't have money to get more.: Often true  Transportation Needs: No Transportation Needs (05/30/2023)   Received from [facility] - Transportation    In the past 12 months, has lack of transportation kept you from medical appointments or from getting medications?: No    Lack of Transportation (Non-Medical): No  Physical Activity: Not on file  Stress: Not on file  Social Connections: Not on file  Intimate Partner Violence: Not on file    Review of Systems:  All other review of systems negative except as mentioned in the HPI.  Physical Exam: Vital signs in last 24 hours: BP (!) 151/88   Pulse 77   Temp 97.7 F (36.5 C) (Temporal)   Ht 5' 4 (1.626 m)   Wt 209 lb (94.8 kg)   SpO2 97%   BMI 35.87 kg/m  General:   Alert, NAD Lungs:  Clear .   Heart:  Regular rate and rhythm Abdomen:  Soft, nontender and nondistended. Neuro/Psych:  Alert and cooperative. Normal mood and affect. A and O x 3  Reviewed labs, radiology imaging, old records and pertinent past GI work up  Patient is appropriate for planned procedure(s) and anesthesia in an ambulatory setting   K. Veena Edmonia Gonser , MD [phone]

## 2024-04-03 NOTE — Progress Notes (Unsigned)
 Vss nad trans to pacu

## 2024-04-03 NOTE — Op Note (Signed)
 [facility] Patient Name: [name] Procedure Date: 04/03/2024 2:52 PM MRN: [id number] Endoscopist: Gustav ALONSO Mcgee , MD, [id number] Age: 63 Referring MD:  Date of Birth: [birth year] Gender: Female Account #: [id number] Procedure:                Colonoscopy Indications:              High risk colon cancer surveillance: Personal                            history of colonic polyps Medicines:                Monitored Anesthesia Care Procedure:                Pre-Anesthesia Assessment:                           - Prior to the procedure, a History and Physical                            was performed, and patient medications and                            allergies were reviewed. The patient's tolerance of                            previous anesthesia was also reviewed. The risks                            and benefits of the procedure and the sedation                            options and risks were discussed with the patient.                            All questions were answered, and informed consent                            was obtained. Prior Anticoagulants: The patient has                            taken no anticoagulant or antiplatelet agents. ASA                            Grade Assessment: III - A patient with severe                            systemic disease. After reviewing the risks and                            benefits, the patient was deemed in satisfactory                            condition to undergo the procedure.  After obtaining informed consent, the colonoscope                            was passed under direct vision. Throughout the                            procedure, the patient's blood pressure, pulse, and                            oxygen saturations were monitored continuously. The                            Olympus Scope [id number] was introduced through the                            anus and advanced to  the the cecum, identified by                            appendiceal orifice and ileocecal valve. The                            colonoscopy was performed without difficulty. The                            patient tolerated the procedure well. The quality                            of the bowel preparation was good. The ileocecal                            valve, appendiceal orifice, and rectum were                            photographed. Scope In: 3:03:42 PM Scope Out: 3:17:37 PM Scope Withdrawal Time: 0 hours 10 minutes 11 seconds  Total Procedure Duration: 0 hours 13 minutes 55 seconds  Findings:                 The perianal and digital rectal examinations were                            normal.                           A 3 mm polyp was found in the transverse colon. The                            polyp was sessile. The polyp was removed with a                            cold snare. Resection and retrieval were complete.                           A few small-mouthed diverticula were found in the  sigmoid colon and descending colon.                           Non-bleeding external and internal hemorrhoids were                            found during retroflexion. The hemorrhoids were                            medium-sized. Complications:            No immediate complications. Estimated Blood Loss:     Estimated blood loss was minimal. Impression:               - One 3 mm polyp in the transverse colon, removed                            with a cold snare. Resected and retrieved.                           - Diverticulosis in the sigmoid colon and in the                            descending colon.                           - Non-bleeding external and internal hemorrhoids. Recommendation:           - Patient has a contact number available for                            emergencies. The signs and symptoms of potential                            delayed  complications were discussed with the                            patient. Return to normal activities tomorrow.                            Written discharge instructions were provided to the                            patient.                           - Resume previous diet.                           - Continue present medications.                           - Await pathology results.                           - Repeat colonoscopy in 5-10 years for surveillance  based on pathology results. Tehillah Cipriani V. Audriella Blakeley, MD 04/03/2024 3:25:08 PM This report has been signed electronically.

## 2024-04-04 ENCOUNTER — Telehealth: Payer: Self-pay | Admitting: *Deleted

## 2024-04-04 NOTE — Telephone Encounter (Signed)
  Follow up Call-     04/03/2024    1:48 PM 02/15/2024   12:59 PM  Call back number  Post procedure Call Back phone  # [phone]  Permission to leave phone message Yes Yes   Left message to call back if any questions or concerns

## 2024-04-05 ENCOUNTER — Inpatient Hospital Stay: Attending: Hematology & Oncology

## 2024-04-05 ENCOUNTER — Other Ambulatory Visit: Payer: Self-pay

## 2024-04-05 ENCOUNTER — Inpatient Hospital Stay (HOSPITAL_BASED_OUTPATIENT_CLINIC_OR_DEPARTMENT_OTHER): Admitting: Hematology & Oncology

## 2024-04-05 ENCOUNTER — Encounter: Payer: Self-pay | Admitting: Hematology & Oncology

## 2024-04-05 VITALS — BP 151/75 | HR 63 | Temp 98.8°F | Resp 18 | Ht 64.0 in | Wt 215.0 lb

## 2024-04-05 DIAGNOSIS — M810 Age-related osteoporosis without current pathological fracture: Secondary | ICD-10-CM | POA: Insufficient documentation

## 2024-04-05 DIAGNOSIS — M898X9 Other specified disorders of bone, unspecified site: Secondary | ICD-10-CM | POA: Diagnosis present

## 2024-04-05 DIAGNOSIS — M8080XA Other osteoporosis with current pathological fracture, unspecified site, initial encounter for fracture: Secondary | ICD-10-CM

## 2024-04-05 DIAGNOSIS — E8889 Other specified metabolic disorders: Secondary | ICD-10-CM

## 2024-04-05 DIAGNOSIS — E611 Iron deficiency: Secondary | ICD-10-CM | POA: Insufficient documentation

## 2024-04-05 DIAGNOSIS — Z79899 Other long term (current) drug therapy: Secondary | ICD-10-CM | POA: Diagnosis not present

## 2024-04-05 DIAGNOSIS — F3289 Other specified depressive episodes: Secondary | ICD-10-CM

## 2024-04-05 DIAGNOSIS — M545 Low back pain, unspecified: Secondary | ICD-10-CM

## 2024-04-05 LAB — CBC WITH DIFFERENTIAL (CANCER CENTER ONLY)
Abs Immature Granulocytes: 0.04 K/uL (ref 0.00–0.07)
Basophils Absolute: 0.1 K/uL (ref 0.0–0.1)
Basophils Relative: 1 %
Eosinophils Absolute: 0.3 K/uL (ref 0.0–0.5)
Eosinophils Relative: 3 %
HCT: 38.8 % (ref 36.0–46.0)
Hemoglobin: 12.2 g/dL (ref 12.0–15.0)
Immature Granulocytes: 0 %
Lymphocytes Relative: 24 %
Lymphs Abs: 2.2 K/uL (ref 0.7–4.0)
MCH: 28 pg (ref 26.0–34.0)
MCHC: 31.4 g/dL (ref 30.0–36.0)
MCV: 89.2 fL (ref 80.0–100.0)
Monocytes Absolute: 1.2 K/uL — ABNORMAL HIGH (ref 0.1–1.0)
Monocytes Relative: 13 %
Neutro Abs: 5.3 K/uL (ref 1.7–7.7)
Neutrophils Relative %: 59 %
Platelet Count: 335 K/uL (ref 150–400)
RBC: 4.35 MIL/uL (ref 3.87–5.11)
RDW: 14.1 % (ref 11.5–15.5)
WBC Count: 9 K/uL (ref 4.0–10.5)
nRBC: 0 % (ref 0.0–0.2)

## 2024-04-05 LAB — CMP (CANCER CENTER ONLY)
ALT: 12 U/L (ref 0–44)
AST: 17 U/L (ref 15–41)
Albumin: 4.1 g/dL (ref 3.5–5.0)
Alkaline Phosphatase: 110 U/L (ref 38–126)
Anion gap: 10 (ref 5–15)
BUN: 11 mg/dL (ref 8–23)
CO2: 25 mmol/L (ref 22–32)
Calcium: 9 mg/dL (ref 8.9–10.3)
Chloride: 108 mmol/L (ref 98–111)
Creatinine: 0.77 mg/dL (ref 0.44–1.00)
GFR, Estimated: >60 mL/min (ref >60)
Glucose, Bld: 108 mg/dL — ABNORMAL HIGH (ref 70–99)
Potassium: 4.1 mmol/L (ref 3.5–5.1)
Sodium: 143 mmol/L (ref 135–145)
Total Bilirubin: 0.6 mg/dL (ref 0.0–1.2)
Total Protein: 6.9 g/dL (ref 6.5–8.1)

## 2024-04-05 LAB — TSH: TSH: 1.74 u[IU]/mL (ref 0.350–4.500)

## 2024-04-05 LAB — LACTATE DEHYDROGENASE: LDH: 213 U/L (ref 105–235)

## 2024-04-05 NOTE — Progress Notes (Signed)
 Hematology and Oncology Follow Up Visit  [name] [id number] 1961-02-07 63 y.o. 04/05/2024   Principle Diagnosis:  Severe bony pain  Current Therapy:   Observation     Interim History:  [name] is back for her follow-up.  Thankfully, we have not found any evidence of Erdheim-Chester disease.  We actually had a bone marrow biopsy that was done for her.  This was done on 03/20/2024.  The pathology report ([id number]) showed essentially normal bone marrow.  Special studies were done for the Erdheim-Chester disease.  All the studies came back negative.  She still has a lot of pain.  I am just not sure what else we try for her.  I know she has osteoporosis.  I do not know if some of this pain might be from osteoporosis.  I think would be worthwhile is trying her on Meridia.  She has a good calcium level.  Her renal function is fine.  I talked to her about this.  She would be willing to try Meridia.  I do not think she has seen a Rheumatologist yet.  This may not be a bad idea for her..  She still has quite a bit of pain.  I know she is try to get around and be as active as possible.  She has had no fever.  She has had no bleeding.  There is been no change in bowel or bladder habits.  Overall, I will say that her performance status is probably ECOG 1.     Medications:  Current Outpatient Medications:    rosuvastatin (CRESTOR) 5 MG tablet, Take 5 mg by mouth daily., Disp: , Rfl:    albuterol  (PROVENTIL ) 2 MG tablet, Take 4 mg by mouth every 8 (eight) hours., Disp: , Rfl:    alendronate (FOSAMAX) 70 MG tablet, Take 70 mg by mouth once a week., Disp: , Rfl:    busPIRone (BUSPAR) 15 MG tablet, , Disp: , Rfl:    celecoxib  (CELEBREX ) 200 MG capsule, Take 1 capsule (200 mg total) by mouth every 12 (twelve) hours. Take along with Pepcid  to help avoid GI upset., Disp: 60 capsule, Rfl: 0   diazepam  (VALIUM ) 5 MG tablet, Take 1 tablet (5 mg total) by mouth every 6 (six) hours as needed  (vertigo)., Disp: 20 tablet, Rfl: 0   EPIPEN  2-PAK 0.3 MG/0.3ML SOAJ injection, Inject 0.3 mg as directed as needed (allergic reaction). , Disp: , Rfl:    escitalopram (LEXAPRO) 10 MG tablet, Take 10 mg by mouth in the morning., Disp: , Rfl:    HYDROmorphone  (DILAUDID ) 2 MG tablet, Take by mouth., Disp: , Rfl:    ibuprofen  (ADVIL ) 200 MG tablet, Take 400 mg by mouth every 8 (eight) hours as needed (pain.)., Disp: , Rfl:    methocarbamol  (ROBAXIN ) 500 MG tablet, Take 500 mg by mouth 2 (two) times daily as needed for muscle spasms., Disp: , Rfl:    metoCLOPramide  (REGLAN ) 10 MG tablet, Take 1 tablet (10 mg total) by mouth 3 (three) times daily with meals., Disp: 90 tablet, Rfl: 1   naloxone (NARCAN) nasal spray 4 mg/0.1 mL, SQUIRT INTO ONE NOSTRIL IF NEEDED FOR OVER-SEDATION FROM NARCOTICS, MAY REPEAT DOSE 2 TO 3 MINUTES IN OTHER NOSTRIL IF NEEDED. (Patient not taking: No sig reported), Disp: , Rfl:    ondansetron  (ZOFRAN ) 8 MG tablet, Take 8 mg by mouth 3 (three) times daily as needed for nausea or vomiting., Disp: , Rfl:    pantoprazole  (PROTONIX ) 40 MG  tablet, Take 1 tablet (40 mg total) by mouth 2 (two) times daily., Disp: 180 tablet, Rfl: 3   propranolol  ER (INDERAL  LA) 60 MG 24 hr capsule, Take 1 capsule (60 mg total) by mouth daily., Disp: 90 capsule, Rfl: 3   rOPINIRole  (REQUIP ) 1 MG tablet, Take 1 mg by mouth at bedtime., Disp: , Rfl:    SUMAtriptan  (IMITREX ) 50 MG tablet, TAKE 1 TABLET BY MOUTH AS NEEDED FOR MIGRAINE. MAY REPEAT IN 2 HOURS IF HEADACHE PERSISTS OR RECURS., Disp: 10 tablet, Rfl: 1  Allergies:  Allergies  Allergen Reactions   Fish Allergy Anaphylaxis    To all fish, not just shellfish.  Cannot tolerate even the smell of seafood without her airway swelling/closing.  Fish (substance)   Other Shortness Of Breath    Whipped cream and pudding   Nortriptyline Other (See Comments)    hallucinations   Bee Venom Hives   Flexeril [Cyclobenzaprine Hcl] Other (See Comments)     Intense anger   Tylenol  [Acetaminophen ] Other (See Comments)    Cannot take due to past liver failure    Past Medical History, Surgical history, Social history, and Family History were reviewed and updated.  Review of Systems: Review of Systems  Constitutional:  Positive for unexpected weight change.  HENT:  Negative.    Eyes: Negative.   Respiratory: Negative.    Cardiovascular: Negative.   Gastrointestinal: Negative.   Endocrine: Negative.   Genitourinary: Negative.    Musculoskeletal:  Positive for arthralgias and myalgias.  Neurological: Negative.   Hematological: Negative.   Psychiatric/Behavioral: Negative.      Physical Exam:  height is 5' 4 (1.626 m) and weight is 215 lb (97.5 kg). Her oral temperature is 98.8 F (37.1 C). Her blood pressure is 151/75 (abnormal) and her pulse is 63. Her respiration is 18 and oxygen saturation is 98%.   Wt Readings from Last 3 Encounters:  04/05/24 215 lb (97.5 kg)  04/03/24 209 lb (94.8 kg)  03/30/24 210 lb (95.3 kg)    Physical Exam Vitals reviewed.  HENT:     Head: Normocephalic and atraumatic.  Eyes:     Pupils: Pupils are equal, round, and reactive to light.  Cardiovascular:     Rate and Rhythm: Normal rate and regular rhythm.     Heart sounds: Normal heart sounds.  Pulmonary:     Effort: Pulmonary effort is normal.     Breath sounds: Normal breath sounds.  Abdominal:     General: Bowel sounds are normal.     Palpations: Abdomen is soft.  Musculoskeletal:        General: No tenderness or deformity. Normal range of motion.     Cervical back: Normal range of motion.  Lymphadenopathy:     Cervical: No cervical adenopathy.  Skin:    General: Skin is warm and dry.     Findings: No erythema or rash.  Neurological:     Mental Status: She is alert and oriented to person, place, and time.  Psychiatric:        Behavior: Behavior normal.        Thought Content: Thought content normal.        Judgment: Judgment normal.       Lab Results  Component Value Date   WBC 9.0 04/05/2024   HGB 12.2 04/05/2024   HCT 38.8 04/05/2024   MCV 89.2 04/05/2024   PLT 335 04/05/2024     Chemistry      Component Value Date/Time  NA 143 04/05/2024 0900   K 4.1 04/05/2024 0900   CL 108 04/05/2024 0900   CO2 25 04/05/2024 0900   BUN 11 04/05/2024 0900   CREATININE 0.77 04/05/2024 0900      Component Value Date/Time   CALCIUM 9.0 04/05/2024 0900   ALKPHOS 110 04/05/2024 0900   AST 17 04/05/2024 0900   ALT 12 04/05/2024 0900   BILITOT 0.6 04/05/2024 0900       Impression and Plan: Ms. Sanks is a very nice 63 year old white female.  She had been seen by us  in the past because of iron deficiency.  Now, she certainly has more issues.  She has his bony pain which is severe.  It mostly bothers her in her right lower leg.  Again, the bone marrow biopsy was normal.  We will see if the Aredia might help.  I do think this would be worthwhile trying for her.  We will see about having this next week.  I told her if it worked, she should notice a difference within a week.  Again, I think that a Rheumatology referral may not be a bad idea for her.  We will have her come back for the Aredia.  I will see her probably back in about a month or so.  If she feels better, then we could only do the Aredia monthly or every 3 months.   Maude JONELLE Crease, MD 11/20/202510:02 AM

## 2024-04-09 LAB — GENECONNECT MOLECULAR SCREEN: Genetic Analysis Overall Interpretation: NEGATIVE

## 2024-04-09 LAB — SURGICAL PATHOLOGY

## 2024-04-10 ENCOUNTER — Inpatient Hospital Stay

## 2024-04-10 VITALS — BP 142/71 | HR 70 | Temp 98.4°F | Resp 16 | Wt 215.0 lb

## 2024-04-10 DIAGNOSIS — M8080XA Other osteoporosis with current pathological fracture, unspecified site, initial encounter for fracture: Secondary | ICD-10-CM

## 2024-04-10 DIAGNOSIS — M898X9 Other specified disorders of bone, unspecified site: Secondary | ICD-10-CM | POA: Diagnosis not present

## 2024-04-10 MED ORDER — SODIUM CHLORIDE 0.9 % IV SOLN
90.0000 mg | Freq: Once | INTRAVENOUS | Status: AC
  Administered 2024-04-10: 90 mg via INTRAVENOUS
  Filled 2024-04-10: qty 30

## 2024-04-10 MED ORDER — SODIUM CHLORIDE 0.9 % IV SOLN: INTRAVENOUS | Status: DC

## 2024-04-10 NOTE — Patient Instructions (Signed)
 Pamidronate Injection What is this medication? PAMIDRONATE (pa mi DROE nate) treats high calcium levels in the blood caused by cancer. It may also be used with chemotherapy to treat weakened bones caused by cancer. It can also be used to treat Paget's disease of the bone. It works by slowing down the release of calcium from bones. This lowers calcium levels in your blood. It also makes your bones stronger and less likely to break (fracture). It belongs to a group of medications called bisphosphonates. This medicine may be used for other purposes; ask your health care provider or pharmacist if you have questions. COMMON BRAND NAME(S): Aredia What should I tell my care team before I take this medication? They need to know if you have any of these conditions: Bleeding disorder Cancer Dental disease Kidney disease Low levels of calcium or other minerals in the blood Low red blood cell counts Receiving steroids, such as dexamethasone or prednisone An unusual or allergic reaction to pamidronate, other medications, foods, dyes or preservatives Pregnant or trying to get pregnant Breast-feeding How should I use this medication? This medication is injected into a vein. It is given by your care team in a hospital or clinic setting. Talk to your care team about the use of this medication in children. Special care may be needed. Overdosage: If you think you have taken too much of this medicine contact a poison control center or emergency room at once. NOTE: This medicine is only for you. Do not share this medicine with others. What if I miss a dose? Keep appointments for follow-up doses. It is important not to miss your dose. Call your care team if you are unable to keep an appointment. What may interact with this medication? Certain antibiotics given by injection Medications for inflammation or pain, such as ibuprofen, naproxen Some diuretics, such as bumetanide, furosemide Cyclosporine Parathyroid  hormone Tacrolimus Teriparatide Thalidomide This list may not describe all possible interactions. Give your health care provider a list of all the medicines, herbs, non-prescription drugs, or dietary supplements you use. Also tell them if you smoke, drink alcohol, or use illegal drugs. Some items may interact with your medicine. What should I watch for while using this medication? Visit your care team for regular checks on your progress. It may be some time before you see the benefit from this medication. Some people who take this medication have severe bone, joint, or muscle pain. This medication may also increase your risk for jaw problems or a broken thigh bone. Tell your care team right away if you have severe pain in your jaw, bones, joints, or muscles. Tell your care team if you have any pain that does not go away or that gets worse. Tell your dentist and dental surgeon that you are taking this medication. You should not have major dental surgery while on this medication. See your dentist to have a dental exam and fix any dental problems before starting this medication. Take good care of your teeth while on this medication. Make sure you see your dentist for regular follow-up appointments. You should make sure you get enough calcium and vitamin D while you are taking this medication. Discuss the foods you eat and the vitamins you take with your care team. You may need bloodwork while you are taking this medication. Talk to your care team if you wish to become pregnant or think you might be pregnant. This medication can cause serious birth defects. What side effects may I notice from receiving  this medication? Side effects that you should report to your care team as soon as possible: Allergic reactions--skin rash, itching, hives, swelling of the face, lips, tongue, or throat Kidney injury--decrease in the amount of urine, swelling of the ankles, hands, or feet Low calcium level--muscle pain or  cramps, confusion, tingling, or numbness in the hands or feet Osteonecrosis of the jaw--pain, swelling, or redness in the mouth, numbness of the jaw, poor healing after dental work, unusual discharge from the mouth, visible bones in the mouth Severe bone, joint, or muscle pain Side effects that usually do not require medical attention (report to your care team if they continue or are bothersome): Constipation Fatigue Fever Loss of appetite Nausea Pain, redness, or irritation at injection site Stomach pain This list may not describe all possible side effects. Call your doctor for medical advice about side effects. You may report side effects to FDA at 1-800-FDA-1088. Where should I keep my medication? This medication is given in a hospital or clinic. It will not be stored at home. NOTE: This sheet is a summary. It may not cover all possible information. If you have questions about this medicine, talk to your doctor, pharmacist, or health care provider.  2024 Elsevier/Gold Standard (2021-06-22 00:00:00)

## 2024-04-10 NOTE — Progress Notes (Signed)
 Pt. instructed to stop fosamax weekly per Dr. Timmy. Pt verbalized understanding.

## 2024-04-10 NOTE — Progress Notes (Signed)
 Patient taking Fosamax 70mg  weekly. Last dose on 04/04/24. Ok to proceed with Pamidronate  90mg  today.  Bridgett Leach [place], [place], BCPS, BCOP 04/10/2024. 11:28 AM

## 2024-04-15 ENCOUNTER — Other Ambulatory Visit

## 2024-04-17 ENCOUNTER — Ambulatory Visit
Admission: RE | Admit: 2024-04-17 | Discharge: 2024-04-17 | Disposition: A | Source: Ambulatory Visit | Attending: Neurosurgery | Admitting: Neurosurgery

## 2024-04-17 DIAGNOSIS — M47816 Spondylosis without myelopathy or radiculopathy, lumbar region: Secondary | ICD-10-CM

## 2024-05-23 ENCOUNTER — Inpatient Hospital Stay: Admitting: Hematology & Oncology

## 2024-05-23 ENCOUNTER — Ambulatory Visit: Payer: Self-pay | Admitting: Hematology & Oncology

## 2024-05-23 ENCOUNTER — Inpatient Hospital Stay: Attending: Hematology & Oncology

## 2024-05-23 ENCOUNTER — Encounter: Payer: Self-pay | Admitting: Hematology & Oncology

## 2024-05-23 VITALS — BP 160/89 | HR 68 | Temp 98.9°F | Resp 20 | Ht 64.0 in | Wt 217.0 lb

## 2024-05-23 DIAGNOSIS — R109 Unspecified abdominal pain: Secondary | ICD-10-CM | POA: Diagnosis not present

## 2024-05-23 DIAGNOSIS — M545 Low back pain, unspecified: Secondary | ICD-10-CM

## 2024-05-23 DIAGNOSIS — G8929 Other chronic pain: Secondary | ICD-10-CM

## 2024-05-23 DIAGNOSIS — M898X9 Other specified disorders of bone, unspecified site: Secondary | ICD-10-CM | POA: Insufficient documentation

## 2024-05-23 DIAGNOSIS — M8080XA Other osteoporosis with current pathological fracture, unspecified site, initial encounter for fracture: Secondary | ICD-10-CM

## 2024-05-23 DIAGNOSIS — G9511 Acute infarction of spinal cord (embolic) (nonembolic): Secondary | ICD-10-CM | POA: Insufficient documentation

## 2024-05-23 DIAGNOSIS — E611 Iron deficiency: Secondary | ICD-10-CM | POA: Insufficient documentation

## 2024-05-23 LAB — CBC WITH DIFFERENTIAL (CANCER CENTER ONLY)
Abs Immature Granulocytes: 0.05 K/uL (ref 0.00–0.07)
Basophils Absolute: 0.1 K/uL (ref 0.0–0.1)
Basophils Relative: 1 %
Eosinophils Absolute: 0.2 K/uL (ref 0.0–0.5)
Eosinophils Relative: 2 %
HCT: 43 % (ref 36.0–46.0)
Hemoglobin: 13.5 g/dL (ref 12.0–15.0)
Immature Granulocytes: 1 %
Lymphocytes Relative: 21 %
Lymphs Abs: 1.9 K/uL (ref 0.7–4.0)
MCH: 27.8 pg (ref 26.0–34.0)
MCHC: 31.4 g/dL (ref 30.0–36.0)
MCV: 88.5 fL (ref 80.0–100.0)
Monocytes Absolute: 0.9 K/uL (ref 0.1–1.0)
Monocytes Relative: 10 %
Neutro Abs: 5.9 K/uL (ref 1.7–7.7)
Neutrophils Relative %: 65 %
Platelet Count: 359 K/uL (ref 150–400)
RBC: 4.86 MIL/uL (ref 3.87–5.11)
RDW: 12.9 % (ref 11.5–15.5)
WBC Count: 8.9 K/uL (ref 4.0–10.5)
nRBC: 0 % (ref 0.0–0.2)

## 2024-05-23 LAB — CMP (CANCER CENTER ONLY)
ALT: 11 U/L (ref 0–44)
AST: 18 U/L (ref 15–41)
Albumin: 4.6 g/dL (ref 3.5–5.0)
Alkaline Phosphatase: 124 U/L (ref 38–126)
Anion gap: 11 (ref 5–15)
BUN: 12 mg/dL (ref 8–23)
CO2: 24 mmol/L (ref 22–32)
Calcium: 9.3 mg/dL (ref 8.9–10.3)
Chloride: 107 mmol/L (ref 98–111)
Creatinine: 0.69 mg/dL (ref 0.44–1.00)
GFR, Estimated: >60 mL/min
Glucose, Bld: 114 mg/dL — ABNORMAL HIGH (ref 70–99)
Potassium: 4.6 mmol/L (ref 3.5–5.1)
Sodium: 142 mmol/L (ref 135–145)
Total Bilirubin: 0.4 mg/dL (ref 0.0–1.2)
Total Protein: 7.6 g/dL (ref 6.5–8.1)

## 2024-05-23 LAB — VITAMIN D 25 HYDROXY (VIT D DEFICIENCY, FRACTURES): Vit D, 25-Hydroxy: 19.5 ng/mL — ABNORMAL LOW (ref 30–100)

## 2024-05-23 LAB — SEDIMENTATION RATE: Sed Rate: 15 mm/h (ref 0–22)

## 2024-05-23 LAB — LACTATE DEHYDROGENASE: LDH: 184 U/L (ref 105–235)

## 2024-05-23 NOTE — Progress Notes (Signed)
 " Hematology and Oncology Follow Up Visit  [name] [id number] [birth year] 64 y.o. 05/23/2024   Principle Diagnosis:  Severe bony pain  Current Therapy:   Observation     Interim History:  Ms. [name] is back for her follow-up.  Unfortunately, she is still having a lot of problems with bony pain.  She says her left leg started to hurt very badly again.  She says her arms are starting to swell up again.  When she was last here, we gave her some Aredia  which I thought might help.  However this really did not help.  I still have no explanation as to all as to why she is having all of this pain.  She says pain medication does not help.  When we did a PET scan on her back in August, there was 1 active lymph node in the periportal area.  Again I am unsure if this is any something significant.  Regardless, we will have to repeat the PET scan to see if there is any change.  If there is a change, we will going to have to somehow get the lymph node biopsied.  She is yet to see a Rheumatologist.  She really does need to see a rheumatologist in my opinion.  She is having all this bony pain.  I do not know if a rheumatologist might be able to help.  Again, a bone biopsy might be helpful.  Again, I do not know if Orthopedic Surgery we do this.  She has had no fever.  She has had no rashes.  She has had no bleeding.  She did have an MRI of the lumbar spine back in early December.  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  There appears to be some cord flattening at L1-L2.SABRA  She sees a Midwife in early February.  Again I am not sure how this would be related to her pain.  Overall, I would say that her performance status is probably ECOG 1.   Medications:  Current Outpatient Medications:    albuterol  (PROVENTIL ) 2 MG tablet, Take 4 mg by mouth every 8 (eight) hours., Disp: , Rfl:    busPIRone (BUSPAR) 15 MG tablet, , Disp: , Rfl:    celecoxib  (CELEBREX ) 200 MG capsule,  Take 1 capsule (200 mg total) by mouth every 12 (twelve) hours. Take along with Pepcid  to help avoid GI upset., Disp: 60 capsule, Rfl: 0   diazepam  (VALIUM ) 5 MG tablet, Take 1 tablet (5 mg total) by mouth every 6 (six) hours as needed (vertigo)., Disp: 20 tablet, Rfl: 0   EPIPEN  2-PAK 0.3 MG/0.3ML SOAJ injection, Inject 0.3 mg as directed as needed (allergic reaction). , Disp: , Rfl:    HYDROmorphone  (DILAUDID ) 2 MG tablet, Take by mouth. (Patient taking differently: Take by mouth every 6 (six) hours as needed.), Disp: , Rfl:    ibuprofen  (ADVIL ) 200 MG tablet, Take 400 mg by mouth every 8 (eight) hours as needed (pain.)., Disp: , Rfl:    methocarbamol  (ROBAXIN ) 500 MG tablet, Take 500 mg by mouth 2 (two) times daily as needed for muscle spasms., Disp: , Rfl:    metoCLOPramide  (REGLAN ) 10 MG tablet, Take 1 tablet (10 mg total) by mouth 3 (three) times daily with meals. (Patient taking differently: Take 10 mg by mouth every 8 (eight) hours as needed.), Disp: 90 tablet, Rfl: 1   ondansetron  (ZOFRAN ) 8 MG tablet, Take 8 mg by mouth 3 (  three) times daily as needed for nausea or vomiting., Disp: , Rfl:    rOPINIRole  (REQUIP ) 1 MG tablet, Take 1 mg by mouth at bedtime., Disp: , Rfl:    rosuvastatin (CRESTOR) 5 MG tablet, Take 5 mg by mouth daily., Disp: , Rfl:    SUMAtriptan  (IMITREX ) 50 MG tablet, TAKE 1 TABLET BY MOUTH AS NEEDED FOR MIGRAINE. MAY REPEAT IN 2 HOURS IF HEADACHE PERSISTS OR RECURS., Disp: 10 tablet, Rfl: 1   naloxone (NARCAN) nasal spray 4 mg/0.1 mL, SQUIRT INTO ONE NOSTRIL IF NEEDED FOR OVER-SEDATION FROM NARCOTICS, MAY REPEAT DOSE 2 TO 3 MINUTES IN OTHER NOSTRIL IF NEEDED. (Patient not taking: Reported on 05/23/2024), Disp: , Rfl:    pantoprazole  (PROTONIX ) 40 MG tablet, Take 1 tablet (40 mg total) by mouth 2 (two) times daily. (Patient not taking: Reported on 05/23/2024), Disp: 180 tablet, Rfl: 3   propranolol  ER (INDERAL  LA) 60 MG 24 hr capsule, Take 1 capsule (60 mg total) by mouth daily.  (Patient not taking: Reported on 05/23/2024), Disp: 90 capsule, Rfl: 3  Allergies:  Allergies  Allergen Reactions   Fish Allergy Anaphylaxis    To all fish, not just shellfish.  Cannot tolerate even the smell of seafood without her airway swelling/closing.  Fish (substance)   Other Shortness Of Breath    Whipped cream and pudding   Nortriptyline Other (See Comments)    hallucinations   Bee Venom Hives   Flexeril [Cyclobenzaprine Hcl] Other (See Comments)    Intense anger   Tylenol  [Acetaminophen ] Other (See Comments)    Cannot take due to past liver failure    Past Medical History, Surgical history, Social history, and Family History were reviewed and updated.  Review of Systems: Review of Systems  Constitutional:  Positive for unexpected weight change.  HENT:  Negative.    Eyes: Negative.   Respiratory: Negative.    Cardiovascular: Negative.   Gastrointestinal: Negative.   Endocrine: Negative.   Genitourinary: Negative.    Musculoskeletal:  Positive for arthralgias and myalgias.  Neurological: Negative.   Hematological: Negative.   Psychiatric/Behavioral: Negative.      Physical Exam:  height is 5' 4 (1.626 m) and weight is 217 lb (98.4 kg). Her oral temperature is 98.9 F (37.2 C). Her blood pressure is 160/89 (abnormal) and her pulse is 68. Her respiration is 20 and oxygen saturation is 98%.   Wt Readings from Last 3 Encounters:  05/23/24 217 lb (98.4 kg)  04/10/24 215 lb (97.5 kg)  04/05/24 215 lb (97.5 kg)    Physical Exam Vitals reviewed.  HENT:     Head: Normocephalic and atraumatic.  Eyes:     Pupils: Pupils are equal, round, and reactive to light.  Cardiovascular:     Rate and Rhythm: Normal rate and regular rhythm.     Heart sounds: Normal heart sounds.  Pulmonary:     Effort: Pulmonary effort is normal.     Breath sounds: Normal breath sounds.  Abdominal:     General: Bowel sounds are normal.     Palpations: Abdomen is soft.  Musculoskeletal:         General: No tenderness or deformity. Normal range of motion.     Cervical back: Normal range of motion.  Lymphadenopathy:     Cervical: No cervical adenopathy.  Skin:    General: Skin is warm and dry.     Findings: No erythema or rash.  Neurological:     Mental Status: She is alert and oriented  to person, place, and time.  Psychiatric:        Behavior: Behavior normal.        Thought Content: Thought content normal.        Judgment: Judgment normal.      Lab Results  Component Value Date   WBC 8.9 05/23/2024   HGB 13.5 05/23/2024   HCT 43.0 05/23/2024   MCV 88.5 05/23/2024   PLT 359 05/23/2024     Chemistry      Component Value Date/Time   NA 143 04/05/2024 0900   K 4.1 04/05/2024 0900   CL 108 04/05/2024 0900   CO2 25 04/05/2024 0900   BUN 11 04/05/2024 0900   CREATININE 0.77 04/05/2024 0900      Component Value Date/Time   CALCIUM 9.0 04/05/2024 0900   ALKPHOS 110 04/05/2024 0900   AST 17 04/05/2024 0900   ALT 12 04/05/2024 0900   BILITOT 0.6 04/05/2024 0900       Impression and Plan: Ms. Heinle is a very nice 64 year old white female.  She had been seen by us  in the past because of iron deficiency.  Now, she certainly has more issues.  She has this bony pain which is severe.  It mostly bothers her in her left lower leg.  Again, the bone marrow biopsy was normal.  Again, I do not know if the issues on the MRI might be significant.  I know that Dr. Mavis of Neurosurgery would certainly know this.  Again we will see about a PET scan.  We will see if there is any change in this periportal lymph node.  If so, if there is more activity, somehow, we are going to have to try to get it removed.  I would like to see her back in about 6 weeks.  By then, she would have seen her other doctors.   Maude JONELLE Crease, MD 1/7/202612:41 PM "

## 2024-05-23 NOTE — Progress Notes (Signed)
 BP remains elevated, 160/89. Instructed to monitor at home daily and keep a diary.  Notify PCP if it remains over 140/90. Verbalized understanding.

## 2024-05-24 ENCOUNTER — Other Ambulatory Visit: Payer: Self-pay

## 2024-05-24 ENCOUNTER — Encounter: Payer: Self-pay | Admitting: Hematology & Oncology

## 2024-05-24 DIAGNOSIS — M8080XA Other osteoporosis with current pathological fracture, unspecified site, initial encounter for fracture: Secondary | ICD-10-CM

## 2024-05-24 NOTE — Telephone Encounter (Signed)
 Pt advised and states understanding. Future order added.

## 2024-05-24 NOTE — Telephone Encounter (Signed)
 Spoke with pt who states she has been taking OTC D3 5000 iu x 2 months. Per Dr Timmy ok to continue doing that for awhile and we can retest later. LM for pt to CB- no ROI on file.

## 2024-06-06 ENCOUNTER — Other Ambulatory Visit: Payer: Self-pay

## 2024-06-06 ENCOUNTER — Telehealth: Payer: Self-pay

## 2024-06-06 ENCOUNTER — Encounter (HOSPITAL_COMMUNITY)
Admission: RE | Admit: 2024-06-06 | Discharge: 2024-06-06 | Disposition: A | Discharge status: Home / Self Care | Source: Ambulatory Visit | Attending: Hematology & Oncology | Admitting: Hematology & Oncology

## 2024-06-06 DIAGNOSIS — R9389 Abnormal findings on diagnostic imaging of other specified body structures: Secondary | ICD-10-CM | POA: Insufficient documentation

## 2024-06-06 DIAGNOSIS — R109 Unspecified abdominal pain: Secondary | ICD-10-CM | POA: Insufficient documentation

## 2024-06-06 LAB — GLUCOSE, CAPILLARY: Glucose-Capillary: 87 mg/dL (ref 70–99)

## 2024-06-06 MED ORDER — FLUDEOXYGLUCOSE F - 18 (FDG) INJECTION
11.2400 | Freq: Once | INTRAVENOUS | Status: AC | PRN
  Administered 2024-06-06: 11.24 via INTRAVENOUS

## 2024-06-06 NOTE — Telephone Encounter (Signed)
 Call received from [facility] requesting clarification on PET scan order for patient. Per Dr. Timmy, patient to receive PET Whole Body.

## 2024-06-11 ENCOUNTER — Ambulatory Visit: Payer: Self-pay | Admitting: Hematology & Oncology

## 2024-06-12 NOTE — Progress Notes (Signed)
 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  Need to f/u on that.  Jeralyn

## 2024-06-19 ENCOUNTER — Ambulatory Visit: Payer: Self-pay | Admitting: Gastroenterology

## 2024-07-05 ENCOUNTER — Inpatient Hospital Stay

## 2024-07-05 ENCOUNTER — Inpatient Hospital Stay: Admitting: Hematology & Oncology

## 2024-12-06 ENCOUNTER — Ambulatory Visit: Admitting: Neurology
